# Patient Record
Sex: Female | Born: 1935 | Race: Black or African American | Hispanic: No | Smoking: Never smoker
Health system: Southern US, Community
[De-identification: ages and names within clinical notes are randomized; demographics above are authoritative.]

## PROBLEM LIST (undated history)

## (undated) DIAGNOSIS — M1990 Unspecified osteoarthritis, unspecified site: Secondary | ICD-10-CM

## (undated) DIAGNOSIS — Z950 Presence of cardiac pacemaker: Secondary | ICD-10-CM

## (undated) DIAGNOSIS — F0390 Unspecified dementia without behavioral disturbance: Secondary | ICD-10-CM

## (undated) HISTORY — PX: CARPAL TUNNEL RELEASE: SHX101

## (undated) HISTORY — DX: Unspecified osteoarthritis, unspecified site: M19.90

## (undated) HISTORY — DX: Presence of cardiac pacemaker: Z95.0

## (undated) HISTORY — PX: KNEE ARTHROPLASTY: SHX992

---

## 2010-05-20 HISTORY — PX: ATRIAL CARDIAC PACEMAKER INSERTION: SHX561

## 2010-12-27 DIAGNOSIS — F0390 Unspecified dementia without behavioral disturbance: Secondary | ICD-10-CM | POA: Insufficient documentation

## 2011-05-21 HISTORY — PX: BRAIN SURGERY: SHX531

## 2013-05-06 DIAGNOSIS — I495 Sick sinus syndrome: Secondary | ICD-10-CM | POA: Insufficient documentation

## 2014-04-17 ENCOUNTER — Emergency Department (HOSPITAL_COMMUNITY): Payer: Medicare Other

## 2014-04-17 ENCOUNTER — Emergency Department (HOSPITAL_COMMUNITY)
Admission: EM | Admit: 2014-04-17 | Discharge: 2014-04-18 | Disposition: A | Discharge status: Home / Self Care | Payer: Medicare Other | Attending: Emergency Medicine | Admitting: Emergency Medicine

## 2014-04-17 ENCOUNTER — Encounter (HOSPITAL_COMMUNITY): Payer: Self-pay | Admitting: Emergency Medicine

## 2014-04-17 DIAGNOSIS — S0990XA Unspecified injury of head, initial encounter: Secondary | ICD-10-CM | POA: Diagnosis present

## 2014-04-17 DIAGNOSIS — S0093XA Contusion of unspecified part of head, initial encounter: Secondary | ICD-10-CM | POA: Diagnosis not present

## 2014-04-17 DIAGNOSIS — Y92128 Other place in nursing home as the place of occurrence of the external cause: Secondary | ICD-10-CM | POA: Insufficient documentation

## 2014-04-17 DIAGNOSIS — Z950 Presence of cardiac pacemaker: Secondary | ICD-10-CM | POA: Insufficient documentation

## 2014-04-17 DIAGNOSIS — Y998 Other external cause status: Secondary | ICD-10-CM | POA: Diagnosis not present

## 2014-04-17 DIAGNOSIS — Y9389 Activity, other specified: Secondary | ICD-10-CM | POA: Diagnosis not present

## 2014-04-17 DIAGNOSIS — F0390 Unspecified dementia without behavioral disturbance: Secondary | ICD-10-CM | POA: Insufficient documentation

## 2014-04-17 DIAGNOSIS — Z79899 Other long term (current) drug therapy: Secondary | ICD-10-CM | POA: Insufficient documentation

## 2014-04-17 DIAGNOSIS — W19XXXA Unspecified fall, initial encounter: Secondary | ICD-10-CM | POA: Diagnosis not present

## 2014-04-17 HISTORY — DX: Unspecified dementia, unspecified severity, without behavioral disturbance, psychotic disturbance, mood disturbance, and anxiety: F03.90

## 2014-04-17 LAB — URINALYSIS, ROUTINE W REFLEX MICROSCOPIC
BILIRUBIN URINE: NEGATIVE
Glucose, UA: NEGATIVE mg/dL
Hgb urine dipstick: NEGATIVE
KETONES UR: NEGATIVE mg/dL
Leukocytes, UA: NEGATIVE
Nitrite: NEGATIVE
PROTEIN: NEGATIVE mg/dL
Specific Gravity, Urine: 1.021 (ref 1.005–1.030)
Urobilinogen, UA: 1 mg/dL (ref 0.0–1.0)
pH: 6 (ref 5.0–8.0)

## 2014-04-17 LAB — DIFFERENTIAL
BASOS PCT: 0 % (ref 0–1)
Basophils Absolute: 0 10*3/uL (ref 0.0–0.1)
EOS ABS: 0.1 10*3/uL (ref 0.0–0.7)
Eosinophils Relative: 2 % (ref 0–5)
Lymphocytes Relative: 23 % (ref 12–46)
Lymphs Abs: 1.3 10*3/uL (ref 0.7–4.0)
MONOS PCT: 10 % (ref 3–12)
Monocytes Absolute: 0.6 10*3/uL (ref 0.1–1.0)
NEUTROS PCT: 65 % (ref 43–77)
Neutro Abs: 3.7 10*3/uL (ref 1.7–7.7)

## 2014-04-17 LAB — BASIC METABOLIC PANEL
Anion gap: 12 (ref 5–15)
BUN: 30 mg/dL — AB (ref 6–23)
CHLORIDE: 97 meq/L (ref 96–112)
CO2: 25 mEq/L (ref 19–32)
CREATININE: 1 mg/dL (ref 0.50–1.10)
Calcium: 9.2 mg/dL (ref 8.4–10.5)
GFR, EST AFRICAN AMERICAN: 56 mL/min — AB (ref >90)
GFR, EST NON AFRICAN AMERICAN: 48 mL/min — AB (ref >90)
Glucose, Bld: 118 mg/dL — ABNORMAL HIGH (ref 70–99)
Potassium: 4.4 mEq/L (ref 3.7–5.3)
Sodium: 134 mEq/L — ABNORMAL LOW (ref 137–147)

## 2014-04-17 LAB — CBC
HCT: 35.6 % — ABNORMAL LOW (ref 36.0–46.0)
HEMOGLOBIN: 11.4 g/dL — AB (ref 12.0–15.0)
MCH: 30.7 pg (ref 26.0–34.0)
MCHC: 32 g/dL (ref 30.0–36.0)
MCV: 96 fL (ref 78.0–100.0)
Platelets: 199 10*3/uL (ref 150–400)
RBC: 3.71 MIL/uL — ABNORMAL LOW (ref 3.87–5.11)
RDW: 14.3 % (ref 11.5–15.5)
WBC: 5.8 10*3/uL (ref 4.0–10.5)

## 2014-04-17 LAB — TROPONIN I: Troponin I: <0.3 ng/mL (ref <0.30)

## 2014-04-17 NOTE — ED Notes (Signed)
Patient has 2" skin tear to left forearm. Cleaned with wound cleanser, non adherent dressing and gauze applied.

## 2014-04-17 NOTE — ED Notes (Addendum)
[facility]- Per EMS, pt had unwitnessed fall today. Per facility, pt was found sitting on the floor. Pt denies pain or injury. Pt has no evidence of injury or trauma. Pt sent for eval per policy. Pt does not take blood thinners. Pt at baseline per staff. Pt in NAD

## 2014-04-17 NOTE — ED Notes (Signed)
Called [facility] for update on ETA. Dispatcher unable to give ETA at this time. "[facility] calls to [facility]. I can't see where patient is on list". Patient made aware of same.

## 2014-04-17 NOTE — Discharge Instructions (Signed)
Fall Prevention and Home Safety °Falls cause injuries and can affect all age groups. It is possible to use preventive measures to significantly decrease the likelihood of falls. There are many simple measures which can make your home safer and prevent falls. °OUTDOORS °· Repair cracks and edges of walkways and driveways. °· Remove high doorway thresholds. °· Trim shrubbery on the main path into your home. °· Have good outside lighting. °· Clear walkways of tools, rocks, debris, and clutter. °· Check that handrails are not broken and are securely fastened. Both sides of steps should have handrails. °· Have leaves, snow, and ice cleared regularly. °· Use sand or salt on walkways during winter months. °· In the garage, clean up grease or oil spills. °BATHROOM °· Install night lights. °· Install grab bars by the toilet and in the tub and shower. °· Use non-skid mats or decals in the tub or shower. °· Place a plastic non-slip stool in the shower to sit on, if needed. °· Keep floors dry and clean up all water on the floor immediately. °· Remove soap buildup in the tub or shower on a regular basis. °· Secure bath mats with non-slip, double-sided rug tape. °· Remove throw rugs and tripping hazards from the floors. °BEDROOMS °· Install night lights. °· Make sure a bedside light is easy to reach. °· Do not use oversized bedding. °· Keep a telephone by your bedside. °· Have a firm chair with side arms to use for getting dressed. °· Remove throw rugs and tripping hazards from the floor. °KITCHEN °· Keep handles on pots and pans turned toward the center of the stove. Use back burners when possible. °· Clean up spills quickly and allow time for drying. °· Avoid walking on wet floors. °· Avoid hot utensils and knives. °· Position shelves so they are not too high or low. °· Place commonly used objects within easy reach. °· If necessary, use a sturdy step stool with a grab bar when reaching. °· Keep electrical cables out of the  way. °· Do not use floor polish or wax that makes floors slippery. If you must use wax, use non-skid floor wax. °· Remove throw rugs and tripping hazards from the floor. °STAIRWAYS °· Never leave objects on stairs. °· Place handrails on both sides of stairways and use them. Fix any loose handrails. Make sure handrails on both sides of the stairways are as long as the stairs. °· Check carpeting to make sure it is firmly attached along stairs. Make repairs to worn or loose carpet promptly. °· Avoid placing throw rugs at the top or bottom of stairways, or properly secure the rug with carpet tape to prevent slippage. Get rid of throw rugs, if possible. °· Have an electrician put in a light switch at the top and bottom of the stairs. °OTHER FALL PREVENTION TIPS °· Wear low-heel or rubber-soled shoes that are supportive and fit well. Wear closed toe shoes. °· When using a stepladder, make sure it is fully opened and both spreaders are firmly locked. Do not climb a closed stepladder. °· Add color or contrast paint or tape to grab bars and handrails in your home. Place contrasting color strips on first and last steps. °· Learn and use mobility aids as needed. Install an electrical emergency response system. °· Turn on lights to avoid dark areas. Replace light bulbs that burn out immediately. Get light switches that glow. °· Arrange furniture to create clear pathways. Keep furniture in the same place. °·   Firmly attach carpet with non-skid or double-sided tape. °· Eliminate uneven floor surfaces. °· Select a carpet pattern that does not visually hide the edge of steps. °· Be aware of all pets. °OTHER HOME SAFETY TIPS °· Set the water temperature for 120° F (48.8° C). °· Keep emergency numbers on or near the telephone. °· Keep smoke detectors on every level of the home and near sleeping areas. °Document Released: 04/26/2002 Document Revised: 11/05/2011 Document Reviewed: 07/26/2011 °Exit[employer]® Patient Information ©2015  [employer]. This information is not intended to replace advice given to you by your health care provider. Make sure you discuss any questions you have with your health care provider. ° °Facial or Scalp Contusion °A facial or scalp contusion is a deep bruise on the face or head. Injuries to the face and head generally cause a lot of swelling, especially around the eyes. Contusions are the result of an injury that caused bleeding under the skin. The contusion may turn blue, purple, or yellow. Minor injuries will give you a painless contusion, but more severe contusions may stay painful and swollen for a few weeks.  °CAUSES  °A facial or scalp contusion is caused by a blunt injury or trauma to the face or head area.  °SIGNS AND SYMPTOMS  °· Swelling of the injured area.   °· Discoloration of the injured area.   °· Tenderness, soreness, or pain in the injured area.   °DIAGNOSIS  °The diagnosis can be made by taking a medical history and doing a physical exam. An X-ray exam, CT scan, or MRI may be needed to determine if there are any associated injuries, such as broken bones (fractures). °TREATMENT  °Often, the best treatment for a facial or scalp contusion is applying cold compresses to the injured area. Over-the-counter medicines may also be recommended for pain control.  °HOME CARE INSTRUCTIONS  °· Only take over-the-counter or prescription medicines as directed by your health care provider.   °· Apply ice to the injured area.   °¨ Put ice in a plastic bag.   °¨ Place a towel between your skin and the bag.   °¨ Leave the ice on for 20 minutes, 2-3 times a day.   °SEEK MEDICAL CARE IF: °· You have bite problems.   °· You have pain with chewing.   °· You are concerned about facial defects. °SEEK IMMEDIATE MEDICAL CARE IF: °· You have severe pain or a headache that is not relieved by medicine.   °· You have unusual sleepiness, confusion, or personality changes.   °· You throw up (vomit).   °· You have a persistent  nosebleed.   °· You have double vision or blurred vision.   °· You have fluid drainage from your nose or ear.   °· You have difficulty walking or using your arms or legs.   °MAKE SURE YOU:  °· Understand these instructions. °· Will watch your condition. °· Will get help right away if you are not doing well or get worse. °Document Released: 06/13/2004 Document Revised: 02/24/2013 Document Reviewed: 12/17/2012 °ExitCare® Patient Information ©2015 [employer]. This information is not intended to replace advice given to you by your health care provider. Make sure you discuss any questions you have with your health care provider. ° °

## 2014-04-17 NOTE — ED Provider Notes (Signed)
CSN: [id number]     Arrival date & time 04/17/14  1508 History   First MD Initiated Contact with Patient 04/17/14 1543     Chief Complaint  Patient presents with  . Fall     (Consider location/radiation/quality/duration/timing/severity/associated sxs/prior Treatment) HPI Comments: 80 yo female with hx of dementia who presents after a fall while at her nursing facility.  Unwitnessed.  Pt denies complaint currently.  Her granddaughter states she is more confused than she normally is.  For example, she thought her granddaughter was her daughter and she did not remember her grandson's name.  No fevers, no cough, no chest pain, no SOB.    Patient is a 80 y.o. female presenting with fall.  Fall This is a new problem. Episode onset: today. Episode frequency: once. The problem has been resolved. Associated symptoms comments: Headache, confusion. Nothing aggravates the symptoms. Nothing relieves the symptoms.    Past Medical History  Diagnosis Date  . Dementia    Past Surgical History  Procedure Laterality Date  . Atrial cardiac pacemaker insertion    . Knee arthroplasty Bilateral   . Carpal tunnel release Bilateral    No family history on file. History  Substance Use Topics  . Smoking status: Never Smoker   . Smokeless tobacco: Never Used  . Alcohol Use: No   OB History    No data available     Review of Systems  All other systems reviewed and are negative.     Allergies  Benzodiazepines  Home Medications   Prior to Admission medications   Medication Sig Start Date End Date Taking? Authorizing Provider  ALPRAZolam (XANAX) 0.25 MG tablet Take 0.25 mg by mouth every 6 (six) hours as needed for anxiety (agitation).   Yes Historical Provider, MD  cholecalciferol (VITAMIN D) 1000 UNITS tablet Take 1,000 Units by mouth daily.   Yes Historical Provider, MD  clotrimazole (LOTRIMIN) 1 % cream Apply 1 application topically 2 (two) times daily.   Yes Historical Provider, MD    Memantine HCl ER (NAMENDA XR) 28 MG CP24 Take 28 mg by mouth daily.   Yes Historical Provider, MD  sertraline (ZOLOFT) 25 MG tablet Take 25 mg by mouth daily.   Yes Historical Provider, MD   BP 129/65 mmHg  Pulse 68  Temp(Src) 98.2 F (36.8 C) (Oral)  Resp 18  SpO2 96% Physical Exam  Constitutional: She is oriented to person, place, and time. She appears well-developed and well-nourished. No distress.  HENT:  Head: Normocephalic and atraumatic.    Mouth/Throat: Oropharynx is clear and moist.  Eyes: Conjunctivae are normal. Pupils are equal, round, and reactive to light. No scleral icterus.  Neck: Neck supple.  Cardiovascular: Normal rate, regular rhythm, normal heart sounds and intact distal pulses.   No murmur heard. Pulmonary/Chest: Effort normal and breath sounds normal. No stridor. No respiratory distress. She has no rales.  Abdominal: Soft. Bowel sounds are normal. She exhibits no distension. There is no tenderness.  Musculoskeletal: Normal range of motion.  Neurological: She is alert and oriented to person, place, and time.  Skin: Skin is warm and dry. No rash noted.     Psychiatric: She has a normal mood and affect. Her behavior is normal.  Nursing note and vitals reviewed.   ED Course  Procedures (including critical care time) Labs Review Labs Reviewed  BASIC METABOLIC PANEL - Abnormal; Notable for the following:    Sodium 134 (*)    Glucose, Bld 118 (*)  BUN 30 (*)    GFR calc non Af Amer 48 (*)    GFR calc Af Amer 56 (*)    All other components within normal limits  CBC - Abnormal; Notable for the following:    RBC 3.71 (*)    Hemoglobin 11.4 (*)    HCT 35.6 (*)    All other components within normal limits  URINALYSIS, ROUTINE W REFLEX MICROSCOPIC  DIFFERENTIAL  TROPONIN I  CBC WITH DIFFERENTIAL    Imaging Review Ct Head Wo Contrast  04/17/2014   CLINICAL DATA:  Unwitnessed fall today at a nursing facility. Found sitting on the floor. No pain or  visible trauma.  EXAM: CT HEAD WITHOUT CONTRAST  CT CERVICAL SPINE WITHOUT CONTRAST  TECHNIQUE: Multidetector CT imaging of the head and cervical spine was performed following the standard protocol without intravenous contrast. Multiplanar CT image reconstructions of the cervical spine were also generated.  COMPARISON:  None.  FINDINGS: CT HEAD FINDINGS  Diffusely enlarged ventricles and subarachnoid spaces. Patchy white matter low density in both cerebral hemispheres. Small left posterolateral water density hygroma, with a maximum thickness of 6 mm. Small amount of punctate calcific density in the anterior aspects of both middle cranial fossae and in the sella turcica. Small amount of similar density in the left posterior fossa. No skull fracture, intracranial hemorrhage or paranasal sinus air-fluid levels. Mild right maxillary sinus mucosal thickening. Left frontal bone burr hole.  CT CERVICAL SPINE FINDINGS  Mild dextroconvex cervical scoliosis. Degenerative changes throughout the cervical and upper thoracic spine. These include extensive facet degenerative changes with associated grade 1 retrolisthesis at the C3-4 level and C4-5 level and grade 1 anterolisthesis at the C6-7 and C7-T1 levels. No prevertebral soft tissue swelling or fractures.  IMPRESSION: 1. No skull fracture or intracranial hemorrhage. 2. No cervical spine fracture or traumatic subluxation. 3. Moderate diffuse cerebral and cerebellar atrophy. 4. Moderate chronic small vessel white matter ischemic changes in both cerebral hemispheres. 5. 6 mm thick left posterolateral subdural hygroma and left frontal burr hole. 6. Cervical spine degenerative changes, as described above   Electronically Signed   By: Gordan Payment M.D.   On: 04/17/2014 17:54   Ct Cervical Spine Wo Contrast  04/17/2014   CLINICAL DATA:  Unwitnessed fall today at a nursing facility. Found sitting on the floor. No pain or visible trauma.  EXAM: CT HEAD WITHOUT CONTRAST  CT CERVICAL  SPINE WITHOUT CONTRAST  TECHNIQUE: Multidetector CT imaging of the head and cervical spine was performed following the standard protocol without intravenous contrast. Multiplanar CT image reconstructions of the cervical spine were also generated.  COMPARISON:  None.  FINDINGS: CT HEAD FINDINGS  Diffusely enlarged ventricles and subarachnoid spaces. Patchy white matter low density in both cerebral hemispheres. Small left posterolateral water density hygroma, with a maximum thickness of 6 mm. Small amount of punctate calcific density in the anterior aspects of both middle cranial fossae and in the sella turcica. Small amount of similar density in the left posterior fossa. No skull fracture, intracranial hemorrhage or paranasal sinus air-fluid levels. Mild right maxillary sinus mucosal thickening. Left frontal bone burr hole.  CT CERVICAL SPINE FINDINGS  Mild dextroconvex cervical scoliosis. Degenerative changes throughout the cervical and upper thoracic spine. These include extensive facet degenerative changes with associated grade 1 retrolisthesis at the C3-4 level and C4-5 level and grade 1 anterolisthesis at the C6-7 and C7-T1 levels. No prevertebral soft tissue swelling or fractures.  IMPRESSION: 1. No skull fracture or  intracranial hemorrhage. 2. No cervical spine fracture or traumatic subluxation. 3. Moderate diffuse cerebral and cerebellar atrophy. 4. Moderate chronic small vessel white matter ischemic changes in both cerebral hemispheres. 5. 6 mm thick left posterolateral subdural hygroma and left frontal burr hole. 6. Cervical spine degenerative changes, as described above   Electronically Signed   By: Gordan PaymentSteve  Reid M.D.   On: 04/17/2014 17:54  All radiology studies independently viewed by me.      EKG Interpretation   Date/Time:  Sunday April 17 2014 15:20:54 EST Ventricular Rate:  62 PR Interval:  154 QRS Duration: 122 QT Interval:  439 QTC Calculation: 446 R Axis:   -5 Text Interpretation:   Sinus rhythm Right bundle branch block No old  tracing to compare Confirmed by [facility]  MD, TREY ([id number]) on 04/17/2014  4:34:30 PM      MDM   Final diagnoses:  Fall  Head contusion, initial encounter    80 yo female who presented after an unwitnessed fall at her nursing facility.  She complained of headache and her granddaughter felt that she was more confused than usual.  Her imaging was negative for acute intracranial or cervical spine injuries.  Her blood work and urinalysis were reassuring.  Her repeat exams were reassuring.  She was able to walk with a walker.  She appeared stable for discharge back to her nursing facility.  I discussed return precautions with her granddaughter.     Warnell Foresterrey Dornell Grasmick, MD 04/18/14 [phone]

## 2014-04-17 NOTE — ED Notes (Signed)
Pt asking if mom is aware she is here, pt seems confused

## 2014-08-24 ENCOUNTER — Emergency Department (HOSPITAL_BASED_OUTPATIENT_CLINIC_OR_DEPARTMENT_OTHER)
Admission: EM | Admit: 2014-08-24 | Discharge: 2014-08-24 | Disposition: A | Discharge status: Home / Self Care | Payer: Medicare Other | Attending: Emergency Medicine | Admitting: Emergency Medicine

## 2014-08-24 ENCOUNTER — Encounter (HOSPITAL_BASED_OUTPATIENT_CLINIC_OR_DEPARTMENT_OTHER): Payer: Self-pay

## 2014-08-24 ENCOUNTER — Emergency Department (HOSPITAL_BASED_OUTPATIENT_CLINIC_OR_DEPARTMENT_OTHER): Payer: Medicare Other

## 2014-08-24 DIAGNOSIS — K6289 Other specified diseases of anus and rectum: Secondary | ICD-10-CM

## 2014-08-24 DIAGNOSIS — K648 Other hemorrhoids: Secondary | ICD-10-CM | POA: Diagnosis not present

## 2014-08-24 DIAGNOSIS — Z79899 Other long term (current) drug therapy: Secondary | ICD-10-CM | POA: Diagnosis not present

## 2014-08-24 DIAGNOSIS — Z950 Presence of cardiac pacemaker: Secondary | ICD-10-CM | POA: Insufficient documentation

## 2014-08-24 DIAGNOSIS — F0390 Unspecified dementia without behavioral disturbance: Secondary | ICD-10-CM | POA: Diagnosis not present

## 2014-08-24 LAB — COMPREHENSIVE METABOLIC PANEL
ALT: 9 U/L (ref 0–35)
AST: 17 U/L (ref 0–37)
Albumin: 3.3 g/dL — ABNORMAL LOW (ref 3.5–5.2)
Alkaline Phosphatase: 83 U/L (ref 39–117)
Anion gap: 7 (ref 5–15)
BILIRUBIN TOTAL: 0.5 mg/dL (ref 0.3–1.2)
BUN: 25 mg/dL — ABNORMAL HIGH (ref 6–23)
CHLORIDE: 105 mmol/L (ref 96–112)
CO2: 28 mmol/L (ref 19–32)
CREATININE: 0.77 mg/dL (ref 0.50–1.10)
Calcium: 8.5 mg/dL (ref 8.4–10.5)
GFR, EST AFRICAN AMERICAN: 83 mL/min — AB (ref >90)
GFR, EST NON AFRICAN AMERICAN: 71 mL/min — AB (ref >90)
GLUCOSE: 89 mg/dL (ref 70–99)
Potassium: 3.7 mmol/L (ref 3.5–5.1)
Sodium: 140 mmol/L (ref 135–145)
Total Protein: 6.4 g/dL (ref 6.0–8.3)

## 2014-08-24 LAB — CBC WITH DIFFERENTIAL/PLATELET
BASOS ABS: 0 10*3/uL (ref 0.0–0.1)
Basophils Relative: 1 % (ref 0–1)
EOS PCT: 2 % (ref 0–5)
Eosinophils Absolute: 0.1 10*3/uL (ref 0.0–0.7)
HEMATOCRIT: 33.3 % — AB (ref 36.0–46.0)
Hemoglobin: 10.5 g/dL — ABNORMAL LOW (ref 12.0–15.0)
Lymphocytes Relative: 35 % (ref 12–46)
Lymphs Abs: 1.7 10*3/uL (ref 0.7–4.0)
MCH: 30.4 pg (ref 26.0–34.0)
MCHC: 31.5 g/dL (ref 30.0–36.0)
MCV: 96.5 fL (ref 78.0–100.0)
Monocytes Absolute: 0.5 10*3/uL (ref 0.1–1.0)
Monocytes Relative: 10 % (ref 3–12)
NEUTROS ABS: 2.5 10*3/uL (ref 1.7–7.7)
Neutrophils Relative %: 52 % (ref 43–77)
Platelets: 154 10*3/uL (ref 150–400)
RBC: 3.45 MIL/uL — ABNORMAL LOW (ref 3.87–5.11)
RDW: 15.2 % (ref 11.5–15.5)
WBC: 4.8 10*3/uL (ref 4.0–10.5)

## 2014-08-24 LAB — URINALYSIS, ROUTINE W REFLEX MICROSCOPIC
Bilirubin Urine: NEGATIVE
Glucose, UA: NEGATIVE mg/dL
Hgb urine dipstick: NEGATIVE
Ketones, ur: 15 mg/dL — AB
Leukocytes, UA: NEGATIVE
NITRITE: NEGATIVE
Protein, ur: NEGATIVE mg/dL
Specific Gravity, Urine: 1.019 (ref 1.005–1.030)
UROBILINOGEN UA: 0.2 mg/dL (ref 0.0–1.0)
pH: 7 (ref 5.0–8.0)

## 2014-08-24 LAB — LIPASE, BLOOD: Lipase: 17 U/L (ref 11–59)

## 2014-08-24 MED ORDER — ONDANSETRON HCL 4 MG/2ML IJ SOLN: 4.0000 mg | Freq: Once | INTRAMUSCULAR | Status: DC

## 2014-08-24 MED ORDER — SODIUM CHLORIDE 0.9 % IV SOLN: INTRAVENOUS | Status: DC

## 2014-08-24 MED ORDER — CIPROFLOXACIN HCL 250 MG PO TABS: 250.0000 mg | ORAL_TABLET | Freq: Two times a day (BID) | ORAL | Status: DC

## 2014-08-24 MED ORDER — METRONIDAZOLE 500 MG PO TABS: 500.0000 mg | ORAL_TABLET | Freq: Two times a day (BID) | ORAL | Status: DC

## 2014-08-24 MED ORDER — TRAMADOL HCL 50 MG PO TABS: 50.0000 mg | ORAL_TABLET | Freq: Four times a day (QID) | ORAL | Status: DC | PRN

## 2014-08-24 MED ORDER — IOHEXOL 300 MG/ML  SOLN
100.0000 mL | Freq: Once | INTRAMUSCULAR | Status: AC | PRN
  Administered 2014-08-24: 100 mL via INTRAVENOUS

## 2014-08-24 MED ORDER — SODIUM CHLORIDE 0.9 % IV BOLUS (SEPSIS)
250.0000 mL | Freq: Once | INTRAVENOUS | Status: AC
  Administered 2014-08-24: 250 mL via INTRAVENOUS

## 2014-08-24 MED ORDER — IOHEXOL 300 MG/ML  SOLN
50.0000 mL | Freq: Once | INTRAMUSCULAR | Status: AC | PRN
  Administered 2014-08-24: 50 mL via ORAL

## 2014-08-24 NOTE — ED Notes (Signed)
Pt drinking PO contrast

## 2014-08-24 NOTE — Discharge Instructions (Signed)
Follow-up with her geriatric doctor as scheduled. Return for any new or worse symptoms. Take pain medicine as needed.

## 2014-08-24 NOTE — ED Provider Notes (Signed)
CT scan shows proctitis. We'll treat with antibiotics. Refer to GI. Family aware that patient needs colonoscopy as this may represent tumor as well.   Sherri OctaveStephen Normand Damron, MD 08/24/14 [phone]

## 2014-08-24 NOTE — ED Notes (Signed)
Onset of buttock pain 6 days ago and progressively worsening.  Caregiver reports she attended an adult day care, complained to staff that her "butt hurt". Denies recent fall or injury, bowel or bladder incontinence, however at one point stated pain radiated down her leg.  Normal BM.  She was evaluated and [facility] ago with a normal exam/ diagnostics.  Caregiver also reports urinary frequency yesterday and today.

## 2014-08-24 NOTE — ED Provider Notes (Signed)
CSN: [id number]     Arrival date & time 08/24/14  1008 History   First MD Initiated Contact with Patient 08/24/14 1124     Chief Complaint  Patient presents with  . Rectal Pain     (Consider location/radiation/quality/duration/timing/severity/associated sxs/prior Treatment) The history is provided by a relative. The history is limited by the condition of the patient.   level V caveat applies to the patient's history she has significant dementia. All information provided by patient's granddaughter.  Patient with onset of complaint of rectal pain on Thursday. The pain is worse with sitting and with standing but is able to walk without pain. Able to lay down without pain. No history of any fall or injury. Patient was evaluated at [facility] emergency department on Saturday had rectal exam which is noted to have stool impaction. Acute abdominal series was done without evidence of any bony abnormalities to the back or any significant accumulation of stool or evidence of any bowel obstruction.  Since that time patient is pain has persisted family is not sure why. In addition they've noted that she's had urinary frequency the past 2 days.  Past Medical History  Diagnosis Date  . Dementia    Past Surgical History  Procedure Laterality Date  . Atrial cardiac pacemaker insertion    . Knee arthroplasty Bilateral   . Carpal tunnel release Bilateral    No family history on file. History  Substance Use Topics  . Smoking status: Never Smoker   . Smokeless tobacco: Never Used  . Alcohol Use: No   OB History    No data available     Review of Systems  Unable to perform ROS HENT: Negative.   Gastrointestinal: Positive for rectal pain.  Psychiatric/Behavioral: Positive for confusion.   05 caveat applies to the review of systems due to significant dementia.    Allergies  Benzodiazepines  Home Medications   Prior to Admission medications   Medication Sig Start Date End Date Taking?  Authorizing Provider  cholecalciferol (VITAMIN D) 1000 UNITS tablet Take 1,000 Units by mouth daily.    Historical Provider, MD  clotrimazole (LOTRIMIN) 1 % cream Apply 1 application topically 2 (two) times daily.    Historical Provider, MD  Memantine HCl ER (NAMENDA XR) 28 MG CP24 Take 28 mg by mouth daily.    Historical Provider, MD  sertraline (ZOLOFT) 25 MG tablet Take 25 mg by mouth daily.    Historical Provider, MD   BP 156/54 mmHg  Pulse 75  Temp(Src) 98 F (36.7 C) (Oral)  Resp 16  Ht  (1.651 m)  Wt 125 lb (56.7 kg)  BMI 20.80 kg/m2  SpO2 99% Physical Exam  Constitutional: She is oriented to person, place, and time. She appears well-developed and well-nourished. No distress.  HENT:  Head: Normocephalic and atraumatic.  Mouth/Throat: Oropharynx is clear and moist.  Eyes: Conjunctivae and EOM are normal. Pupils are equal, round, and reactive to light.  Neck: Normal range of motion.  Cardiovascular: Normal rate, regular rhythm and normal heart sounds.   No murmur heard. Pulmonary/Chest: Effort normal and breath sounds normal. No respiratory distress.  Abdominal: Soft. Bowel sounds are normal. There is no tenderness.  Genitourinary: No vaginal discharge found.  Rectal exam no mass no fecal impaction. No pain. No gross blood. Single prolapsed internal hemorrhoid without bleeding. No external hemorrhoids no fissure.  Musculoskeletal: Normal range of motion.  Neurological: She is alert and oriented to person, place, and time. No cranial nerve deficit.  She exhibits normal muscle tone. Coordination normal.  Skin: Skin is warm. No rash noted.  Nursing note and vitals reviewed.   ED Course  Procedures (including critical care time) Labs Review Labs Reviewed  URINALYSIS, ROUTINE W REFLEX MICROSCOPIC - Abnormal; Notable for the following:    APPearance CLOUDY (*)    Ketones, ur 15 (*)    All other components within normal limits  COMPREHENSIVE METABOLIC PANEL - Abnormal;  Notable for the following:    BUN 25 (*)    Albumin 3.3 (*)    GFR calc non Af Amer 71 (*)    GFR calc Af Amer 83 (*)    All other components within normal limits  CBC WITH DIFFERENTIAL/PLATELET - Abnormal; Notable for the following:    RBC 3.45 (*)    Hemoglobin 10.5 (*)    HCT 33.3 (*)    All other components within normal limits  LIPASE, BLOOD   Results for orders placed or performed during the hospital encounter of 08/24/14  Urinalysis, Routine w reflex microscopic  Result Value Ref Range   Color, Urine YELLOW YELLOW   APPearance CLOUDY (A) CLEAR   Specific Gravity, Urine 1.019 1.005 - 1.030   pH 7.0 5.0 - 8.0   Glucose, UA NEGATIVE NEGATIVE mg/dL   Hgb urine dipstick NEGATIVE NEGATIVE   Bilirubin Urine NEGATIVE NEGATIVE   Ketones, ur 15 (A) NEGATIVE mg/dL   Protein, ur NEGATIVE NEGATIVE mg/dL   Urobilinogen, UA 0.2 0.0 - 1.0 mg/dL   Nitrite NEGATIVE NEGATIVE   Leukocytes, UA NEGATIVE NEGATIVE  Comprehensive metabolic panel  Result Value Ref Range   Sodium 140 135 - 145 mmol/L   Potassium 3.7 3.5 - 5.1 mmol/L   Chloride 105 96 - 112 mmol/L   CO2 28 19 - 32 mmol/L   Glucose, Bld 89 70 - 99 mg/dL   BUN 25 (H) 6 - 23 mg/dL   Creatinine, Ser 1.610.77 0.50 - 1.10 mg/dL   Calcium 8.5 8.4 - 09.610.5 mg/dL   Total Protein 6.4 6.0 - 8.3 g/dL   Albumin 3.3 (L) 3.5 - 5.2 g/dL   AST 17 0 - 37 U/L   ALT 9 0 - 35 U/L   Alkaline Phosphatase 83 39 - 117 U/L   Total Bilirubin 0.5 0.3 - 1.2 mg/dL   GFR calc non Af Amer 71 (L) >90 mL/min   GFR calc Af Amer 83 (L) >90 mL/min   Anion gap 7 5 - 15  Lipase, blood  Result Value Ref Range   Lipase 17 11 - 59 U/L  CBC with Differential/Platelet  Result Value Ref Range   WBC 4.8 4.0 - 10.5 K/uL   RBC 3.45 (L) 3.87 - 5.11 MIL/uL   Hemoglobin 10.5 (L) 12.0 - 15.0 g/dL   HCT 04.533.3 (L) 40.936.0 - 81.146.0 %   MCV 96.5 78.0 - 100.0 fL   MCH 30.4 26.0 - 34.0 pg   MCHC 31.5 30.0 - 36.0 g/dL   RDW 91.415.2 78.211.5 - 95.615.5 %   Platelets 154 150 - 400 K/uL    Neutrophils Relative % 52 43 - 77 %   Neutro Abs 2.5 1.7 - 7.7 K/uL   Lymphocytes Relative 35 12 - 46 %   Lymphs Abs 1.7 0.7 - 4.0 K/uL   Monocytes Relative 10 3 - 12 %   Monocytes Absolute 0.5 0.1 - 1.0 K/uL   Eosinophils Relative 2 0 - 5 %   Eosinophils Absolute 0.1 0.0 - 0.7 K/uL  Basophils Relative 1 0 - 1 %   Basophils Absolute 0.0 0.0 - 0.1 K/uL  \  Imaging Review No results found.   EKG Interpretation None      MDM   Final diagnoses:  Rectal pain    Patient with almost one-week history of complaint of pain in the rectal area. Worse with standing or sitting but will walk without pain. Also will lay flat without complaint of pain. No history of any fall. Patient does have significant dementia. Patient brought in by granddaughter who is her caretaker.  Patient was evaluated at [facility] ED on Saturday notes from there were obtained. Patient had acute abdominal series which was negative for any significant findings. No evidence of any bowel obstruction or any significant constipation. However all rectal exam they stated that she was impacted. Patient was not sent home with any stool softeners. Was sent home with follow-up with her primary care doctor but unfortunately she no longer followed by that doctor. Patient continued to have the pain.  Here today of rectal vault is no stool in it. Some evidence of a little bit of a prolapsed internal hemorrhoid no external hemorrhoids no fissure. No pain on rectal examination.  Patient lab workup without significant abnormalities no leukocytosis no liver function test and a mallet lipase is normal. Urinalysis is negative family members stated that seemed to be having urinary frequency of the past few days and that the odor with strong. But no evidence of urinary tract infection today.  CT scan is pending for evaluation of the pelvic area will also evaluate the pelvis and the hip bones as well.  If negative patient can be discharged  home with pain control. Will write a prescription for tramadol.  Patient's family member is working on finding her geriatric doctor she does have follow-up in April with them.    Vanetta Mulders, MD 08/24/14 1539

## 2014-08-24 NOTE — ED Notes (Signed)
Pt returned from CT.  Pt in NAD.

## 2014-08-24 NOTE — ED Notes (Signed)
Unable to obtain urine specimen, EDP informed.

## 2014-08-24 NOTE — ED Notes (Signed)
MD at bedside. 

## 2014-08-24 NOTE — ED Notes (Signed)
Family at bedside. 

## 2014-08-25 ENCOUNTER — Telehealth: Payer: Self-pay | Admitting: Internal Medicine

## 2014-08-25 ENCOUNTER — Telehealth (HOSPITAL_BASED_OUTPATIENT_CLINIC_OR_DEPARTMENT_OTHER): Payer: Self-pay | Admitting: *Deleted

## 2014-08-25 NOTE — Telephone Encounter (Signed)
Pt was seen in the ER for rectal pain. Pt was diagnosed with proctitis and told to follow-up with GI for possible colonoscopy. Pt scheduled to see Willette ClusterPaula Guenther NP 08/30/14@1 :30pm. Pt aware of appt.

## 2014-08-29 NOTE — ED Notes (Signed)
Granddaughter of patient called to state that the patient is still in pain and is concerned that she did not get in to see the GI doctor in 2 days.  Reinforced the discharge instructions and the use of antibiotics to decrease the inflammation to improve the visual ability for the GI doctor.  Reviewed pain medication instructions and encouraged to supplement with additional tylenol bid, as well as use of hot water bottle to the lower abdomen.  Encouraged to come back to the ed if pain persist or feels any new symptoms.

## 2014-08-30 ENCOUNTER — Ambulatory Visit (INDEPENDENT_AMBULATORY_CARE_PROVIDER_SITE_OTHER): Payer: Medicare Other | Admitting: Nurse Practitioner

## 2014-08-30 ENCOUNTER — Encounter: Payer: Self-pay | Admitting: Nurse Practitioner

## 2014-08-30 VITALS — BP 134/80 | HR 64 | Ht 65.0 in | Wt 124.0 lb

## 2014-08-30 DIAGNOSIS — K6289 Other specified diseases of anus and rectum: Secondary | ICD-10-CM | POA: Diagnosis not present

## 2014-08-30 DIAGNOSIS — R933 Abnormal findings on diagnostic imaging of other parts of digestive tract: Secondary | ICD-10-CM | POA: Diagnosis not present

## 2014-08-30 NOTE — Progress Notes (Addendum)
  HPI :   Patient is a 81-year-old female referred by emergency department physician (Dr. Stephen Rancour) for evaluation of rectal pain and an abnormal CT scan. Patient is here with her granddaughter who describes sudden onset of rectal pain last week. Patient complained of pain when standing or sitting but no pain with defecation. No rectal bleeding. As the granddaughter knows patient had not been constipated or having any diarrhea. Patient has dementia, history is provided by the granddaughter.   Patient was taken to the emergency room in [place], sounds like she was diagnosed with constipation / fecal impaction.  For continued pain patient went to [place] 08/24/14 where CT scan of the abdomen and pelvis with contrast suggested proctitis versus rectal mass. CBC pertinent for hemoglobin of 10.5, down from 11.4 late November. No other major labwork abnormalities.   Patient has continued to complain of rectal pain, she is taking Cipro and Flagyl as prescribed by the emergency department  Past Medical History  Diagnosis Date  . Dementia     Family History  Problem Relation Age of Onset  . Colon cancer Son   . Colon polyps Neg Hx   . Heart attack Son   . Diabetes Neg Hx   . Kidney disease Neg Hx    History  Substance Use Topics  . Smoking status: Never Smoker   . Smokeless tobacco: Never Used  . Alcohol Use: No   Current Outpatient Prescriptions  Medication Sig Dispense Refill  . cholecalciferol (VITAMIN D) 1000 UNITS tablet Take 1,000 Units by mouth daily.    . Cholecalciferol (VITAMIN D3) 1000 UNITS CHEW Chew by mouth daily.     . ciprofloxacin (CIPRO) 250 MG tablet Take 1 tablet (250 mg total) by mouth 2 (two) times daily. 20 tablet 0  . clotrimazole (LOTRIMIN) 1 % cream Apply 1 application topically 2 (two) times daily.    . Memantine HCl ER (NAMENDA XR) 28 MG CP24 Take 28 mg by mouth daily.    . metroNIDAZOLE (FLAGYL) 500 MG tablet Take 1 tablet (500 mg total) by mouth 2  (two) times daily. 20 tablet 0  . sertraline (ZOLOFT) 25 MG tablet Take 25 mg by mouth daily.    . traMADol (ULTRAM) 50 MG tablet Take 1 tablet (50 mg total) by mouth every 6 (six) hours as needed. 15 tablet 0   No current facility-administered medications for this visit.   Allergies  Allergen Reactions  . Benzodiazepines      Review of Systems: Positive for anxiety, confusion, sleeping problems and swelling of the feet and legs. All other systems reviewed and negative except where noted in HPI.    Ct Abdomen Pelvis W Contrast  08/24/2014   CLINICAL DATA:  Rectal pain for 6 days, normal bowel movements, history dementia  EXAM: CT ABDOMEN AND PELVIS WITH CONTRAST  TECHNIQUE: Multidetector CT imaging of the abdomen and pelvis was performed using the standard protocol following bolus administration of intravenous contrast. Sagittal and coronal MPR images reconstructed from axial data set.  CONTRAST:  50mL OMNIPAQUE IOHEXOL 300 MG/ML SOLN PO, 100mL OMNIPAQUE IOHEXOL 300 MG/ML SOLN IV  COMPARISON:  None  FINDINGS: Minimal atelectasis or scarring at RIGHT lung base.  Pacemaker leads RIGHT atrium and RIGHT ventricle.  Calcified granulomata within liver and spleen.  Gallbladder surgically absent.  Liver, spleen, atrophic pancreas, kidneys, and adrenal glands otherwise normal.  Stomach and small bowel loops grossly unremarkable.  Rectal wall thickening which could represent proctitis or tumor.    Remainder of colon significant only for sigmoid diverticulosis.  No mass, adenopathy, free air, or free fluid.  Uterus nonvisualized with grossly normal sized ovaries.  Appendix not identified.  Bones demineralized.  IMPRESSION: Rectal wall thickening which could root inflated to proctitis or tumor; correlation are proctoscopy recommended.  Old granulomatous disease.  Sigmoid diverticulosis.  No other definite intra-abdominal or intrapelvic abnormalities.   Electronically Signed   By: Mark  Boles M.D.   On: 08/24/2014  16:30    Physical Exam: BP 134/80 mmHg  Pulse 64  Ht 5' 5" (1.651 m)  Wt 124 lb (56.246 kg)  BMI 20.63 kg/m2 Constitutional: Pleasant,well-developed, black female in no acute distress. HEENT: Normocephalic and atraumatic. Conjunctivae are normal. No scleral icterus. Neck supple.  Cardiovascular: Normal rate, regular rhythm.  Pulmonary/chest: Effort normal and breath sounds normal. No wheezing, rales or rhonchi. Abdominal: Soft, nondistended, nontender. Bowel sounds active throughout. There are no masses palpable. No hepatomegaly. Rectal: no external lesions or fissures seen. Large amount of soft, light brown, heme negative stool in vault Extremities: no edema Lymphadenopathy: No cervical adenopathy noted. Neurological: Alert , pleasantly confused at times.  Skin: Skin is warm and dry. No rashes noted. Psychiatric: Normal mood and affect. Behavior is normal.   ASSESSMENT AND PLAN:  1. Pleasant 81-year-old female with dementia referred by emergency department physician for rectal pain and proctitis on CT scan. Pain started acutely last week. No associated diarrhea. Granddaughter is not aware of any constipation problems, grandmother is having bowel movements. No rectal bleeding. White count normal, abdominal exam benign. There is a large amount of soft stool in the vault precluding adequate DRE. At this point I think bowels need to be evacuated to rule out constipation as source of pain. Granddaughter will administer enemas tonight and call us tomorrow with a condition update. Spoke with granddaughter about further workup (? Sigmoidoscopy), especially if pain does not improve with resolution of constipation. Patient will continue Cipro and Flagyl in the event that this is infectious though she does not have the typical symptoms associated with infectious proctitis  2. Dementia  CC: Stephen Rancour, MD  Addendum: Reviewed and agree with initial management.  Plan flex sigmoidoscopy on  09/12/14 to eval persistent rectal pain Jay M Pyrtle, MD     

## 2014-08-30 NOTE — Patient Instructions (Signed)
Get Saline Enemas at the pharmacy, or [facility].   Give her one tonight- 4-12. If no results ( stool)  give another one 2 hours later.  Call us in 1-2  Days later with an update on her rectal pain and bowl movements.  Ask for the nurse, Rene Kocheregina . If she is not available, ask for Lister Brizzi.

## 2014-08-31 ENCOUNTER — Encounter: Payer: Self-pay | Admitting: Nurse Practitioner

## 2014-09-02 ENCOUNTER — Telehealth: Payer: Self-pay | Admitting: Nurse Practitioner

## 2014-09-02 DIAGNOSIS — K6289 Other specified diseases of anus and rectum: Secondary | ICD-10-CM

## 2014-09-02 NOTE — Telephone Encounter (Signed)
Sherri Leonard, Will forward to Dr. [name] who will be her GI. He may prefer daughter to give a couple of enemas and come for flex or at least anoscopy rather than be hospitalized for prep.

## 2014-09-02 NOTE — Telephone Encounter (Signed)
Spoke with the granddaughter. The patient is having regular bowel movements. No constipation, but continues to have pain in the rectal area. The family does want to go forward with the flex sig. Granddaughter is uncertain if she can safely prep the patient at home due to her limited mobility and ambulation. She is interested in putting the patient in the hospital overnight for the prep. Please advise.

## 2014-09-06 ENCOUNTER — Encounter: Payer: Self-pay | Admitting: Nurse Practitioner

## 2014-09-06 ENCOUNTER — Ambulatory Visit (INDEPENDENT_AMBULATORY_CARE_PROVIDER_SITE_OTHER): Payer: Medicare Other | Admitting: Nurse Practitioner

## 2014-09-06 ENCOUNTER — Encounter: Payer: Self-pay | Admitting: *Deleted

## 2014-09-06 VITALS — BP 126/70 | HR 67 | Temp 97.9°F | Resp 16 | Ht 65.0 in | Wt 119.0 lb

## 2014-09-06 DIAGNOSIS — K6289 Other specified diseases of anus and rectum: Secondary | ICD-10-CM

## 2014-09-06 DIAGNOSIS — R269 Unspecified abnormalities of gait and mobility: Secondary | ICD-10-CM | POA: Diagnosis not present

## 2014-09-06 DIAGNOSIS — F0391 Unspecified dementia with behavioral disturbance: Secondary | ICD-10-CM

## 2014-09-06 DIAGNOSIS — F411 Generalized anxiety disorder: Secondary | ICD-10-CM

## 2014-09-06 DIAGNOSIS — R001 Bradycardia, unspecified: Secondary | ICD-10-CM

## 2014-09-06 DIAGNOSIS — F03918 Unspecified dementia, unspecified severity, with other behavioral disturbance: Secondary | ICD-10-CM | POA: Insufficient documentation

## 2014-09-06 MED ORDER — ZOSTER VACCINE LIVE 19400 UNT/0.65ML ~~LOC~~ SOLR: 0.6500 mL | Freq: Once | SUBCUTANEOUS | Status: DC

## 2014-09-06 MED ORDER — SERTRALINE HCL 25 MG PO TABS: 25.0000 mg | ORAL_TABLET | Freq: Every day | ORAL | Status: DC

## 2014-09-06 MED ORDER — MEMANTINE HCL ER 28 MG PO CP24: 28.0000 mg | ORAL_CAPSULE | Freq: Every day | ORAL | Status: DC

## 2014-09-06 NOTE — Patient Instructions (Signed)
STOP aleve  Start tylenol 325 mg tablet 1-2 tablet every 6 hours as needed for pain  Complete antibiotic- take with food to help prevent GI upset  Ensure/resourse or other nutritional supplement 1-2 times daily, may mix with peanut butter or yogurt

## 2014-09-06 NOTE — Progress Notes (Signed)
Patient ID: Sherri Leonard, female   DOB: 03/01/1921, 81 y.o.   MRN: [id number]    PCP: Sharon SellerEUBANKS, JESSICA K, NP  Allergies  Allergen Reactions  . Ativan [Lorazepam] Other (See Comments)    Causes violence   . Benzodiazepines     Chief Complaint  Patient presents with  . Establish Care    New patient establish care: weight loss, seen in ER x 2 in the last weeks for rectal pain. Seen [facility]. Fasting if any labs due  . Posture Concerns    Recommendation/referral for home health, patient has several needs- higher walker to prevent hunch back, toilet seat elevation, and overall assesment.      HPI: Patient is a 81 y.o. female seen in the office today to establish care. Has been seen several places but granddaughter reported she did not have routine care and therefore is transferring her here for management.  Seeing GI due to rectal pain. Mass vs inflammation found CT. No constipation. Since she has been taking cipro and flagyl and pain has improved. Decreased appetite when she was in pain.  Never had colonoscopy.  Questions medications Pt was on Aricept in the past- it was changed to namenda at some point- granddaughter unsure why. Did not have any problems with medication.  Granddaughter has been taking care of pt for 8 years. Mobility has been slowing down. Using 4 wheel walker and bending over walker- walker unable to be adjusted at this time Has had multiple falls in the last 6 months. Granddaughter really wanting some therapy to assess the home.    Advanced Directive information Does patient have an advance directive?: Yes Review of Systems:  Review of Systems  Constitutional: Positive for appetite change (still she has been having rectal pain) and unexpected weight change (weight loss). Negative for activity change.  Respiratory: Negative for shortness of breath.   Cardiovascular: Positive for leg swelling (mild). Negative for chest pain.  Gastrointestinal: Positive for  rectal pain. Negative for nausea, abdominal pain, diarrhea, constipation and abdominal distention.  Genitourinary: Negative for difficulty urinating.  Musculoskeletal: Negative.   Skin: Negative.   Neurological: Negative for dizziness.  Psychiatric/Behavioral: Positive for behavioral problems, confusion and agitation.    Past Medical History  Diagnosis Date  . Dementia   . Pacemaker   . OA (osteoarthritis)    Past Surgical History  Procedure Laterality Date  . Atrial cardiac pacemaker insertion    . Knee arthroplasty Bilateral   . Carpal tunnel release Bilateral   . Brain surgery  2013   Social History:   reports that she has never smoked. She has never used smokeless tobacco. She reports that she does not drink alcohol or use illicit drugs.  Family History  Problem Relation Age of Onset  . Heart disease Son   . Cancer Son 60    on Liver  . Colon polyps Neg Hx   . Diabetes Neg Hx   . Kidney disease Neg Hx   . Heart attack Son   . Alzheimer's disease Mother   . Alzheimer's disease Father     Medications: Patient's Medications  New Prescriptions   No medications on file  Previous Medications   CHOLECALCIFEROL (VITAMIN D) 1000 UNITS TABLET    Take 1,000 Units by mouth daily.   CIPROFLOXACIN (CIPRO) 250 MG TABLET    Take 1 tablet (250 mg total) by mouth 2 (two) times daily.   MEMANTINE HCL ER (NAMENDA XR) 28 MG CP24    Take  28 mg by mouth daily.   METRONIDAZOLE (FLAGYL) 500 MG TABLET    Take 1 tablet (500 mg total) by mouth 2 (two) times daily.   NAPROXEN SODIUM (ANAPROX) 220 MG TABLET    Take 220 mg by mouth as needed.   SERTRALINE (ZOLOFT) 25 MG TABLET    Take 25 mg by mouth daily.   TRAMADOL (ULTRAM) 50 MG TABLET    Take 1 tablet (50 mg total) by mouth every 6 (six) hours as needed.  Modified Medications   Modified Medication Previous Medication   ZOSTER VACCINE LIVE, PF, (ZOSTAVAX) 19147 UNT/0.65ML INJECTION zoster vaccine live, PF, (ZOSTAVAX) 82956 UNT/0.65ML  injection      Inject 19,400 Units into the skin once.    Inject 0.65 mLs into the skin once.  Discontinued Medications   CHOLECALCIFEROL (VITAMIN D3) 1000 UNITS CHEW    Chew by mouth daily.    CLOTRIMAZOLE (LOTRIMIN) 1 % CREAM    Apply 1 application topically 2 (two) times daily.     Physical Exam:  Filed Vitals:   09/06/14 0844  BP: 126/70  Pulse: 67  Temp: 97.9 F (36.6 C)  TempSrc: Oral  Resp: 16  Height:  (1.651 m)  Weight: 119 lb (53.978 kg)  SpO2: 91%    Physical Exam  Constitutional: No distress.  Frail elderly female  HENT:  Head: Normocephalic and atraumatic.  Mouth/Throat: Oropharynx is clear and moist. No oropharyngeal exudate.  Eyes: Conjunctivae and EOM are normal. Pupils are equal, round, and reactive to light.  Cardiovascular: Normal rate, regular rhythm and normal heart sounds.   Pulmonary/Chest: Effort normal and breath sounds normal.  Abdominal: Soft. Bowel sounds are normal. She exhibits no distension. There is no tenderness.  Musculoskeletal: She exhibits edema (trace). She exhibits no tenderness.  Neurological: She is alert.  Skin: Skin is warm and dry. She is not diaphoretic.  Psychiatric: She has a normal mood and affect.    Labs reviewed: Basic Metabolic Panel:  Recent Labs  21/30/86 1655 08/24/14 1440  NA 134* 140  K 4.4 3.7  CL 97 105  CO2 25 28  GLUCOSE 118* 89  BUN 30* 25*  CREATININE 1.00 0.77  CALCIUM 9.2 8.5   Liver Function Tests:  Recent Labs  08/24/14 1440  AST 17  ALT 9  ALKPHOS 83  BILITOT 0.5  PROT 6.4  ALBUMIN 3.3*    Recent Labs  08/24/14 1440  LIPASE 17   No results for input(s): AMMONIA in the last 8760 hours. CBC:  Recent Labs  04/17/14 1655 08/24/14 1440  WBC 5.8 4.8  NEUTROABS 3.7 2.5  HGB 11.4* 10.5*  HCT 35.6* 33.3*  MCV 96.0 96.5  PLT 199 154   Lipid Panel: No results for input(s): CHOL, HDL, LDLCALC, TRIG, CHOLHDL, LDLDIRECT in the last 8760 hours. TSH: No results for  input(s): TSH in the last 8760 hours. A1C: No results found for: HGBA1C   Assessment/Plan 1. Dementia with behavioral disturbance -worsening dementia noted by granddaughter, currently on namenda, previously on aricept but was stopped (unknown reason, granddaughter thinks they changed it to namenda) -starting pt on zoloft today due to anxiety but at next visit will add aricept titration  - memantine (NAMENDA XR) 28 MG CP24 24 hr capsule; Take 1 capsule (28 mg total) by mouth daily.  Dispense: 30 capsule; Refill: 3 -will get MMSE at next visit  2. Bradycardia -following with cardiology, s/p pacemaker in 2012  3. Anxiety state -worsened due to dementia, was  previously on zoloft but did not cont medication. Will send in refill to restart at this time.  - sertraline (ZOLOFT) 25 MG tablet; Take 1 tablet (25 mg total) by mouth daily.  Dispense: 30 tablet; Refill: 3  4. Gait disorder - with increase in falls over the last 6 months.  - Ambulatory referral to Home Health for evaluation of walker and assisted devices in the home, home safety assessment.   5. Rectal pain -improved with Cipro and flagyl   Follow up in 4 weeks for EV with MMSE

## 2014-09-07 NOTE — Telephone Encounter (Signed)
Would schedule flex sig on Monday, September 12, 2014 in the morning ([facility]  Enemas x 2 should be sufficient.  This can be done at home and again once here if necessary Please notify pt, granddaughter of plan.  [facility] says earlier today they have slots for Monday am.

## 2014-09-08 ENCOUNTER — Encounter (HOSPITAL_COMMUNITY): Payer: Self-pay | Admitting: *Deleted

## 2014-09-08 NOTE — Telephone Encounter (Signed)
I have scheduled patient for flexible sig[facility]oid[facility] on Monday, 09/12/14 @ 12:15 pm. I have advised patient's granddaughter, Amenia of prep instructions and she verbalizes understanding. I have also placed written instructions at the front desk for the granddaughter to pick up today. Amenia has been advised that she will need to be present at the hospital with patient for procedure since patient has dementia and she is power of attorney. She verbalizes understanding of this as well.

## 2014-09-09 NOTE — Progress Notes (Signed)
Faxed pacer maker device orders to dr Wynonia Hazardkhawaja on 09-08-14, no response received. Spoke with Jana Halfmarcia hayes [employer] rep and made aware of date and time of procedure 09-12-14 at [facility].

## 2014-09-12 ENCOUNTER — Encounter (HOSPITAL_COMMUNITY)
Admission: RE | Disposition: A | Discharge status: Home / Self Care | Payer: Self-pay | Source: Ambulatory Visit | Attending: Internal Medicine

## 2014-09-12 ENCOUNTER — Ambulatory Visit (HOSPITAL_COMMUNITY)
Admission: RE | Admit: 2014-09-12 | Discharge: 2014-09-12 | Disposition: A | Discharge status: Home / Self Care | Payer: Medicare Other | Source: Ambulatory Visit | Attending: Internal Medicine | Admitting: Internal Medicine

## 2014-09-12 ENCOUNTER — Ambulatory Visit (HOSPITAL_COMMUNITY): Payer: Medicare Other | Admitting: Certified Registered"

## 2014-09-12 ENCOUNTER — Encounter (HOSPITAL_COMMUNITY): Payer: Self-pay | Admitting: Gastroenterology

## 2014-09-12 DIAGNOSIS — Z79899 Other long term (current) drug therapy: Secondary | ICD-10-CM | POA: Diagnosis not present

## 2014-09-12 DIAGNOSIS — M1990 Unspecified osteoarthritis, unspecified site: Secondary | ICD-10-CM | POA: Diagnosis not present

## 2014-09-12 DIAGNOSIS — F0390 Unspecified dementia without behavioral disturbance: Secondary | ICD-10-CM | POA: Insufficient documentation

## 2014-09-12 DIAGNOSIS — I10 Essential (primary) hypertension: Secondary | ICD-10-CM | POA: Insufficient documentation

## 2014-09-12 DIAGNOSIS — K5790 Diverticulosis of intestine, part unspecified, without perforation or abscess without bleeding: Secondary | ICD-10-CM | POA: Insufficient documentation

## 2014-09-12 DIAGNOSIS — K6289 Other specified diseases of anus and rectum: Secondary | ICD-10-CM

## 2014-09-12 DIAGNOSIS — Z950 Presence of cardiac pacemaker: Secondary | ICD-10-CM | POA: Insufficient documentation

## 2014-09-12 DIAGNOSIS — R948 Abnormal results of function studies of other organs and systems: Secondary | ICD-10-CM | POA: Insufficient documentation

## 2014-09-12 DIAGNOSIS — R935 Abnormal findings on diagnostic imaging of other abdominal regions, including retroperitoneum: Secondary | ICD-10-CM | POA: Insufficient documentation

## 2014-09-12 HISTORY — PX: FLEXIBLE SIGMOIDOSCOPY: SHX5431

## 2014-09-12 SURGERY — SIGMOIDOSCOPY, FLEXIBLE
Anesthesia: Monitor Anesthesia Care

## 2014-09-12 MED ORDER — LACTATED RINGERS IV SOLN
INTRAVENOUS | Status: DC
  Administered 2014-09-12: 13:00:00 via INTRAVENOUS

## 2014-09-12 MED ORDER — PROPOFOL 10 MG/ML IV BOLUS
INTRAVENOUS | Status: AC
  Filled 2014-09-12: qty 20

## 2014-09-12 MED ORDER — PROPOFOL 10 MG/ML IV BOLUS
INTRAVENOUS | Status: DC | PRN
  Administered 2014-09-12: 50 mg via INTRAVENOUS

## 2014-09-12 NOTE — Op Note (Signed)
[facility] [address] [place], [id number]   FLEXIBLE SIGMOIDOSCOPY PROCEDURE REPORT  PATIENT: Sherri Leonard, Sherri Leonard  MR#: [id number] BIRTHDATE: [birth year] , 89  yrs. old GENDER: female ENDOSCOPIST: Beverley FiedlerJay M Luigi Stuckey, MD PROCEDURE DATE:  09/12/2014 PROCEDURE:   Sigmoidoscopy, diagnostic ASA CLASS:   Class III INDICATIONS:rectal pain, abnormal CT scan abd/pelvis with rectal thickening, question of proctitis. MEDICATIONS: Monitored anesthesia care and Per Anesthesia  DESCRIPTION OF PROCEDURE:   After the risks benefits and alternatives of the procedure were thoroughly explained, informed consent was obtained.  Digital exam revealed no abnormalities of the rectum. The [id number] ([id number]  endoscope was introduced through the anus  and advanced to the sigmoid colon , The exam was Without limitations.    The quality of the prep was The overall prep quality was adequate. .  The instrument was then slowly withdrawn as the mucosa was fully examined.       COLON FINDINGS: There was moderate diverticulosis noted in the sigmoid colon.   The colonic mucosa appeared normal in the sigmoid colon and rectum.    Retroflexion was not performed due to a narrow rectal vault.    The scope was then withdrawn from the patient and the procedure terminated.  COMPLICATIONS: There were no immediate complications.  ENDOSCOPIC IMPRESSION: 1.   Moderate diverticulosis was noted in the sigmoid colon 2.   The colonic mucosa appeared normal in the sigmoid colon and rectum. No evidence of proctitis or mass lesion 3.   No source for rectal pain, query intermittent rectal prolapse, healed anal fissure or resolved infection  RECOMMENDATIONS: 1.  Daily Benefiber 1-2 tablespoons daily to promote bowel regularity and avoid constipation 2.  Office follow-up if recurrent rectal pain or trouble with constipation  eSigned:  Beverley FiedlerJay M Julina Altmann, MD 09/12/2014 1:12 PM   CC: the patient

## 2014-09-12 NOTE — Interval H&P Note (Signed)
History and Physical Interval Note: Patient presents for flexible sigmoidoscopy to evaluate rectal pain and rectal thickening seen by CT scan Patient reports pain has improved.  Granddaughter reports the same the patient intermittently reports pain but not severe or constant as before The nature of the procedure, as well as the risks, benefits, and alternatives were carefully and thoroughly reviewed with the patient. Ample time for discussion and questions allowed. The patient understood, was satisfied, and agreed to proceed.     09/12/2014 12:46 PM  Sherri Leonard  has presented today for surgery, with the diagnosis of rectal pain  The various methods of treatment have been discussed with the patient and family. After consideration of risks, benefits and other options for treatment, the patient has consented to  Procedure(s): FLEXIBLE SIGMOIDOSCOPY (N/A) as a surgical intervention .  The patient's history has been reviewed, patient examined, no change in status, stable for surgery.  I have reviewed the patient's chart and labs.  Questions were answered to the patient's satisfaction.     [name] M

## 2014-09-12 NOTE — Anesthesia Postprocedure Evaluation (Signed)
  Anesthesia Post-op Note  Patient: Sherri Leonard  Procedure(s) Performed: Procedure(s): FLEXIBLE SIGMOIDOSCOPY (N/A)  Patient Location: PACU  Anesthesia Type:MAC  Level of Consciousness: awake  Airway and Oxygen Therapy: Patient Spontanous Breathing  Post-op Pain: none  Post-op Assessment: Post-op Vital signs reviewed, Patient's Cardiovascular Status Stable, Respiratory Function Stable, Patent Airway, No signs of Nausea or vomiting and Pain level controlled  Post-op Vital Signs: Reviewed and stable  Last Vitals:  Filed Vitals:   09/12/14 1320  BP: 122/57  Pulse: 63  Temp:   Resp: 14    Complications: No apparent anesthesia complications

## 2014-09-12 NOTE — Transfer of Care (Signed)
Immediate Anesthesia Transfer of Care Note  Patient: Sherri Leonard  Procedure(s) Performed: Procedure(s): FLEXIBLE SIGMOIDOSCOPY (N/A)  Patient Location: PACU  Anesthesia Type:MAC  Level of Consciousness:  sedated, patient cooperative and responds to stimulation  Airway & Oxygen Therapy:Patient Spontanous Breathing and Patient connected to face mask oxgen  Post-op Assessment:  Report given to PACU RN and Post -op Vital signs reviewed and stable  Post vital signs:  Reviewed and stable  Last Vitals:  Filed Vitals:   09/12/14 1213  BP: 147/68  Pulse: 98  Temp: 36.6 C  Resp: 11    Complications: No apparent anesthesia complications

## 2014-09-12 NOTE — H&P (View-Only) (Signed)
HPI :   Patient is a 81 year old female referred by emergency department physician (Dr. Glynn Octave) for evaluation of rectal pain and an abnormal CT scan. Patient is here with her granddaughter who describes sudden onset of rectal pain last week. Patient complained of pain when standing or sitting but no pain with defecation. No rectal bleeding. As the granddaughter knows patient had not been constipated or having any diarrhea. Patient has dementia, history is provided by the granddaughter.   Patient was taken to the emergency room in [place], sounds like she was diagnosed with constipation / fecal impaction.  For continued pain patient went to [facility] ED 08/24/14 where CT scan of the abdomen and pelvis with contrast suggested proctitis versus rectal mass. CBC pertinent for hemoglobin of 10.5, down from 11.4 late November. No other major labwork abnormalities.   Patient has continued to complain of rectal pain, she is taking Cipro and Flagyl as prescribed by the emergency department  Past Medical History  Diagnosis Date  . Dementia     Family History  Problem Relation Age of Onset  . Colon cancer Son   . Colon polyps Neg Hx   . Heart attack Son   . Diabetes Neg Hx   . Kidney disease Neg Hx    History  Substance Use Topics  . Smoking status: Never Smoker   . Smokeless tobacco: Never Used  . Alcohol Use: No   Current Outpatient Prescriptions  Medication Sig Dispense Refill  . cholecalciferol (VITAMIN D) 1000 UNITS tablet Take 1,000 Units by mouth daily.    . Cholecalciferol (VITAMIN D3) 1000 UNITS CHEW Chew by mouth daily.     . ciprofloxacin (CIPRO) 250 MG tablet Take 1 tablet (250 mg total) by mouth 2 (two) times daily. 20 tablet 0  . clotrimazole (LOTRIMIN) 1 % cream Apply 1 application topically 2 (two) times daily.    . Memantine HCl ER (NAMENDA XR) 28 MG CP24 Take 28 mg by mouth daily.    . metroNIDAZOLE (FLAGYL) 500 MG tablet Take 1 tablet (500 mg total) by mouth 2  (two) times daily. 20 tablet 0  . sertraline (ZOLOFT) 25 MG tablet Take 25 mg by mouth daily.    . traMADol (ULTRAM) 50 MG tablet Take 1 tablet (50 mg total) by mouth every 6 (six) hours as needed. 15 tablet 0   No current facility-administered medications for this visit.   Allergies  Allergen Reactions  . Benzodiazepines      Review of Systems: Positive for anxiety, confusion, sleeping problems and swelling of the feet and legs. All other systems reviewed and negative except where noted in HPI.    Ct Abdomen Pelvis W Contrast  08/24/2014   CLINICAL DATA:  Rectal pain for 6 days, normal bowel movements, history dementia  EXAM: CT ABDOMEN AND PELVIS WITH CONTRAST  TECHNIQUE: Multidetector CT imaging of the abdomen and pelvis was performed using the standard protocol following bolus administration of intravenous contrast. Sagittal and coronal MPR images reconstructed from axial data set.  CONTRAST:  50mL OMNIPAQUE IOHEXOL 300 MG/ML SOLN PO, OMNIPAQUE IOHEXOL 300 MG/ML SOLN IV  COMPARISON:  None  FINDINGS: Minimal atelectasis or scarring at RIGHT lung base.  Pacemaker leads RIGHT atrium and RIGHT ventricle.  Calcified granulomata within liver and spleen.  Gallbladder surgically absent.  Liver, spleen, atrophic pancreas, kidneys, and adrenal glands otherwise normal.  Stomach and small bowel loops grossly unremarkable.  Rectal wall thickening which could represent proctitis or tumor.  Remainder of colon significant only for sigmoid diverticulosis.  No mass, adenopathy, free air, or free fluid.  Uterus nonvisualized with grossly normal sized ovaries.  Appendix not identified.  Bones demineralized.  IMPRESSION: Rectal wall thickening which could root inflated to proctitis or tumor; correlation are proctoscopy recommended.  Old granulomatous disease.  Sigmoid diverticulosis.  No other definite intra-abdominal or intrapelvic abnormalities.   Electronically Signed   By: Ulyses SouthwardMark  Boles M.D.   On: 08/24/2014  16:30    Physical Exam: BP 134/80 mmHg  Pulse 64  Ht 5\' 5"  (1.651 m)  Wt 124 lb (56.246 kg)  BMI 20.63 kg/m2 Constitutional: Pleasant,well-developed, black female in no acute distress. HEENT: Normocephalic and atraumatic. Conjunctivae are normal. No scleral icterus. Neck supple.  Cardiovascular: Normal rate, regular rhythm.  Pulmonary/chest: Effort normal and breath sounds normal. No wheezing, rales or rhonchi. Abdominal: Soft, nondistended, nontender. Bowel sounds active throughout. There are no masses palpable. No hepatomegaly. Rectal: no external lesions or fissures seen. Large amount of soft, light brown, heme negative stool in vault Extremities: no edema Lymphadenopathy: No cervical adenopathy noted. Neurological: Alert , pleasantly confused at times.  Skin: Skin is warm and dry. No rashes noted. Psychiatric: Normal mood and affect. Behavior is normal.   ASSESSMENT AND PLAN:  1. Pleasant 81 year old female with dementia referred by emergency department physician for rectal pain and proctitis on CT scan. Pain started acutely last week. No associated diarrhea. Granddaughter is not aware of any constipation problems, grandmother is having bowel movements. No rectal bleeding. White count normal, abdominal exam benign. There is a large amount of soft stool in the vault precluding adequate DRE. At this point I think bowels need to be evacuated to rule out constipation as source of pain. Granddaughter will administer enemas tonight and call us tomorrow with a condition update. Spoke with granddaughter about further workup (? Sigmoidoscopy), especially if pain does not improve with resolution of constipation. Patient will continue Cipro and Flagyl in the event that this is infectious though she does not have the typical symptoms associated with infectious proctitis  2. Dementia  CC: Glynn OctaveStephen Rancour, MD  Addendum: Reviewed and agree with initial management.  Plan flex sigmoidoscopy on  09/12/14 to eval persistent rectal pain Beverley FiedlerJay M Pyrtle, MD

## 2014-09-12 NOTE — Anesthesia Preprocedure Evaluation (Signed)
Anesthesia Evaluation  Patient identified by MRN, date of birth, ID band Patient awake    Reviewed: Allergy & Precautions, NPO status , Patient's Chart, lab work & pertinent test results  History of Anesthesia Complications Negative for: history of anesthetic complications  Airway Mallampati: II  TM Distance: >3 FB Neck ROM: Full    Dental  (+) Teeth Intact   Pulmonary neg pulmonary ROS,  breath sounds clear to auscultation        Cardiovascular hypertension, + pacemaker Rhythm:Regular     Neuro/Psych dementianegative neurological ROS     GI/Hepatic negative GI ROS, Neg liver ROS,   Endo/Other  negative endocrine ROS  Renal/GU negative Renal ROS     Musculoskeletal  (+) Arthritis -,   Abdominal   Peds  Hematology negative hematology ROS (+)   Anesthesia Other Findings   Reproductive/Obstetrics                             Anesthesia Physical Anesthesia Plan  ASA: III  Anesthesia Plan: MAC   Post-op Pain Management:    Induction: Intravenous  Airway Management Planned: Natural Airway  Additional Equipment: None  Intra-op Plan:   Post-operative Plan:   Informed Consent: I have reviewed the patients History and Physical, chart, labs and discussed the procedure including the risks, benefits and alternatives for the proposed anesthesia with the patient or authorized representative who has indicated his/her understanding and acceptance.   Dental advisory given  Plan Discussed with: CRNA and Surgeon  Anesthesia Plan Comments:         Anesthesia Quick Evaluation

## 2014-09-12 NOTE — Discharge Instructions (Signed)

## 2014-09-13 ENCOUNTER — Encounter (HOSPITAL_COMMUNITY): Payer: Self-pay | Admitting: Internal Medicine

## 2014-09-29 ENCOUNTER — Ambulatory Visit (INDEPENDENT_AMBULATORY_CARE_PROVIDER_SITE_OTHER): Payer: Medicare Other | Admitting: Nurse Practitioner

## 2014-09-29 ENCOUNTER — Encounter: Payer: Self-pay | Admitting: Nurse Practitioner

## 2014-09-29 VITALS — BP 138/80 | HR 64 | Temp 98.2°F | Ht 62.5 in | Wt 120.0 lb

## 2014-09-29 DIAGNOSIS — Z0000 Encounter for general adult medical examination without abnormal findings: Secondary | ICD-10-CM | POA: Diagnosis not present

## 2014-09-29 DIAGNOSIS — K6289 Other specified diseases of anus and rectum: Secondary | ICD-10-CM | POA: Diagnosis not present

## 2014-09-29 DIAGNOSIS — F0391 Unspecified dementia with behavioral disturbance: Secondary | ICD-10-CM

## 2014-09-29 DIAGNOSIS — F411 Generalized anxiety disorder: Secondary | ICD-10-CM

## 2014-09-29 DIAGNOSIS — R269 Unspecified abnormalities of gait and mobility: Secondary | ICD-10-CM | POA: Diagnosis not present

## 2014-09-29 DIAGNOSIS — F03918 Unspecified dementia, unspecified severity, with other behavioral disturbance: Secondary | ICD-10-CM

## 2014-09-29 NOTE — Progress Notes (Signed)
Patient ID: Sherri Leonard, female   DOB: 08/15/1920, 81 y.o.   MRN: [id number]    PCP: Sharon SellerEUBANKS, JESSICA K, NP  Allergies  Allergen Reactions  . Ativan [Lorazepam] Other (See Comments)    Causes violence   . Benzodiazepines     Chief Complaint  Patient presents with  . Annual Exam    Comprehensive exam: dementia, bradycardia, anxiety. Here with grandaughter Armina  .   Had flexible sigmoidscope 09/12/14 by Dr. Erick BlinksJay Pyrtle  diverticulosis on sigmoid colon, no source for rectal pain     HPI: Patient is a 81 y.o. female seen in the office today for extended visit.  Pt lives with granddaughter who helps provide care.  Screenings: Colon Cancer- following with GI due to rectal pain, no colonoscopy Breast Cancer- no screening. Aged out.  Cervical Cancer- no screening in the past- aged out.  Osteoporosis- Dexa Scan- never had  Vaccines Unsure of vaccine history  Smoking status: none Alcohol use: none  Dentist: does not routinely go, poor hygiene  Ophthalmologist: hx of cataracts, no routine eye exam   Exercise regimen: none  Diet: liberalized diet due to poor intake   Functional Status of ADLs: Toileting- independent  Bathing- resistant to bathing, needs some assistant  Dressing-independent  Mobility- uses walker Feeding- independently  Having a hard time coordinating home health due to not being at home much, granddaughter pays for private help in [place].    Advanced Directive information Does patient have an advance directive?: Yes, Type of Advance Directive: Healthcare Power of Attorney Review of Systems:  Review of Systems  Constitutional: Negative for activity change, appetite change and fatigue.  Respiratory: Negative for shortness of breath.   Cardiovascular: Positive for leg swelling (mild). Negative for chest pain.  Gastrointestinal: Positive for rectal pain. Negative for nausea, abdominal pain, diarrhea, constipation and abdominal distention.    Genitourinary: Negative for difficulty urinating.  Musculoskeletal: Negative.   Skin: Negative.   Neurological: Negative for dizziness.  Psychiatric/Behavioral: Positive for confusion.       STML  Agitation and crying episodes have improved since on zoloft    Past Medical History  Diagnosis Date  . Dementia   . Pacemaker   . OA (osteoarthritis)    Past Surgical History  Procedure Laterality Date  . Knee arthroplasty Bilateral   . Carpal tunnel release Bilateral   . Brain surgery  2013  . Atrial cardiac pacemaker insertion  2012  . Flexible sigmoidoscopy N/A 09/12/2014    Procedure: FLEXIBLE SIGMOIDOSCOPY;  Surgeon: Beverley FiedlerJay M Pyrtle, MD;  Location: [facility] ENDOSCOPY;  Service: Gastroenterology;  Laterality: N/A;   Social History:   reports that she has never smoked. She has never used smokeless tobacco. She reports that she does not drink alcohol or use illicit drugs.  Family History  Problem Relation Age of Onset  . Heart disease Son   . Cancer Son 60    on Liver  . Colon polyps Neg Hx   . Diabetes Neg Hx   . Kidney disease Neg Hx   . Heart attack Son   . Alzheimer's disease Mother   . Alzheimer's disease Father     Medications: Patient's Medications  New Prescriptions   No medications on file  Previous Medications   CHOLECALCIFEROL (VITAMIN D) 1000 UNITS TABLET    Take 1,000 Units by mouth daily.   MEMANTINE (NAMENDA XR) 28 MG CP24 24 HR CAPSULE    Take 1 capsule (28 mg total) by mouth daily.   SERTRALINE (  ZOLOFT) 25 MG TABLET    Take 1 tablet (25 mg total) by mouth daily.   ZOSTER VACCINE LIVE, PF, (ZOSTAVAX) [id number] UNT/0.65ML INJECTION    Inject 19,400 Units into the skin once.  Modified Medications   No medications on file  Discontinued Medications   No medications on file     Physical Exam:  Filed Vitals:   09/29/14 0934  BP: 138/80  Pulse: 64  Temp: 98.2 F (36.8 C)  TempSrc: Oral  Height: 5' 2.5" (1.588 m)  Weight: 120 lb (54.432 kg)  SpO2: 93%     Physical Exam  Constitutional: No distress.  Frail elderly female  HENT:  Head: Normocephalic and atraumatic.  Right Ear: External ear normal.  Left Ear: External ear normal.  Nose: Nose normal.  Mouth/Throat: Oropharynx is clear and moist. No oropharyngeal exudate.  Eyes: Conjunctivae and EOM are normal. Pupils are equal, round, and reactive to light.  Neck: Normal range of motion. Neck supple.  Cardiovascular: Normal rate, regular rhythm and normal heart sounds.   Pulmonary/Chest: Effort normal and breath sounds normal.  Abdominal: Soft. Bowel sounds are normal. She exhibits no distension. There is no tenderness.  Musculoskeletal: She exhibits edema (trace). She exhibits no tenderness.  Neurological: She is alert. No cranial nerve deficit.  Skin: Skin is warm and dry. She is not diaphoretic.  Psychiatric: She has a normal mood and affect.    Labs reviewed: Basic Metabolic Panel:  Recent Labs  19/14/7811/29/15 1655 08/24/14 1440  NA 134* 140  K 4.4 3.7  CL 97 105  CO2 25 28  GLUCOSE 118* 89  BUN 30* 25*  CREATININE 1.00 0.77  CALCIUM 9.2 8.5   Liver Function Tests:  Recent Labs  08/24/14 1440  AST 17  ALT 9  ALKPHOS 83  BILITOT 0.5  PROT 6.4  ALBUMIN 3.3*    Recent Labs  08/24/14 1440  LIPASE 17   No results for input(s): AMMONIA in the last 8760 hours. CBC:  Recent Labs  04/17/14 1655 08/24/14 1440  WBC 5.8 4.8  NEUTROABS 3.7 2.5  HGB 11.4* 10.5*  HCT 35.6* 33.3*  MCV 96.0 96.5  PLT 199 154   Lipid Panel: No results for input(s): CHOL, HDL, LDLCALC, TRIG, CHOLHDL, LDLDIRECT in the last 8760 hours. TSH: No results for input(s): TSH in the last 8760 hours. A1C: No results found for: HGBA1C   Assessment/Plan  1. Gait disorder Has not been able to have home health come out to the home due to daughter working out of town and taking pt to private caregiver. May need to coordinate outpatient therapy.    2. Dementia with behavioral  disturbance -severe dementia noted with MMSE of 10/30. Pt currently taking namenda, may benefit from aricept but doing well on current regimen so will wait on this for now  3. Anxiety state -improved with zoloft.   4. Rectal pain Improved.   5. Preventative health care Discussed routine care-- pt being resistance to bathing, discussed aide helping with this more. Making sure to have good peri-care to avoid skin breakdown. Dental care to preserve teeth Routine eye MD checks  Keeping up with immunizations, will get those records.  To cont Keeping active and involved.  Follow up in 3 months, sooner if needed

## 2014-09-29 NOTE — Patient Instructions (Signed)
To have caregiver routinely give her a  bath her 3 days a week- she can still bathe herself on other days  Peri-care twice daily  Podiatrist- [facility]  Address: [address], [place], [place] [address] [phone]  To read 36 hour day   Follow up in 3 months with Dr Renato Gailseed

## 2014-09-29 NOTE — Progress Notes (Signed)
Failed clock drawing  

## 2014-10-03 ENCOUNTER — Telehealth: Payer: Self-pay | Admitting: *Deleted

## 2014-10-03 NOTE — Telephone Encounter (Signed)
Granddaughter called regarding where to go for the home health care agency, Shanda BumpsJessica had told her that it maybe possible for her to take her grandmother to a outpatient therapy facility. She was interested in getting in touch with the home care service. I called Judie PetitLisa Hart, who would in turn give the granddaughter a call to set something up.

## 2014-10-05 DIAGNOSIS — F419 Anxiety disorder, unspecified: Secondary | ICD-10-CM | POA: Diagnosis not present

## 2014-10-05 DIAGNOSIS — M6281 Muscle weakness (generalized): Secondary | ICD-10-CM | POA: Diagnosis not present

## 2014-10-05 DIAGNOSIS — M1990 Unspecified osteoarthritis, unspecified site: Secondary | ICD-10-CM | POA: Diagnosis not present

## 2014-10-05 DIAGNOSIS — F0391 Unspecified dementia with behavioral disturbance: Secondary | ICD-10-CM | POA: Diagnosis not present

## 2014-10-10 NOTE — Addendum Note (Signed)
Addended by: Sharon SellerEUBANKS, Stephaun Million K on: 10/10/2014 01:49 PM   Modules accepted: Level of Service

## 2014-10-19 ENCOUNTER — Ambulatory Visit (INDEPENDENT_AMBULATORY_CARE_PROVIDER_SITE_OTHER): Payer: Medicare Other | Admitting: Podiatry

## 2014-10-19 ENCOUNTER — Encounter: Payer: Self-pay | Admitting: Podiatry

## 2014-10-19 VITALS — BP 149/106 | HR 84 | Resp 18

## 2014-10-19 DIAGNOSIS — B351 Tinea unguium: Secondary | ICD-10-CM | POA: Diagnosis not present

## 2014-10-19 DIAGNOSIS — M79676 Pain in unspecified toe(s): Secondary | ICD-10-CM

## 2014-10-19 NOTE — Patient Instructions (Signed)
Over the counter treatment for toenail fungus is called fungi-nail  

## 2014-10-19 NOTE — Progress Notes (Signed)
   Subjective:    Patient ID: Sherri Leonard, female    DOB: 03/22/1921, 81 y.o.   MRN: [id number]  HPI  81 year old female presents to the office with her granddaughter for concerns for thick discolored toenails. The granddaughter states that all the toenails fell off several months ago after undergoing treatment for athletes foot. Denies any redness or drainage around the nail sites. States that the nails are painful as they are elongated. She complains of pain with certain shoegear. No other complaints at this time.   Review of Systems  All other systems reviewed and are negative.      Objective:   Physical Exam AAO x3, NAD DP/PT pulses palpable bilaterally 1/4, CRT less than 3 seconds Protective sensation decreased with Simms Weinstein monofilament, vibratory sensation decreased , Achilles tendon reflex intact Nails are hypertrophic, dystrophic, elongated, brittle, discolored 10. There is no surrounding erythema or drainage on the nail sites. There is tenderness to palpation overlying nails 1-5 bilaterally. No areas of tenderness to bilateral lower extremities. MMT 4/5, ROM WNL.  No open lesions or pre-ulcerative lesions. No evidence of tinea pedis at this time. No interdigital maceration.  No overlying edema, erythema, increase in warmth to bilateral lower extremities.  No pain with calf compression, swelling, warmth, erythema bilaterally.      Assessment & Plan:  81 year old female with syndromic onychomycosis -Treatment options discussed including all alternatives, risks, and complications -Nails debrided 10 without complication/bleeding -Discussed importance of daily foot inspection. -Follow-up in 3 months or sooner if any problems are to arise. In the meantime, call the office with any questions, concerns, change in symptoms.

## 2014-11-16 ENCOUNTER — Telehealth: Payer: Self-pay | Admitting: Nurse Practitioner

## 2014-11-16 ENCOUNTER — Ambulatory Visit (INDEPENDENT_AMBULATORY_CARE_PROVIDER_SITE_OTHER): Payer: Medicare Other

## 2014-11-16 DIAGNOSIS — Z111 Encounter for screening for respiratory tuberculosis: Secondary | ICD-10-CM | POA: Diagnosis not present

## 2014-11-16 DIAGNOSIS — Z029 Encounter for administrative examinations, unspecified: Secondary | ICD-10-CM

## 2014-11-16 NOTE — Telephone Encounter (Signed)
Ms. Sherri Leonard dropped off a medical history form to be filled out by [name]. The form was placed in the rx tray.

## 2014-11-17 ENCOUNTER — Telehealth: Payer: Self-pay | Admitting: *Deleted

## 2014-11-17 NOTE — Telephone Encounter (Signed)
Granddaughter, Sherri Leonard dropped off #[phone]--trying to admit to Assisted Living Received Forms from [facility] for Medical History. Filled out portions i could and given to Sherri Leonard to review and fill out rest. FL2 from already completed and TB placed.  Placed in Jessica's folder to review and sign

## 2014-11-17 NOTE — Telephone Encounter (Signed)
Paperwork completed. Tried calling the Granddaughter and the phone picks up but then hangs up. Tried 3 times.

## 2014-11-17 NOTE — Telephone Encounter (Signed)
Sherri Leonard, daughter called and left message and stated that there was a couple of things that needs to be corrected on the FL2 form. Stated that she will bringing patient in on Friday to have TB read. Called and left message with her [facility] and we will correct it.

## 2014-11-18 DIAGNOSIS — Z029 Encounter for administrative examinations, unspecified: Secondary | ICD-10-CM

## 2014-11-25 LAB — TB SKIN TEST
Induration: 0 mm
TB Skin Test: NEGATIVE

## 2014-11-28 ENCOUNTER — Telehealth: Payer: Self-pay | Admitting: *Deleted

## 2014-11-28 NOTE — Telephone Encounter (Signed)
Grand-daughter dropped off FL2 form to have another change made to it from the facility that she is planning to be admitted today. I informed her that it would be ready at 9:15 - 9:30 this morning for pick-up.

## 2014-12-08 ENCOUNTER — Encounter: Payer: Self-pay | Admitting: Nurse Practitioner

## 2014-12-30 ENCOUNTER — Ambulatory Visit: Payer: Medicare Other | Admitting: Internal Medicine

## 2015-01-06 ENCOUNTER — Encounter: Payer: Self-pay | Admitting: Nurse Practitioner

## 2015-01-06 ENCOUNTER — Ambulatory Visit: Payer: Medicare Other | Admitting: Internal Medicine

## 2015-01-26 ENCOUNTER — Ambulatory Visit: Payer: Medicare Other | Admitting: Podiatry

## 2015-03-27 ENCOUNTER — Encounter: Payer: Self-pay | Admitting: Internal Medicine

## 2015-03-27 ENCOUNTER — Ambulatory Visit (INDEPENDENT_AMBULATORY_CARE_PROVIDER_SITE_OTHER): Payer: Medicare Other | Admitting: Internal Medicine

## 2015-03-27 VITALS — BP 120/78 | HR 90 | Temp 97.8°F | Resp 20 | Ht 63.0 in | Wt 120.8 lb

## 2015-03-27 DIAGNOSIS — F329 Major depressive disorder, single episode, unspecified: Secondary | ICD-10-CM | POA: Diagnosis not present

## 2015-03-27 DIAGNOSIS — R269 Unspecified abnormalities of gait and mobility: Secondary | ICD-10-CM | POA: Diagnosis not present

## 2015-03-27 DIAGNOSIS — G4700 Insomnia, unspecified: Secondary | ICD-10-CM

## 2015-03-27 DIAGNOSIS — S90422A Blister (nonthermal), left great toe, initial encounter: Secondary | ICD-10-CM | POA: Diagnosis not present

## 2015-03-27 DIAGNOSIS — F03918 Unspecified dementia, unspecified severity, with other behavioral disturbance: Secondary | ICD-10-CM

## 2015-03-27 DIAGNOSIS — F0391 Unspecified dementia with behavioral disturbance: Secondary | ICD-10-CM

## 2015-03-27 DIAGNOSIS — F32A Depression, unspecified: Secondary | ICD-10-CM

## 2015-03-27 MED ORDER — MIRTAZAPINE 7.5 MG PO TABS: 7.5000 mg | ORAL_TABLET | Freq: Every day | ORAL | Status: DC

## 2015-03-27 NOTE — Progress Notes (Signed)
Patient ID: Sherri Leonard, female   DOB: May 12, 1921, 81 y.o.   MRN: [id number]   Location: [facility] Provider: Gwenith Spitz. Renato Gails, D.O., C.M.D.   Chief Complaint  Patient presents with  . Medical Management of Chronic Issues    3 month follow-up for dementia  . Immunizations    Discuss flu vaccine    HPI: Patient is a 81 y.o. female seen in the office today for medical mgt of chronic diseases.    She had [facility] living [facility].  They added medications and her granddaughter now has concerns about her.  She has taken her out of the facility and is looking after her at home with help of other family and a hired caregiver.  Rectal pain finally resolved.  GI did flex sig and she'd had prolapse.  Had never had any scope at all.  She had diverticulosis (moderate) in the sigmoid colon.    Pacemaker:  No problems lately.  Had pacer check at AL, but her granddaughter doesn't know if that happened.  [facility] cardiology.    Foot pain.  Had been to [facility].  Has darkness of her toe.  It has been painful.  This is going on at least since AL.  Needs podiatry f/u.  No one in the house is sleeping.  Her granddaughter has torn her achilles tendon.  Her great granddaughter has to get up to go to school.  She was wandering through the house and was sitting down in the living room.  There's an alarm so it would go off if she went out the door.  Up talking to people who aren't there.  Sometimes she is calling her granddaughter's name.  Two nights ago, she thought she was in a car and cried all night that she was alone in the car without shoes.    Had been started on hydroxyzine and melatonin for sleep.  She as also started on carbamezepine.    She is not violent or agitated.    Does c/o pain in the feet sometimes.    Says appetite is good.  Her granddaughter says so so--she likes only certain foods.  Says her mood is ok.  Has never been super outgoing, bubbly or  excited.   Fell at [employer] but has not fallen since.  Had hit her head and had hematoma.  Had home health PT after that.    Caregiver now in the day.  Niece also helps her.    Review of Systems:  Review of Systems  Constitutional: Positive for weight loss.  HENT: Negative for congestion.   Respiratory: Negative for shortness of breath.   Cardiovascular: Negative for chest pain.  Gastrointestinal: Negative for abdominal pain.  Genitourinary: Positive for urgency and frequency. Negative for dysuria.  Skin:       Darkness of skin on toes  Neurological: Negative for dizziness and loss of consciousness.  Psychiatric/Behavioral: Positive for memory loss. The patient has insomnia.        Talking at night, wandering through the house    Past Medical History  Diagnosis Date  . Dementia   . Pacemaker   . OA (osteoarthritis)     Past Surgical History  Procedure Laterality Date  . Knee arthroplasty Bilateral   . Carpal tunnel release Bilateral   . Brain surgery  2013  . Atrial cardiac pacemaker insertion  2012  . Flexible sigmoidoscopy N/A 09/12/2014    Procedure: FLEXIBLE SIGMOIDOSCOPY;  Surgeon: Beverley FiedlerJay M Pyrtle, MD;  [facility];  Service: Gastroenterology;  Laterality: N/A;    Allergies  Allergen Reactions  . Ativan [Lorazepam] Other (See Comments)    Causes violence   . Benzodiazepines       Medication List       This list is accurate as of: 03/27/15  4:11 PM.  Always use your most recent med list.               cholecalciferol 1000 UNITS tablet  Commonly known as:  VITAMIN D  Take 1,000 Units by mouth daily.     memantine 28 MG Cp24 24 hr capsule  Commonly known as:  NAMENDA XR  Take 1 capsule (28 mg total) by mouth daily.     sertraline 25 MG tablet  Commonly known as:  ZOLOFT  Take 1 tablet (25 mg total) by mouth daily.        Health Maintenance  Topic Date Due  . TETANUS/TDAP  04/24/1942  . ZOSTAVAX  04/25/1983  . DEXA SCAN   04/24/1988  . PNA vac Low Risk Adult (1 of 2 - PCV13) 04/24/1988  . INFLUENZA VACCINE  12/19/2014    Physical Exam: Filed Vitals:   03/27/15 1558  BP: 120/78  Pulse: 90  Temp: 97.8 F (36.6 C)  TempSrc: Oral  Resp: 20  Height: 5\' 3"  (1.6 m)  Weight: 120 lb 12.8 oz (54.795 kg)  SpO2: 94%   Body mass index is 21.4 kg/(m^2). Physical Exam  Constitutional: No distress.  Thin black female  Cardiovascular: Normal rate, regular rhythm, normal heart sounds and intact distal pulses.   Pulmonary/Chest: Effort normal and breath sounds normal.  Abdominal: Soft. Bowel sounds are normal.  Musculoskeletal: Normal range of motion.  Neurological: She is alert.  Skin: Skin is warm and dry.  Slight darkening of skin on toes of feet; great toe of left foot appears to have a ruptured blood blister on the tip  Psychiatric:  Was very pleasant and cooperative    Labs reviewed: Basic Metabolic Panel:  Recent Labs  62/13/0811/29/15 1655 08/24/14 1440  NA 134* 140  K 4.4 3.7  CL 97 105  CO2 25 28  GLUCOSE 118* 89  BUN 30* 25*  CREATININE 1.00 0.77  CALCIUM 9.2 8.5   Liver Function Tests:  Recent Labs  08/24/14 1440  AST 17  ALT 9  ALKPHOS 83  BILITOT 0.5  PROT 6.4  ALBUMIN 3.3*    Recent Labs  08/24/14 1440  LIPASE 17   No results for input(s): AMMONIA in the last 8760 hours. CBC:  Recent Labs  04/17/14 1655 08/24/14 1440  WBC 5.8 4.8  NEUTROABS 3.7 2.5  HGB 11.4* 10.5*  HCT 35.6* 33.3*  MCV 96.0 96.5  PLT 199 154   Assessment/Plan 1. Dementia with behavioral disturbance -she is doing well since home except for getting up at night and wandering, talking to people, appetite not great either and she lost weight while at the assisted living - CBC with Differential/Platelet - Comprehensive metabolic panel - TSH  2. Insomnia - due to her dementia, but also has some weight loss and seems to have a bit of depression so will treat with remeron to see if all three  problems can be addressed with minimal side effects - mirtazapine (REMERON) 7.5 MG tablet; Take 1 tablet (7.5 mg total) by mouth at bedtime.  Dispense: 30 tablet; Refill: 3  3. Blister of great toe, left, initial encounter -advised  to f/u with podiatry for this and cushion the toe in the shoe in the meantime to prevent further skin breakdown  4. Gait disorder -is not steady walking and should use a walker at all times, but is not compliant and likely cannot remember to do so -consider PT at home  5. Depression - see above, treat with remeron - mirtazapine (REMERON) 7.5 MG tablet; Take 1 tablet (7.5 mg total) by mouth at bedtime.  Dispense: 30 tablet; Refill: 3 - CBC with Differential/Platelet - Comprehensive metabolic panel - TSH  Labs/tests ordered:   Orders Placed This Encounter  Procedures  . CBC with Differential/Platelet  . Comprehensive metabolic panel  . TSH   Next appt:  F/u with Shanda Bumps   Valary Manahan L. Janesha Brissette, D.O. Geriatrics [facility] [address], [place] [address] Cell Phone (Mon-Fri 8am-5pm):  [phone] On Call:  [phone] & follow prompts after 5pm & weekends Office Phone:  [phone] Office Fax:  [phone]

## 2015-03-27 NOTE — Patient Instructions (Signed)
Please make an appt with [facility] about her left great toe blister.  Try mirtazapine 7.5mg  at bedtime for depression, difficulty with sleep and appetite.

## 2015-03-28 ENCOUNTER — Telehealth: Payer: Self-pay

## 2015-03-28 LAB — CBC WITH DIFFERENTIAL/PLATELET
Basophils Absolute: 0 10*3/uL (ref 0.0–0.2)
Basos: 1 %
EOS (ABSOLUTE): 0.2 10*3/uL (ref 0.0–0.4)
Eos: 3 %
Hematocrit: 33 % — ABNORMAL LOW (ref 34.0–46.6)
Hemoglobin: 10.8 g/dL — ABNORMAL LOW (ref 11.1–15.9)
Immature Grans (Abs): 0 10*3/uL (ref 0.0–0.1)
Immature Granulocytes: 0 %
Lymphocytes Absolute: 1.2 10*3/uL (ref 0.7–3.1)
Lymphs: 28 %
MCH: 30.4 pg (ref 26.6–33.0)
MCHC: 32.7 g/dL (ref 31.5–35.7)
MCV: 93 fL (ref 79–97)
Monocytes Absolute: 0.4 10*3/uL (ref 0.1–0.9)
Monocytes: 9 %
Neutrophils Absolute: 2.6 10*3/uL (ref 1.4–7.0)
Neutrophils: 59 %
Platelets: 198 10*3/uL (ref 150–379)
RBC: 3.55 x10E6/uL — ABNORMAL LOW (ref 3.77–5.28)
RDW: 15.1 % (ref 12.3–15.4)
WBC: 4.4 10*3/uL (ref 3.4–10.8)

## 2015-03-28 LAB — COMPREHENSIVE METABOLIC PANEL
ALT: 15 IU/L (ref 0–32)
AST: 22 IU/L (ref 0–40)
Albumin/Globulin Ratio: 1.2 (ref 1.1–2.5)
Albumin: 3.9 g/dL (ref 3.2–4.6)
Alkaline Phosphatase: 90 IU/L (ref 39–117)
BUN/Creatinine Ratio: 37 — ABNORMAL HIGH (ref 11–26)
BUN: 25 mg/dL (ref 10–36)
Bilirubin Total: <0.2 mg/dL (ref 0.0–1.2)
CO2: 25 mmol/L (ref 18–29)
Calcium: 9.1 mg/dL (ref 8.7–10.3)
Chloride: 101 mmol/L (ref 97–106)
Creatinine, Ser: 0.67 mg/dL (ref 0.57–1.00)
GFR calc Af Amer: 89 mL/min/{1.73_m2} (ref >59)
GFR calc non Af Amer: 77 mL/min/{1.73_m2} (ref >59)
Globulin, Total: 3.2 g/dL (ref 1.5–4.5)
Glucose: 105 mg/dL — ABNORMAL HIGH (ref 65–99)
Potassium: 4.3 mmol/L (ref 3.5–5.2)
Sodium: 142 mmol/L (ref 136–144)
Total Protein: 7.1 g/dL (ref 6.0–8.5)

## 2015-03-28 LAB — TSH: TSH: 1.74 u[IU]/mL (ref 0.450–4.500)

## 2015-03-28 NOTE — Telephone Encounter (Signed)
Patients granddaughter called concerned about her grandmother's medication said that there were a medication she is no sure about and wonders if she should give the carbamazepine to her mother Please advise

## 2015-03-29 NOTE — Telephone Encounter (Signed)
It had not been added to list because she didn't have the medication with her when she came in for her visit, granddaughter had a list of medication she was given at nursing home and she wanted to know if she should keep giving all the medication to her grandmother and if so to make sure they mixed well together

## 2015-03-29 NOTE — Telephone Encounter (Signed)
Called  and spoke with patients granddaughter told her what the doctor said she had no questions.

## 2015-03-29 NOTE — Telephone Encounter (Signed)
I'm not sure what the reason was for the tegretol.  I do not recommend she take it at this time.  She should stop all of those three pills from the assisted living.  Only do what we talked about at the appt.

## 2015-03-29 NOTE — Telephone Encounter (Signed)
I do not see this on her medication list however Dr Renato Gailseed saw her on 11/7 and may know more about medication

## 2015-03-30 ENCOUNTER — Other Ambulatory Visit: Payer: Self-pay | Admitting: *Deleted

## 2015-03-30 MED ORDER — VITAMIN D3 25 MCG (1000 UNIT) PO TABS: 1000.0000 [IU] | ORAL_TABLET | Freq: Every day | ORAL | Status: DC

## 2015-03-30 NOTE — Telephone Encounter (Signed)
Sherri Leonard, daughter called and wanted vitamin D called into pharmacy.

## 2015-04-06 ENCOUNTER — Emergency Department (HOSPITAL_BASED_OUTPATIENT_CLINIC_OR_DEPARTMENT_OTHER): Payer: Medicare Other

## 2015-04-06 ENCOUNTER — Inpatient Hospital Stay (HOSPITAL_BASED_OUTPATIENT_CLINIC_OR_DEPARTMENT_OTHER)
Admission: EM | Admit: 2015-04-06 | Discharge: 2015-04-09 | DRG: 071 | Disposition: A | Discharge status: Skilled Nursing Facility | Payer: Medicare Other | Attending: Internal Medicine | Admitting: Internal Medicine

## 2015-04-06 ENCOUNTER — Telehealth: Payer: Self-pay | Admitting: *Deleted

## 2015-04-06 ENCOUNTER — Ambulatory Visit (INDEPENDENT_AMBULATORY_CARE_PROVIDER_SITE_OTHER): Payer: Medicare Other | Admitting: Podiatry

## 2015-04-06 ENCOUNTER — Encounter: Payer: Self-pay | Admitting: Podiatry

## 2015-04-06 ENCOUNTER — Encounter (HOSPITAL_BASED_OUTPATIENT_CLINIC_OR_DEPARTMENT_OTHER): Payer: Self-pay

## 2015-04-06 VITALS — BP 117/76 | HR 76 | Resp 16

## 2015-04-06 DIAGNOSIS — E860 Dehydration: Secondary | ICD-10-CM | POA: Diagnosis present

## 2015-04-06 DIAGNOSIS — G309 Alzheimer's disease, unspecified: Secondary | ICD-10-CM | POA: Diagnosis present

## 2015-04-06 DIAGNOSIS — Z8249 Family history of ischemic heart disease and other diseases of the circulatory system: Secondary | ICD-10-CM

## 2015-04-06 DIAGNOSIS — Z800 Family history of malignant neoplasm of digestive organs: Secondary | ICD-10-CM

## 2015-04-06 DIAGNOSIS — Z820 Family history of epilepsy and other diseases of the nervous system: Secondary | ICD-10-CM | POA: Diagnosis not present

## 2015-04-06 DIAGNOSIS — G9340 Encephalopathy, unspecified: Secondary | ICD-10-CM | POA: Diagnosis not present

## 2015-04-06 DIAGNOSIS — R739 Hyperglycemia, unspecified: Secondary | ICD-10-CM | POA: Diagnosis present

## 2015-04-06 DIAGNOSIS — D649 Anemia, unspecified: Secondary | ICD-10-CM | POA: Diagnosis present

## 2015-04-06 DIAGNOSIS — Z7982 Long term (current) use of aspirin: Secondary | ICD-10-CM

## 2015-04-06 DIAGNOSIS — E872 Acidosis, unspecified: Secondary | ICD-10-CM | POA: Diagnosis present

## 2015-04-06 DIAGNOSIS — F0391 Unspecified dementia with behavioral disturbance: Secondary | ICD-10-CM | POA: Diagnosis not present

## 2015-04-06 DIAGNOSIS — R4182 Altered mental status, unspecified: Secondary | ICD-10-CM | POA: Diagnosis present

## 2015-04-06 DIAGNOSIS — Z950 Presence of cardiac pacemaker: Secondary | ICD-10-CM

## 2015-04-06 DIAGNOSIS — N390 Urinary tract infection, site not specified: Secondary | ICD-10-CM | POA: Diagnosis present

## 2015-04-06 DIAGNOSIS — B9789 Other viral agents as the cause of diseases classified elsewhere: Secondary | ICD-10-CM | POA: Diagnosis present

## 2015-04-06 DIAGNOSIS — R400 Somnolence: Secondary | ICD-10-CM | POA: Diagnosis not present

## 2015-04-06 DIAGNOSIS — I959 Hypotension, unspecified: Secondary | ICD-10-CM | POA: Diagnosis present

## 2015-04-06 DIAGNOSIS — R410 Disorientation, unspecified: Secondary | ICD-10-CM | POA: Diagnosis not present

## 2015-04-06 DIAGNOSIS — R404 Transient alteration of awareness: Secondary | ICD-10-CM

## 2015-04-06 DIAGNOSIS — B351 Tinea unguium: Secondary | ICD-10-CM

## 2015-04-06 DIAGNOSIS — F03918 Unspecified dementia, unspecified severity, with other behavioral disturbance: Secondary | ICD-10-CM | POA: Diagnosis present

## 2015-04-06 LAB — I-STAT CG4 LACTIC ACID, ED: Lactic Acid, Venous: 3.2 mmol/L (ref 0.5–2.0)

## 2015-04-06 LAB — URINALYSIS, ROUTINE W REFLEX MICROSCOPIC
Glucose, UA: NEGATIVE mg/dL
Hgb urine dipstick: NEGATIVE
Ketones, ur: NEGATIVE mg/dL
LEUKOCYTES UA: NEGATIVE
NITRITE: NEGATIVE
PROTEIN: 30 mg/dL — AB
SPECIFIC GRAVITY, URINE: 1.025 (ref 1.005–1.030)
pH: 5.5 (ref 5.0–8.0)

## 2015-04-06 LAB — CBC WITH DIFFERENTIAL/PLATELET
BASOS ABS: 0 10*3/uL (ref 0.0–0.1)
Basophils Relative: 0 %
EOS PCT: 3 %
Eosinophils Absolute: 0.2 10*3/uL (ref 0.0–0.7)
HCT: 35.9 % — ABNORMAL LOW (ref 36.0–46.0)
Hemoglobin: 11.4 g/dL — ABNORMAL LOW (ref 12.0–15.0)
LYMPHS PCT: 35 %
Lymphs Abs: 1.9 10*3/uL (ref 0.7–4.0)
MCH: 30.6 pg (ref 26.0–34.0)
MCHC: 31.8 g/dL (ref 30.0–36.0)
MCV: 96.2 fL (ref 78.0–100.0)
MONO ABS: 0.5 10*3/uL (ref 0.1–1.0)
Monocytes Relative: 9 %
Neutro Abs: 2.8 10*3/uL (ref 1.7–7.7)
Neutrophils Relative %: 53 %
PLATELETS: 151 10*3/uL (ref 150–400)
RBC: 3.73 MIL/uL — ABNORMAL LOW (ref 3.87–5.11)
RDW: 14.9 % (ref 11.5–15.5)
WBC: 5.4 10*3/uL (ref 4.0–10.5)

## 2015-04-06 LAB — TROPONIN I

## 2015-04-06 LAB — COMPREHENSIVE METABOLIC PANEL
ALT: 17 U/L (ref 14–54)
ANION GAP: 6 (ref 5–15)
AST: 24 U/L (ref 15–41)
Albumin: 3.5 g/dL (ref 3.5–5.0)
Alkaline Phosphatase: 74 U/L (ref 38–126)
BUN: 32 mg/dL — AB (ref 6–20)
CHLORIDE: 105 mmol/L (ref 101–111)
CO2: 29 mmol/L (ref 22–32)
Calcium: 9 mg/dL (ref 8.9–10.3)
Creatinine, Ser: 1.02 mg/dL — ABNORMAL HIGH (ref 0.44–1.00)
GFR, EST AFRICAN AMERICAN: 54 mL/min — AB (ref >60)
GFR, EST NON AFRICAN AMERICAN: 47 mL/min — AB (ref >60)
Glucose, Bld: 218 mg/dL — ABNORMAL HIGH (ref 65–99)
POTASSIUM: 4.3 mmol/L (ref 3.5–5.1)
Sodium: 140 mmol/L (ref 135–145)
Total Bilirubin: 0.5 mg/dL (ref 0.3–1.2)
Total Protein: 7.1 g/dL (ref 6.5–8.1)

## 2015-04-06 LAB — CBG MONITORING, ED: GLUCOSE-CAPILLARY: 214 mg/dL — AB (ref 65–99)

## 2015-04-06 LAB — URINE MICROSCOPIC-ADD ON: RBC / HPF: NONE SEEN RBC/hpf (ref 0–5)

## 2015-04-06 LAB — PROTIME-INR
INR: 1.06 (ref 0.00–1.49)
Prothrombin Time: 14 seconds (ref 11.6–15.2)

## 2015-04-06 LAB — AMMONIA: AMMONIA: 24 umol/L (ref 9–35)

## 2015-04-06 LAB — APTT: APTT: 26 s (ref 24–37)

## 2015-04-06 MED ORDER — SODIUM CHLORIDE 0.9 % IJ SOLN
3.0000 mL | Freq: Two times a day (BID) | INTRAMUSCULAR | Status: DC
  Administered 2015-04-06 – 2015-04-09 (×5): 3 mL via INTRAVENOUS

## 2015-04-06 MED ORDER — SODIUM CHLORIDE 0.45 % IV SOLN
INTRAVENOUS | Status: DC
  Administered 2015-04-06 – 2015-04-07 (×2): via INTRAVENOUS

## 2015-04-06 MED ORDER — MEMANTINE HCL ER 28 MG PO CP24
28.0000 mg | ORAL_CAPSULE | Freq: Every day | ORAL | Status: DC
  Administered 2015-04-07 – 2015-04-09 (×3): 28 mg via ORAL
  Filled 2015-04-06 (×3): qty 1

## 2015-04-06 MED ORDER — VITAMIN D3 25 MCG (1000 UNIT) PO TABS
1000.0000 [IU] | ORAL_TABLET | Freq: Every day | ORAL | Status: DC
  Administered 2015-04-07 – 2015-04-09 (×3): 1000 [IU] via ORAL
  Filled 2015-04-06 (×6): qty 1

## 2015-04-06 MED ORDER — ENOXAPARIN SODIUM 30 MG/0.3ML ~~LOC~~ SOLN
30.0000 mg | SUBCUTANEOUS | Status: DC
  Administered 2015-04-07: 30 mg via SUBCUTANEOUS
  Filled 2015-04-06: qty 0.3

## 2015-04-06 MED ORDER — SODIUM CHLORIDE 0.9 % IV BOLUS (SEPSIS)
1000.0000 mL | Freq: Once | INTRAVENOUS | Status: AC
  Administered 2015-04-06: 1000 mL via INTRAVENOUS

## 2015-04-06 MED ORDER — MIRTAZAPINE 15 MG PO TABS
7.5000 mg | ORAL_TABLET | Freq: Every day | ORAL | Status: DC
  Administered 2015-04-06 – 2015-04-08 (×3): 7.5 mg via ORAL
  Filled 2015-04-06 (×3): qty 1

## 2015-04-06 MED ORDER — SERTRALINE HCL 25 MG PO TABS
25.0000 mg | ORAL_TABLET | Freq: Every day | ORAL | Status: DC
  Administered 2015-04-07 – 2015-04-09 (×3): 25 mg via ORAL
  Filled 2015-04-06 (×3): qty 1

## 2015-04-06 NOTE — ED Notes (Signed)
Phone report given to [facility] Transport Team 

## 2015-04-06 NOTE — ED Notes (Signed)
Pt will follow commands, opens eyes and answered questions

## 2015-04-06 NOTE — ED Provider Notes (Signed)
CSN: [id number]     Arrival date & time 04/06/15  1648 History   First MD Initiated Contact with Patient 04/06/15 1655     Chief Complaint  Patient presents with  . Altered Mental Status     (Consider location/radiation/quality/duration/timing/severity/associated sxs/prior Treatment) Patient is a 81 y.o. female presenting with altered mental status. The history is provided by the patient.  Altered Mental Status Presenting symptoms: behavior changes, combativeness, confusion, disorientation, lethargy and partial responsiveness   Severity:  Moderate Most recent episode:  Today Episode history:  Multiple Duration:  6 hours Timing:  Intermittent Progression:  Waxing and waning Chronicity:  New Context: dementia   Context: not a nursing home resident (moved home with family 16 days ago)   Associated symptoms: agitation   Associated symptoms: no bladder incontinence, no difficulty breathing, no fever, no seizures and no vomiting   Associated symptoms comment:  Garbled speech   Past Medical History  Diagnosis Date  . Dementia   . Pacemaker   . OA (osteoarthritis)    Past Surgical History  Procedure Laterality Date  . Knee arthroplasty Bilateral   . Carpal tunnel release Bilateral   . Brain surgery  2013  . Atrial cardiac pacemaker insertion  2012  . Flexible sigmoidoscopy N/A 09/12/2014    Procedure: FLEXIBLE SIGMOIDOSCOPY;  Surgeon: Beverley FiedlerJay M Pyrtle, MD;  Location: [facility];  Service: Gastroenterology;  Laterality: N/A;   Family History  Problem Relation Age of Onset  . Heart disease Son   . Cancer Son 60    on Liver  . Colon polyps Neg Hx   . Diabetes Neg Hx   . Kidney disease Neg Hx   . Heart attack Son   . Alzheimer's disease Mother   . Alzheimer's disease Father    Social History  Substance Use Topics  . Smoking status: Never Smoker   . Smokeless tobacco: Never Used  . Alcohol Use: No   OB History    No data available     Review of Systems   Constitutional: Negative for fever.  Gastrointestinal: Negative for vomiting.  Genitourinary: Negative for bladder incontinence.  Neurological: Negative for seizures.  Psychiatric/Behavioral: Positive for confusion and agitation.  All other systems reviewed and are negative.     Allergies  Ativan and Benzodiazepines  Home Medications   Prior to Admission medications   Medication Sig Start Date End Date Taking? Authorizing Provider  cholecalciferol (VITAMIN D) 1000 UNITS tablet Take 1 tablet (1,000 Units total) by mouth daily. 03/30/15  Yes Sharon SellerJessica K Eubanks, NP  memantine (NAMENDA XR) 28 MG CP24 24 hr capsule Take 1 capsule (28 mg total) by mouth daily. 09/06/14  Yes Sharon SellerJessica K Eubanks, NP  mirtazapine (REMERON) 7.5 MG tablet Take 1 tablet (7.5 mg total) by mouth at bedtime. 03/27/15  Yes Tiffany L Reed, DO  sertraline (ZOLOFT) 25 MG tablet Take 1 tablet (25 mg total) by mouth daily. 09/06/14  Yes Sharon SellerJessica K Eubanks, NP   BP 113/75 mmHg  Pulse 72  Temp(Src) 97.8 F (36.6 C) (Oral)  Resp 14  SpO2 100% Physical Exam  Constitutional: She appears well-developed and well-nourished. She appears listless. No distress.  HENT:  Head: Normocephalic.  Eyes: Conjunctivae are normal.  Neck: Neck supple. No tracheal deviation present.  Cardiovascular: Normal rate and regular rhythm.   Pulmonary/Chest: Effort normal. No respiratory distress.  Abdominal: Soft. She exhibits no distension. There is tenderness (which resolves with iomprovement of Pt's mental status) in the suprapubic area. There is  no rigidity, no guarding, no tenderness at McBurney's point and negative Murphy's sign.  Neurological: She has normal strength. She appears listless. She is disoriented. She displays no tremor. GCS eye subscore is 2. GCS verbal subscore is 3. GCS motor subscore is 5.  Skin: Skin is warm and dry.  Psychiatric: She has a normal mood and affect.    ED Course  Procedures (including critical care  time) Labs Review Labs Reviewed  CBC WITH DIFFERENTIAL/PLATELET - Abnormal; Notable for the following:    RBC 3.73 (*)    Hemoglobin 11.4 (*)    HCT 35.9 (*)    All other components within normal limits  COMPREHENSIVE METABOLIC PANEL - Abnormal; Notable for the following:    Glucose, Bld 218 (*)    BUN 32 (*)    Creatinine, Ser 1.02 (*)    GFR calc non Af Amer 47 (*)    GFR calc Af Amer 54 (*)    All other components within normal limits  URINALYSIS, ROUTINE W REFLEX MICROSCOPIC (NOT AT [facility]) - Abnormal; Notable for the following:    Color, Urine AMBER (*)    APPearance CLOUDY (*)    Bilirubin Urine SMALL (*)    Protein, ur 30 (*)    All other components within normal limits  URINE MICROSCOPIC-ADD ON - Abnormal; Notable for the following:    Squamous Epithelial / LPF 0-5 (*)    Bacteria, UA RARE (*)    All other components within normal limits  CBG MONITORING, ED - Abnormal; Notable for the following:    Glucose-Capillary 214 (*)    All other components within normal limits  I-STAT CG4 LACTIC ACID, ED - Abnormal; Notable for the following:    Lactic Acid, Venous 3.20 (*)    All other components within normal limits  AMMONIA  TROPONIN I  PROTIME-INR  APTT  CBG MONITORING, ED    Imaging Review Ct Head Wo Contrast  04/06/2015  CLINICAL DATA:  Altered mental status, difficulty walking EXAM: CT HEAD WITHOUT CONTRAST TECHNIQUE: Contiguous axial images were obtained from the base of the skull through the vertex without intravenous contrast. COMPARISON:  12/01/2014 FINDINGS: Motion degraded images. No evidence of parenchymal hemorrhage or extra-axial fluid collection. No mass lesion, mass effect, or midline shift. No CT evidence of acute infarction. Subcortical white matter and periventricular small vessel ischemic changes. Intracranial atherosclerosis. Global cortical and central atrophy.  No ventriculomegaly. The visualized paranasal sinuses are essentially clear. The mastoid air  cells are unopacified. No evidence of calvarial fracture. IMPRESSION: No evidence of acute intracranial abnormality. Atrophy with small vessel ischemic changes. Electronically Signed   By: Charline Bills M.D.   On: 04/06/2015 17:32   Dg Abd Acute W/chest  04/06/2015  CLINICAL DATA:  Altered mental status. EXAM: DG ABDOMEN ACUTE W/ 1V CHEST COMPARISON:  CT of the abdomen and pelvis 08/24/2014. FINDINGS: The heart size is normal. Atherosclerotic changes are present at the aortic arch. Lungs are clear. The bowel gas pattern is normal. There is no free air or obstruction. Multilevel degenerative changes are noted throughout the thoracic spine. Pacing wires are stable. IMPRESSION: 1. No acute cardiopulmonary disease. 2. Normal bowel gas pattern. 3. Atherosclerosis. Electronically Signed   By: Marin Roberts M.D.   On: 04/06/2015 17:48   I have personally reviewed and evaluated these images and lab results as part of my medical decision-making.   EKG Interpretation   Date/Time:  Thursday April 06 2015 17:10:15 EST Ventricular Rate:  81  PR Interval:  152 QRS Duration: 110 QT Interval:  386 QTC Calculation: 448 R Axis:   -15 Text Interpretation:  Sinus rhythm with occasional Premature ventricular  complexes Low voltage QRS Right bundle branch block Abnormal ECG pacer  spikes now present intermittently Confirmed by Tae Robak MD, Reuel Boom ([id number]) on  04/06/2015 5:40:56 PM      MDM   Final diagnoses:  Altered mental status   81 y.o. female presents with waxing and waning altered mental status over the course of the afternoon. Per family who is available at bedside to provide the history the patient is able to partially dressed herself, feed herself but not prepare food or perform typical ADLs.   She was released from an assisted living facility 16 days ago and has been living at home ever since. She has known Alzheimer dementia. Today in afternoon after attending an appointment the patient  became acutely somnolent in the afternoon, rolled her eyes back in her head, was difficult to arouse and began blurting out nonsensical language and her granddaughter. This episode subsided and went back to her baseline, this recurred and the patient reached for the stick shift of the vehicle from the passenger side. She is normally ambulatory and has not been able to walk since that last episode started. Her mental status is not resolved.  On arrival she is responsive to pain and able to localize but is minimally compliant with the exam. She does have some mild tenderness over her abdomen but is difficult to examine. She is refusing to open her eyes. Screening labs, head CT and chest and abdominal x-rays were ordered for workup of undifferentiated altered mental status.  Following lab analysis the only significant abnormality is a mild lactic acidosis of uncertain etiology. Initially with abdominal pain considered mesenteric ischemia but patient's mental status was improving, reexamined her abdomen which was no longer tender. She was able to interact with staff at her confused baseline and attempt to scare them when examining pupils which caused her to laugh. Daughter expressing concerns that symptoms may recur at home and does not feel comfortable caring for patient at home. She is provided IV fluids for mild hypotension on arrival and lactic acidosis suggestive of possible dehydration or other unidentified etiology. She will require further observation on an inpatient basis to elucidate. Hospitalist was consulted for admission and accepted the patient in transfer to facility capable of caring for patient further.   Lyndal Pulley, MD 04/06/15 [phone]

## 2015-04-06 NOTE — ED Notes (Signed)
Please call grand daughter/poa with updates...[phone] Rhae HammockArmina

## 2015-04-06 NOTE — Telephone Encounter (Signed)
Granddaughter called and stated that patient went to foot dr and they stopped at the drivethrought to get something to eat and patient fell asleep and tried to wake up and eyes rolled back in head, talking jibberous and sweats. Had another episode and this time fell asleep and woke up screaming and pulled the car in neutral. Stated that patient is acting very funny and not herself. Instructed her to take her to ER and she agreed. She stated that it had gotten so bad that she was going to pull over on side of road and call ambulance.

## 2015-04-06 NOTE — ED Notes (Signed)
Pt presents with daughter who states patient's "eyes rolled into the back of her head at 1500, she was struggling to wake up" - Granddaughter reports "patient went out" - pt has Alzheimer's. Pt will currently yell out with painful stimuli.

## 2015-04-06 NOTE — Progress Notes (Signed)
Patient ID: Sherri RadMable Leonard, female   DOB: 05/20/1921, 81 y.o.   MRN: [id number] The case was discussed with dr. Clydene PughKnott and the chart was reviewed. The [facility] due to altered MS and lactic acidosis.

## 2015-04-06 NOTE — Progress Notes (Signed)
Report received from Casimiro NeedleMichael, ED RN [facility].

## 2015-04-06 NOTE — ED Notes (Signed)
Pt resting quietly, opens eyes to name called, pt oriented to name only at this time. Per report this is baseline for this patient

## 2015-04-06 NOTE — H&P (Signed)
[facility] History and Physical  Sherri Leonard ZOX:[id number] DOB: 10/28/1920 DOA: 04/06/2015  Referring physician: Lyndal Pulleyaniel Knott, M.D. PCP: Sharon SellerEUBANKS, JESSICA K, NP   Chief Complaint: Altered mental status.  HPI: Sherri RadMable Leonard is a 81 y.o. female with a past medical history of Alzheimer's dementia who was [facility] to [facility] due to waxing and waning altered mental status during the afternoon. Per medical records and patient was released from an assisted living facility 16 days ago and has been living at home since then. Relatives state that the patient has apparently rolled her eyes back in her head, was difficult to arouse and started talking in sentences that wouldn't make any sense. Relatives stated that she went back to her normal mental status and then did a second episode while the patient was soft from passenger in a relative's vehicle. Apparently the patient started to be restless, started talking in sentences that did not make much sense and try to reach for the stick shift of the vehicle. Since then the patient was unable to walk. Family members stated that usually the patient is able to assist herself in the dressing, able to walk and do simple task, but has been unable to do so since the second instance of symptoms . Workup is significant for lactic acidosis. When seen, the patient was awake alert oriented 1 and was in no acute distress.  Review of Systems:  Unable to fully evaluate due to the patient's mental status.  Past Medical History  Diagnosis Date  . Dementia   . Pacemaker   . OA (osteoarthritis)    Past Surgical History  Procedure Laterality Date  . Knee arthroplasty Bilateral   . Carpal tunnel release Bilateral   . Brain surgery  2013  . Atrial cardiac pacemaker insertion  2012  . Flexible sigmoidoscopy N/A 09/12/2014    Procedure: FLEXIBLE SIGMOIDOSCOPY;  Surgeon: Beverley FiedlerJay M Pyrtle, MD;  Location: [facility];  Service: Gastroenterology;  Laterality: N/A;     Social History:  reports that she has never smoked. She has never used smokeless tobacco. She reports that she does not drink alcohol or use illicit drugs.  Allergies  Allergen Reactions  . Ativan [Lorazepam] Other (See Comments)    Causes violence   . Benzodiazepines     Family History  Problem Relation Age of Onset  . Heart disease Son   . Cancer Son 60    on Liver  . Colon polyps Neg Hx   . Diabetes Neg Hx   . Kidney disease Neg Hx   . Heart attack Son   . Alzheimer's disease Mother   . Alzheimer's disease Father     Prior to Admission medications   Medication Sig Start Date End Date Taking? Authorizing Provider  cholecalciferol (VITAMIN D) 1000 UNITS tablet Take 1 tablet (1,000 Units total) by mouth daily. 03/30/15  Yes Sharon SellerJessica K Eubanks, NP  memantine (NAMENDA XR) 28 MG CP24 24 hr capsule Take 1 capsule (28 mg total) by mouth daily. 09/06/14  Yes Sharon SellerJessica K Eubanks, NP  mirtazapine (REMERON) 7.5 MG tablet Take 1 tablet (7.5 mg total) by mouth at bedtime. 03/27/15  Yes Tiffany L Reed, DO  sertraline (ZOLOFT) 25 MG tablet Take 1 tablet (25 mg total) by mouth daily. 09/06/14  Yes Sharon SellerJessica K Eubanks, NP   Physical Exam: Filed Vitals:   04/06/15 1933 04/06/15 2018 04/06/15 2135 04/06/15 2141  BP: 116/58 120/73  135/59  Pulse: 70 80  67  Temp:  97.9 F (36.6  C)  97.7 F (36.5 C)  TempSrc:  Oral  Oral  Resp: Height:    (1.6 m)   Weight:   53.5 kg (117 lb 15.1 oz)   SpO2: 97% 99%  100%    Wt Readings from Last 3 Encounters:  04/06/15 53.5 kg (117 lb 15.1 oz)  03/27/15 54.795 kg (120 lb 12.8 oz)  09/29/14 54.432 kg (120 lb)    General:  Appears calm and comfortable Eyes: PERRL, normal lids, irises & conjunctiva ENT: grossly normal hearing, lips and oral mucosa are dry. Neck: no LAD, masses or thyromegaly Cardiovascular: RRR, no m/r/g. No LE edema. Telemetry: SR, no arrhythmias  Respiratory: CTA bilaterally, no w/r/r. Normal respiratory  effort. Abdomen: soft, ntnd Skin: no rash or induration seen on limited exam Musculoskeletal: grossly normal tone BUE/BLE Psychiatric: Awake alert oriented 1 Neurologic: Unable to fully evaluate, but is grossly non-focal.          Labs on Admission:  Basic Metabolic Panel:  Recent Labs Lab 04/06/15 1700  NA 140  K 4.3  CL 105  CO2 29  GLUCOSE 218*  BUN 32*  CREATININE 1.02*  CALCIUM 9.0   Liver Function Tests:  Recent Labs Lab 04/06/15 1700  AST 24  ALT 17  ALKPHOS 74  BILITOT 0.5  PROT 7.1  ALBUMIN 3.5    Recent Labs Lab 04/06/15 1745  AMMONIA 24   CBC:  Recent Labs Lab 04/06/15 1700  WBC 5.4  NEUTROABS 2.8  HGB 11.4*  HCT 35.9*  MCV 96.2  PLT 151   Cardiac Enzymes:  Recent Labs Lab 04/06/15 1700  TROPONINI <0.03     CBG:  Recent Labs Lab 04/06/15 1654  GLUCAP 214*    Radiological Exams on Admission: Ct Head Wo Contrast  04/06/2015  CLINICAL DATA:  Altered mental status, difficulty walking EXAM: CT HEAD WITHOUT CONTRAST TECHNIQUE: Contiguous axial images were obtained from the base of the skull through the vertex without intravenous contrast. COMPARISON:  12/01/2014 FINDINGS: Motion degraded images. No evidence of parenchymal hemorrhage or extra-axial fluid collection. No mass lesion, mass effect, or midline shift. No CT evidence of acute infarction. Subcortical white matter and periventricular small vessel ischemic changes. Intracranial atherosclerosis. Global cortical and central atrophy.  No ventriculomegaly. The visualized paranasal sinuses are essentially clear. The mastoid air cells are unopacified. No evidence of calvarial fracture. IMPRESSION: No evidence of acute intracranial abnormality. Atrophy with small vessel ischemic changes. Electronically Signed   By: Charline Bills M.D.   On: 04/06/2015 17:32   Dg Abd Acute W/chest  04/06/2015  CLINICAL DATA:  Altered mental status. EXAM: DG ABDOMEN ACUTE W/ 1V CHEST COMPARISON:  CT  of the abdomen and pelvis 08/24/2014. FINDINGS: The heart size is normal. Atherosclerotic changes are present at the aortic arch. Lungs are clear. The bowel gas pattern is normal. There is no free air or obstruction. Multilevel degenerative changes are noted throughout the thoracic spine. Pacing wires are stable. IMPRESSION: 1. No acute cardiopulmonary disease. 2. Normal bowel gas pattern. 3. Atherosclerosis. Electronically Signed   By: Marin Roberts M.D.   On: 04/06/2015 17:48    EKG: Independently reviewed. Vent. rate 81 BPM PR interval 152 ms QRS duration 110 ms QT/QTc 386/448 ms P-R-T axes 68 -15 36 Sinus rhythm with occasional Premature ventricular complexes Low voltage QRS Right bundle branch block Abnormal ECG pacer spikes now present intermittently  Assessment/Plan Principal Problem:   Altered mental status Unsure at this time  what could have caused the symptoms. Will admit to telemetry. Continue gentle IV hydration.  Active Problems:    Lactic acidosis Continue gentle IV hydration. Check lactate level again in the morning.    Dementia with behavioral disturbance Supportive care. Anxiolytics as needed.    Anemia Monitor hematocrit and hemoglobin.    Hyperglycemia  Check CBG before meals hold the patient's in the hospital.   Code Status: Full code. DVT Prophylaxis: Lovenox SQ. Family Communication:  Disposition Plan: Admit to telemetry for close monitoring.  Time spent: Over 70 minutes were spent in the process of this admission.  Bobette Mo [facility] Pager [phone].

## 2015-04-06 NOTE — ED Notes (Signed)
[facility] is aware of room assigned--[facility]W09 [facility]--[facility] with [facility]

## 2015-04-06 NOTE — ED Notes (Signed)
Pt awaiting transport [facility] for [facility]

## 2015-04-06 NOTE — ED Notes (Signed)
Allergy Bracelet placed on pt arm High Fall Risk Bracelet placed on pt arm

## 2015-04-06 NOTE — ED Notes (Signed)
Comfort measures provided, safety measures in place, door left open for cont observation, pt a High Fall Risk Pt

## 2015-04-06 NOTE — ED Notes (Signed)
Phone call made to family in regards to pts departure time and assigned room at [facility]

## 2015-04-07 ENCOUNTER — Inpatient Hospital Stay (HOSPITAL_COMMUNITY): Payer: Medicare Other

## 2015-04-07 DIAGNOSIS — R4182 Altered mental status, unspecified: Secondary | ICD-10-CM

## 2015-04-07 DIAGNOSIS — E872 Acidosis: Secondary | ICD-10-CM

## 2015-04-07 DIAGNOSIS — R400 Somnolence: Secondary | ICD-10-CM

## 2015-04-07 DIAGNOSIS — F0391 Unspecified dementia with behavioral disturbance: Secondary | ICD-10-CM

## 2015-04-07 DIAGNOSIS — R410 Disorientation, unspecified: Secondary | ICD-10-CM

## 2015-04-07 LAB — CBC
HCT: 34.2 % — ABNORMAL LOW (ref 36.0–46.0)
HCT: 35.8 % — ABNORMAL LOW (ref 36.0–46.0)
HEMOGLOBIN: 10.7 g/dL — AB (ref 12.0–15.0)
HEMOGLOBIN: 11.4 g/dL — AB (ref 12.0–15.0)
MCH: 30.2 pg (ref 26.0–34.0)
MCH: 30.5 pg (ref 26.0–34.0)
MCHC: 31.3 g/dL (ref 30.0–36.0)
MCHC: 31.8 g/dL (ref 30.0–36.0)
MCV: 95.7 fL (ref 78.0–100.0)
MCV: 96.6 fL (ref 78.0–100.0)
PLATELETS: 139 10*3/uL — AB (ref 150–400)
Platelets: 145 10*3/uL — ABNORMAL LOW (ref 150–400)
RBC: 3.74 MIL/uL — ABNORMAL LOW (ref 3.87–5.11)
RBC: 3.54 MIL/uL — AB (ref 3.87–5.11)
RDW: 14.9 % (ref 11.5–15.5)
RDW: 15.1 % (ref 11.5–15.5)
WBC: 5.4 10*3/uL (ref 4.0–10.5)
WBC: 5.7 10*3/uL (ref 4.0–10.5)

## 2015-04-07 LAB — COMPREHENSIVE METABOLIC PANEL
ALBUMIN: 3.1 g/dL — AB (ref 3.5–5.0)
ALK PHOS: 70 U/L (ref 38–126)
ALT: 14 U/L (ref 14–54)
AST: 18 U/L (ref 15–41)
Anion gap: 7 (ref 5–15)
BUN: 25 mg/dL — AB (ref 6–20)
CALCIUM: 8.7 mg/dL — AB (ref 8.9–10.3)
CO2: 28 mmol/L (ref 22–32)
CREATININE: 0.7 mg/dL (ref 0.44–1.00)
Chloride: 106 mmol/L (ref 101–111)
GFR calc Af Amer: >60 mL/min (ref >60)
GFR calc non Af Amer: >60 mL/min (ref >60)
GLUCOSE: 89 mg/dL (ref 65–99)
Potassium: 3.7 mmol/L (ref 3.5–5.1)
SODIUM: 141 mmol/L (ref 135–145)
Total Bilirubin: 0.5 mg/dL (ref 0.3–1.2)
Total Protein: 6.8 g/dL (ref 6.5–8.1)

## 2015-04-07 LAB — MAGNESIUM: Magnesium: 1.8 mg/dL (ref 1.7–2.4)

## 2015-04-07 LAB — GLUCOSE, CAPILLARY
GLUCOSE-CAPILLARY: 91 mg/dL (ref 65–99)
GLUCOSE-CAPILLARY: 120 mg/dL — AB (ref 65–99)
Glucose-Capillary: 93 mg/dL (ref 65–99)
Glucose-Capillary: 103 mg/dL — ABNORMAL HIGH (ref 65–99)

## 2015-04-07 LAB — CREATININE, SERUM
Creatinine, Ser: 0.91 mg/dL (ref 0.44–1.00)
GFR calc Af Amer: >60 mL/min (ref >60)
GFR calc non Af Amer: 54 mL/min — ABNORMAL LOW (ref >60)

## 2015-04-07 LAB — PHOSPHORUS: Phosphorus: 3.2 mg/dL (ref 2.5–4.6)

## 2015-04-07 LAB — IRON AND TIBC
Iron: 33 ug/dL (ref 28–170)
SATURATION RATIOS: 18 % (ref 10.4–31.8)
TIBC: 181 ug/dL — ABNORMAL LOW (ref 250–450)
UIBC: 148 ug/dL

## 2015-04-07 LAB — LACTIC ACID, PLASMA: LACTIC ACID, VENOUS: 1.3 mmol/L (ref 0.5–2.0)

## 2015-04-07 LAB — FERRITIN: Ferritin: 303 ng/mL (ref 11–307)

## 2015-04-07 LAB — VITAMIN B12: VITAMIN B 12: 373 pg/mL (ref 180–914)

## 2015-04-07 LAB — TROPONIN I: Troponin I: <0.03 ng/mL (ref <0.031)

## 2015-04-07 LAB — AMMONIA: Ammonia: 32 umol/L (ref 9–35)

## 2015-04-07 LAB — FOLATE: FOLATE: 11.5 ng/mL (ref >5.9)

## 2015-04-07 MED ORDER — DEXTROSE 5 % IV SOLN
1.0000 g | Freq: Every day | INTRAVENOUS | Status: DC
  Administered 2015-04-07 – 2015-04-09 (×3): 1 g via INTRAVENOUS
  Filled 2015-04-07 (×4): qty 10

## 2015-04-07 MED ORDER — AMANTADINE HCL 100 MG PO CAPS
100.0000 mg | ORAL_CAPSULE | Freq: Every day | ORAL | Status: DC
  Administered 2015-04-07 – 2015-04-08 (×2): 100 mg via ORAL
  Filled 2015-04-07 (×3): qty 1

## 2015-04-07 MED ORDER — ASPIRIN 81 MG PO CHEW
81.0000 mg | CHEWABLE_TABLET | Freq: Every day | ORAL | Status: DC
  Administered 2015-04-07 – 2015-04-09 (×3): 81 mg via ORAL
  Filled 2015-04-07 (×3): qty 1

## 2015-04-07 MED ORDER — ENOXAPARIN SODIUM 40 MG/0.4ML ~~LOC~~ SOLN
40.0000 mg | SUBCUTANEOUS | Status: DC
  Administered 2015-04-08 – 2015-04-09 (×2): 40 mg via SUBCUTANEOUS
  Filled 2015-04-07 (×2): qty 0.4

## 2015-04-07 NOTE — Evaluation (Signed)
Physical Therapy Evaluation Patient Details Name: Sherri Leonard MRN: [id number] DOB: 11/24/1920 Today's Date: 04/07/2015   History of Present Illness  81 y.o. female with history of dementia admitted with encephalopathy, lactic acidosis, and questionable UTI.  Clinical Impression  Pt admitted with the above complications. Pt currently with functional limitations due to the deficits listed below (see PT Problem List). Somewhat lethargic initially but became more alert with activity. Oriented to person only but able to recall location at end of therapy session after she was re-oriented early on. Required mod assist for safe transfer to stand and with pivot to [facility]. Very delayed response to simple commands but fairly consistent. Unable to provide a history for her PLOF or any family that assists her. She was incontinent of some urine but able to transfer to [facility] and produce further urine. Pt will benefit from skilled PT to increase their independence and safety with mobility to allow discharge to the venue listed below.       Follow Up Recommendations SNF;Supervision/Assistance - 24 hour    Equipment Recommendations   (TBD)    Recommendations for Other Services       Precautions / Restrictions Precautions Precautions: Fall Restrictions Weight Bearing Restrictions: No      Mobility  Bed Mobility Overal bed mobility: Needs Assistance Bed Mobility: Supine to Sit;Sit to Supine     Supine to sit: Min assist Sit to supine: Min assist   General bed mobility comments: Min assist for LE support in/out of bed with VC for technique and to initiate desired movement  Transfers Overall transfer level: Needs assistance Equipment used: Rolling walker (2 wheeled) Transfers: Sit to/from [employer]Stand;Stand Pivot Transfers Sit to Stand: Mod assist Stand pivot transfers: Mod assist       General transfer comment: Mod assist for boost and balance with heavy posterior lean. Requires a lot of time to  process simple instructions for sit<>stand transitions. Cues for hand placement. Performed from bed and BSC. Mod assist for balance with pivot to and from bed/BSC. No buckling noted. difficulty with sequencing.  Ambulation/Gait                Stairs            Wheelchair Mobility    Modified Rankin (Stroke Patients Only)       Balance Overall balance assessment: Needs assistance Sitting-balance support: No upper extremity supported;Feet supported Sitting balance-Leahy Scale: Fair   Postural control: Posterior lean Standing balance support: Bilateral upper extremity supported Standing balance-Leahy Scale: Poor                               Pertinent Vitals/Pain Pain Assessment:  (PAINAD 2/10)    Home Living Family/patient expects to be discharged to:: Unsure                 Additional Comments: Patient unable to provide a history. From ED note, she was at an ALF until recently when she transitioned to independent living.    Prior Function           Comments: Patient unable to provide a history.  No family immediately available.     Hand Dominance        Extremity/Trunk Assessment   Upper Extremity Assessment: Defer to OT evaluation           Lower Extremity Assessment: Difficult to assess due to impaired cognition (no focal deficits noticed)  Communication   Communication: No difficulties  Cognition Arousal/Alertness: Lethargic Behavior During Therapy: Flat affect Overall Cognitive Status: No family/caregiver present to determine baseline cognitive functioning Area of Impairment: Orientation;Following commands;Problem solving Orientation Level: Disoriented to;Place;Time;Situation     Following Commands: Follows one step commands consistently;Follows one step commands with increased time     Problem Solving: Slow processing;Decreased initiation;Difficulty sequencing;Requires verbal cues;Requires tactile  cues General Comments: Once oriented to location, pt able to recall at end of therapy session. Does recall name.    General Comments General comments (skin integrity, edema, etc.): with urinary incontinence. linens changed.    Exercises        Assessment/Plan    PT Assessment Patient needs continued PT services  PT Diagnosis Abnormality of gait;Difficulty walking;Generalized weakness   PT Problem List Decreased strength;Decreased activity tolerance;Decreased balance;Decreased mobility;Decreased coordination;Decreased knowledge of use of DME;Decreased safety awareness;Decreased cognition  PT Treatment Interventions DME instruction;Gait training;Functional mobility training;Therapeutic activities;Therapeutic exercise;Balance training;Neuromuscular re-education;Cognitive remediation;Patient/family education   PT Goals (Current goals can be found in the Care Plan section) Acute Rehab PT Goals Patient Stated Goal: none stated PT Goal Formulation: Patient unable to participate in goal setting Time For Goal Achievement: 04/21/15 Potential to Achieve Goals: Fair    Frequency Min 2X/week   Barriers to discharge Decreased caregiver support unsure of accurate living arrangements    Co-evaluation               End of Session Equipment Utilized During Treatment: Gait belt Activity Tolerance: Other (comment) (Limited by cognition) Patient left: in bed;with call bell/phone within reach;with bed alarm set Nurse Communication: Mobility status         Time: 1610-9604 PT Time Calculation (min) (ACUTE ONLY): 20 min   Charges:   PT Evaluation $Initial PT Evaluation Tier I: 1 Procedure     PT G CodesBerton Mount 04/07/2015, 1:35 PM Charlsie Merles, [place] [phone]

## 2015-04-07 NOTE — NC FL2 (Signed)
[place] MEDICAID FL2 LEVEL OF CARE SCREENING TOOL     IDENTIFICATION  Patient Name: Sherri Leonard Birthdate: 10/11/1920 Sex: female Admission Date (Current Location): 04/06/2015  [facility] [place]   Facility and Address:  [facility], [address], [place], [place] [address]      Provider Number: [id number]  Attending Physician Name and Address:  Leroy SeaPrashant K Singh, MD  Relative Name and Phone Number:  Ranee GosselinArmina Wageman, Granddaughter    Current Level of Care: SNF Recommended Level of Care: Skilled Nursing Facility Prior Approval Number:    Date Approved/Denied:   PASRR Number:   [id number]  Discharge Plan: SNF    Current Diagnoses: Patient Active Problem List   Diagnosis Date Noted  . Altered mental status 04/06/2015  . Anemia 04/06/2015  . Hyperglycemia 04/06/2015  . Lactic acidosis 04/06/2015  . Abnormal abdominal CT scan   . Dementia with behavioral disturbance 09/06/2014  . Bradycardia 09/06/2014  . Abnormal findings on radiological examination of gastrointestinal tract 08/30/2014  . Rectal pain 08/30/2014    Orientation ACTIVITIES/SOCIAL BLADDER RESPIRATION    Self  Family supportive Incontinent Normal  BEHAVIORAL SYMPTOMS/MOOD NEUROLOGICAL BOWEL NUTRITION STATUS      Continent  (See DC Summary)  PHYSICIAN VISITS COMMUNICATION OF NEEDS Height & Weight Skin    Verbally 5\' 3"  (160 cm) 117 lbs. Normal          AMBULATORY STATUS RESPIRATION    Assist extensive Normal      Personal Care Assistance Level of Assistance  Bathing, Feeding, Dressing Bathing Assistance: Limited assistance Feeding assistance: Limited assistance Dressing Assistance: Limited assistance      Functional Limitations Info                SPECIAL CARE FACTORS FREQUENCY  PT (By licensed PT)     PT Frequency: 5x/Week             Additional Factors Info  Code Status, Allergies Code Status Info: Full Allergies  Info: Ativan; Benzodiazepines           Current Medications (04/07/2015): Current Facility-Administered Medications  Medication Dose Route Frequency Provider Last Rate Last Dose  . amantadine (SYMMETREL) capsule 100 mg  100 mg Oral Daily Ram Daniel NonesNarayan Kaveer Nandigam, MD   100 mg at 04/07/15 1506  . aspirin chewable tablet 81 mg  81 mg Oral Daily Leroy SeaPrashant K Singh, MD   81 mg at 04/07/15 1225  . cefTRIAXone (ROCEPHIN) 1 g in dextrose 5 % 50 mL IVPB  1 g Intravenous Q0600 Leroy SeaPrashant K Singh, MD   1 g at 04/07/15 0746  . cholecalciferol (VITAMIN D) tablet 1,000 Units  1,000 Units Oral Daily Bobette Moavid Manuel Ortiz, MD   1,000 Units at 04/07/15 1224  . [START ON 04/08/2015] enoxaparin (LOVENOX) injection 40 mg  40 mg Subcutaneous Q24H Lynita LombardJennifer D Lewis Run[place], [facility] CenterRPH      . memantine (NAMENDA XR) 24 hr capsule 28 mg  28 mg Oral Daily Bobette Moavid Manuel Ortiz, MD   28 mg at 04/07/15 1224  . mirtazapine (REMERON) tablet 7.5 mg  7.5 mg Oral QHS Bobette Moavid Manuel Ortiz, MD   7.5 mg at 04/06/15 2335  . sertraline (ZOLOFT) tablet 25 mg  25 mg Oral Daily Bobette Moavid Manuel Ortiz, MD   25 mg at 04/07/15 1225  . sodium chloride 0.9 % injection 3 mL  3 mL Intravenous Q12H Bobette Moavid Manuel Ortiz, MD   3 mL at 04/06/15 2339   Do not use  this list as official medication orders. Please verify with discharge summary.  Discharge Medications:   Medication List    ASK your doctor about these medications        carbamazepine 100 MG chewable tablet  Commonly known as:  TEGRETOL  Chew 100 mg by mouth 2 (two) times daily.     cholecalciferol 1000 UNITS tablet  Commonly known as:  VITAMIN D  Take 1 tablet (1,000 Units total) by mouth daily.     memantine 28 MG Cp24 24 hr capsule  Commonly known as:  NAMENDA XR  Take 1 capsule (28 mg total) by mouth daily.     mirtazapine 7.5 MG tablet  Commonly known as:  REMERON  Take 1 tablet (7.5 mg total) by mouth at bedtime.     sertraline 25 MG tablet  Commonly known as:  ZOLOFT  Take 1 tablet (25  mg total) by mouth daily.     Vitamin D (Ergocalciferol) 50000 UNITS Caps capsule  Commonly known as:  DRISDOL  1,000 Units once a week.        Relevant Imaging Results:  Relevant Lab Results:  Recent Labs    Additional Information    Renne Crigler Luretta Everly, LCSW

## 2015-04-07 NOTE — Progress Notes (Addendum)
Patient has not voided since this morning and is incontinent. Attempted to encourage patient to urinate by placing on the bedpan but patient stated she did not have to urinate. Patient bladder scanned and 316ml noted in bladder. Dr. Thedore MinsSingh paged and telephone order to in and out catheterize patient once to obtain urine culture.   3:02 PM Attempted to in and out catheterize patient, but patient would not relax legs enough to successful complete. Patient wiggling in bed and could not rationalize with to explain the procedure.

## 2015-04-07 NOTE — Progress Notes (Signed)
Subjective:     Patient ID: Sherri Leonard, female   DOB: 08/20/1920, 81 y.o.   MRN: [id number]  HPI patient presents with discolored hallux nails bilateral with caregiver concerned about the color that is been present for about 2-4 weeks   Review of Systems  All other systems reviewed and are negative.      Objective:   Physical Exam  Constitutional: She is oriented to person, place, and time.  Cardiovascular: Intact distal pulses.   Musculoskeletal: Normal range of motion.  Neurological: She is oriented to person, place, and time.  Skin: Skin is warm.  Nursing note and vitals reviewed.  neurovascular status was found to be intact with muscle strength adequate range of motion within normal limits. Patient's noted to have good digital perfusion is well oriented and does have slight drainage if she traumatized is the nailbeds. No current drainage noted     Assessment:     Mycotic nail with probable trauma no indications of other pathology    Plan:     H&P condition reviewed with patient and discussed. Patient will be seen back for us to recheck again as needed if the symptoms were to get worse or drainage or pain were to occur

## 2015-04-07 NOTE — Clinical Social Work Note (Signed)
Clinical Social Work Assessment  Patient Details  Name: Sherri Leonard MRN: [id number] Date of Birth: 11/21/1920  Date of referral:  04/07/15               Reason for consult:  Facility Placement                Permission sought to share information with:  Family Supports Permission granted to share information::  No (pt has dementia/ disoriented)  Name::     Engineer, drillingArmina  [facility]  Relationship::  granddaughter  Contact Information:     Housing/Transportation Living arrangements for the past 2 months:  Single Family Home Source of Information:  Other (Comment Required) (granddaughter) Patient Interpreter Needed:  None Criminal Activity/Legal Involvement Pertinent to Current Situation/Hospitalization:  No - Comment as needed Significant Relationships:  Other Family Members Lives with:  Other (Comment) (granddaughter) Do you feel safe going back to the place where you live?  Yes Need for family participation in patient care:  Yes (Comment) (granddaughter is primary caregiver)  Care giving concerns:  Pt lives at home with granddaughter- pt requiring more assistance than available at this time   Office managerocial Worker assessment / plan:  CSW discussed PT recommendation for SNF  Employment status:  Retired Health and safety inspectornsurance information:  Medicare PT Recommendations:  Skilled Nursing Facility Information / Referral to community resources:     Patient/Family's Response to care:  [place]- would like for pt to regain strength in order to transition back home.  Granddaughter reports that pt was at ALF in recent past but she took the pt out of the facility when they were not caring for her sufficiently and took the pt home with her.  Patient/Family's Understanding of and Emotional Response to Diagnosis, Current Treatment, and Prognosis:  Granddaughter seems to have clear understanding- hopeful pt can return home soon.  Emotional Assessment Appearance:   Appears stated age Attitude/Demeanor/Rapport:  Unable to Assess Affect (typically observed):  Unable to Assess Orientation:  Fluctuating Orientation (Suspected and/or reported Sundowners), Oriented to Self Alcohol / Substance use:  Not Applicable Psych involvement (Current and /or in the community):  No (Comment)  Discharge Needs  Concerns to be addressed:  Care Coordination Readmission within the last 30 days:  No Current discharge risk:  Physical Impairment Barriers to Discharge:  Continued Medical Work up   Izora RibasHoloman, April Carlyon M, LCSW 04/07/2015, 5:07 PM

## 2015-04-07 NOTE — Progress Notes (Signed)
Patient Demographics:    Sherri Leonard, is a 81 y.o. female, DOB - 09/01/1920, ZHY:[id number]  Admit date - 04/06/2015   Admitting Physician Bobette Moavid Manuel Ortiz, MD  Outpatient Primary MD for the patient is Sharon SellerEUBANKS, JESSICA K, NP  LOS - 1   Chief Complaint  Patient presents with  . Altered Mental Status        Subjective:    Sherri Leonard today has, No headache, No chest pain, No abdominal pain - No Nausea, No new weakness tingling or numbness, No Cough - SOB. Appears to be a poor historian.   Assessment  & Plan :    1. Encephalopathy. Rule out seizure, head CT stable, has underlying dementia but symptoms were out of character. EEG pending. No focal deficits on exam, no headache, neurology requested to evaluate the patient. Will place on low-dose aspirin for now.  2. Questionable UTI with borderline UA. Placed on Rocephin and follow cultures.   3. Lactic acidosis. Likely due to dehydration. Resolved after IV fluids.   4. Underlying dementia. At risk for delirium, home medications continued. Minimize narcotics and benzodiazepine use.   Code Status : Full  Family Communication  : None present  Disposition Plan  : To be decided  Consults  :  Neurology  Procedures  :   CT head nonacute  EEG pending  DVT Prophylaxis  :  Lovenox   Lab Results  Component Value Date   PLT 139* 04/07/2015    Inpatient Medications  Scheduled Meds: . cefTRIAXone (ROCEPHIN)  IV  1 g Intravenous Q0600  . cholecalciferol  1,000 Units Oral Daily  . enoxaparin (LOVENOX) injection  30 mg Subcutaneous Q24H  . memantine  28 mg Oral Daily  . mirtazapine  7.5 mg Oral QHS  . sertraline  25 mg Oral Daily  . sodium chloride  3 mL Intravenous Q12H   Continuous Infusions:  PRN Meds:.  Antibiotics  :      Anti-infectives    Start     Dose/Rate Route Frequency Ordered Stop   04/07/15 0700  cefTRIAXone (ROCEPHIN) 1 g in dextrose 5 % 50 mL IVPB     1 g 100 mL/hr over 30 Minutes Intravenous Daily 04/07/15 0615          Objective:   Filed Vitals:   04/06/15 2018 04/06/15 2135 04/06/15 2141 04/07/15 0459  BP: 120/73  135/59 153/61  Pulse: 80  67 60  Temp: 97.9 F (36.6 C)  97.7 F (36.5 C) 98.1 F (36.7 C)  TempSrc: Oral  Oral Oral  Resp: 14  20 13   Height:  5\' 3"  (1.6 m)    Weight:  53.5 kg (117 lb 15.1 oz)    SpO2: 99%  100% 99%    Wt Readings from Last 3 Encounters:  04/06/15 53.5 kg (117 lb 15.1 oz)  03/27/15 54.795 kg (120 lb 12.8 oz)  09/29/14 54.432 kg (120 lb)     Intake/Output Summary (Last 24 hours) at 04/07/15 1133 Last data filed at 04/07/15 0900  Gross per 24 hour  Intake    100 ml  Output      0 ml  Net    100 ml     Physical Exam  Awake, pleasantly confused  Oriented X 1, No new F.N deficits, Normal affect [place].AT,PERRAL Supple Neck,No JVD, No cervical lymphadenopathy appriciated.  Symmetrical Chest wall movement, Good air movement bilaterally, CTAB RRR,No Gallops,Rubs or new Murmurs, No Parasternal Heave +ve B.Sounds, Abd Soft, No tenderness, No organomegaly appriciated, No rebound - guarding or rigidity. No Cyanosis, Clubbing or edema, No new Rash or bruise       Data Review:   Micro Results No results found for this or any previous visit (from the past 240 hour(s)).  Radiology Reports Ct Head Wo Contrast  04/06/2015  CLINICAL DATA:  Altered mental status, difficulty walking EXAM: CT HEAD WITHOUT CONTRAST TECHNIQUE: Contiguous axial images were obtained from the base of the skull through the vertex without intravenous contrast. COMPARISON:  12/01/2014 FINDINGS: Motion degraded images. No evidence of parenchymal hemorrhage or extra-axial fluid collection. No mass lesion, mass effect, or midline shift. No CT evidence of acute infarction.  Subcortical white matter and periventricular small vessel ischemic changes. Intracranial atherosclerosis. Global cortical and central atrophy.  No ventriculomegaly. The visualized paranasal sinuses are essentially clear. The mastoid air cells are unopacified. No evidence of calvarial fracture. IMPRESSION: No evidence of acute intracranial abnormality. Atrophy with small vessel ischemic changes. Electronically Signed   By: Charline Bills M.D.   On: 04/06/2015 17:32   Dg Abd Acute W/chest  04/06/2015  CLINICAL DATA:  Altered mental status. EXAM: DG ABDOMEN ACUTE W/ 1V CHEST COMPARISON:  CT of the abdomen and pelvis 08/24/2014. FINDINGS: The heart size is normal. Atherosclerotic changes are present at the aortic arch. Lungs are clear. The bowel gas pattern is normal. There is no free air or obstruction. Multilevel degenerative changes are noted throughout the thoracic spine. Pacing wires are stable. IMPRESSION: 1. No acute cardiopulmonary disease. 2. Normal bowel gas pattern. 3. Atherosclerosis. Electronically Signed   By: Marin Roberts M.D.   On: 04/06/2015 17:48     CBC  Recent Labs Lab 04/06/15 1700 04/06/15 2355 04/07/15 0552  WBC 5.4 5.4 5.7  HGB 11.4* 10.7* 11.4*  HCT 35.9* 34.2* 35.8*  PLT 151 145* 139*  MCV 96.2 96.6 95.7  MCH 30.6 30.2 30.5  MCHC 31.8 31.3 31.8  RDW 14.9 14.9 15.1  LYMPHSABS 1.9  --   --   MONOABS 0.5  --   --   EOSABS 0.2  --   --   BASOSABS 0.0  --   --     Chemistries   Recent Labs Lab 04/06/15 1700 04/06/15 2355 04/07/15 0552  NA 140  --  141  K 4.3  --  3.7  CL 105  --  106  CO2 29  --  28  GLUCOSE 218*  --  89  BUN 32*  --  25*  CREATININE 1.02* 0.91 0.70  CALCIUM 9.0  --  8.7*  MG  --  1.8  --   AST 24  --  18  ALT 17  --  14  ALKPHOS 74  --  70  BILITOT 0.5  --  0.5   ------------------------------------------------------------------------------------------------------------------ estimated creatinine clearance is 37.9 mL/min  (by C-G formula based on Cr of 0.7). ------------------------------------------------------------------------------------------------------------------ No results for input(s): HGBA1C in the last 72 hours. ------------------------------------------------------------------------------------------------------------------ No results for input(s): CHOL, HDL, LDLCALC, TRIG, CHOLHDL, LDLDIRECT in the last 72 hours. ------------------------------------------------------------------------------------------------------------------ No results for input(s): TSH, T4TOTAL, T3FREE, THYROIDAB in the last 72 hours.  Invalid input(s): FREET3 ------------------------------------------------------------------------------------------------------------------  Recent Labs  04/06/15 2355  VITAMINB12 373  FOLATE 11.5  FERRITIN 303  TIBC 181*  IRON 33    Coagulation profile  Recent Labs Lab 04/06/15 1700  INR 1.06    No results for input(s): DDIMER in the last 72 hours.  Cardiac Enzymes  Recent Labs Lab 04/06/15 1700 04/06/15 2355 04/07/15 0552  TROPONINI <0.03 <0.03 <0.03   ------------------------------------------------------------------------------------------------------------------ Invalid input(s): POCBNP   Time Spent in minutes  35   SINGH,PRASHANT K M.D on 04/07/2015 at 11:33 AM  Between 7am to 7pm - Pager - [phone]  After 7pm go to [email or web address] - password [facility]  [facility] -  Office  [phone]

## 2015-04-07 NOTE — Progress Notes (Addendum)
NURSING PROGRESS NOTE  Sherri RadMable Cropper [id number] Admission Data: 04/07/2015 1:36 AM Attending Provider: Bobette Moavid Manuel Ortiz, MD WUJ:[id number]:EUBANKS, JESSICA K, NP Code Status: Full  Allergies:  Ativan and Benzodiazepines Past Medical History:   has a past medical history of Dementia; Pacemaker; and OA (osteoarthritis). Past Surgical History:   has past surgical history that includes Knee Arthroplasty (Bilateral); Carpal tunnel release (Bilateral); Brain surgery (2013); Atrial cardiac pacemaker insertion (2012); and Flexible sigmoidoscopy (N/A, 09/12/2014). Social History:   reports that she has never smoked. She has never used smokeless tobacco. She reports that she does not drink alcohol or use illicit drugs.  Sherri Leonard is a 81 y.o. female patient admitted from ED:   Last Documented Vital Signs: Blood pressure 135/59, pulse 67, temperature 97.7 F (36.5 C), temperature source Oral, resp. rate 20, height 5\' 3"  (1.6 m), weight 53.5 kg (117 lb 15.1 oz), SpO2 100 %.  Cardiac Monitoring: Box # 10 in place. Cardiac monitor yields:normal sinus rhythm.  IV Fluids:  IV in place, occlusive dsg intact without redness, IV cath  .   Skin: moisture associated redness on groin area  Patient orientated to room. Information packet given to patient. Admission inpatient armband information verified with patient to include name and date of birth and placed on patient arm. Side rails up x 2, fall assessment and education completed with patient. Patient able to verbalize understanding of risk associated with falls and verbalized understanding to call for assistance before getting out of bed, however is confused. Call light within reach.  Patient's granddaughter was attempted to be called for updates.   Will continue to evaluate and treat per MD orders.  Sue LushKaelin Romesberg RN, BSN

## 2015-04-07 NOTE — Consult Note (Signed)
NEURO HOSPITALIST CONSULT NOTE   Requestig physician: Dr. Thedore Mins   Reason for Consult: abnormal arm movements, evaluate for possible seizure.   HPI:                                                                                                                                          Sherri Leonard is an 81 y.o. female who has dementia and all of history obtained from chart. Per medical records and patient was released from an assisted living facility 16 days ago and has been living at home since then. Relatives state that the patient has apparently rolled her eyes back in her head, was difficult to arouse and started talking in sentences that wouldn't make any sense. Relatives stated that she went back to her normal mental status and then did a second episode while the patient was soft from passenger in a relative's vehicle. Apparently the patient started to be restless, started talking in sentences that did not make much sense and try to reach for the stick shift of the vehicle.   Currently patient is in her bed, resting comfortably with right arm showing intermittent tremor of forearm and shoulder.  This movement is significantly decreased when I place my hand on her arm. She is able to follow all my commands without difficulty.   Past Medical History  Diagnosis Date  . Dementia   . Pacemaker   . OA (osteoarthritis)     Past Surgical History  Procedure Laterality Date  . Knee arthroplasty Bilateral   . Carpal tunnel release Bilateral   . Brain surgery  2013  . Atrial cardiac pacemaker insertion  2012  . Flexible sigmoidoscopy N/A 09/12/2014    Procedure: FLEXIBLE SIGMOIDOSCOPY;  Surgeon: Beverley Fiedler, MD;  Location: [facility];  Service: Gastroenterology;  Laterality: N/A;    Family History  Problem Relation Age of Onset  . Heart disease Son   . Cancer Son 60    on Liver  . Colon polyps Neg Hx   . Diabetes Neg Hx   . Kidney disease Neg Hx   . Heart  attack Son   . Alzheimer's disease Mother   . Alzheimer's disease Father      Social History:  reports that she has never smoked. She has never used smokeless tobacco. She reports that she does not drink alcohol or use illicit drugs.  Allergies  Allergen Reactions  . Ativan [Lorazepam] Other (See Comments)    Causes violence   . Benzodiazepines     MEDICATIONS:  Prior to Admission:  Prescriptions prior to admission  Medication Sig Dispense Refill Last Dose  . cholecalciferol (VITAMIN D) 1000 UNITS tablet Take 1 tablet (1,000 Units total) by mouth daily. 30 tablet 3 Taking  . memantine (NAMENDA XR) 28 MG CP24 24 hr capsule Take 1 capsule (28 mg total) by mouth daily. 30 capsule 3 Taking  . mirtazapine (REMERON) 7.5 MG tablet Take 1 tablet (7.5 mg total) by mouth at bedtime. 30 tablet 3 Taking  . sertraline (ZOLOFT) 25 MG tablet Take 1 tablet (25 mg total) by mouth daily. 30 tablet 3 Taking   Scheduled: . aspirin  81 mg Oral Daily  . cefTRIAXone (ROCEPHIN)  IV  1 g Intravenous Q0600  . cholecalciferol  1,000 Units Oral Daily  . [START ON 04/08/2015] enoxaparin (LOVENOX) injection  40 mg Subcutaneous Q24H  . memantine  28 mg Oral Daily  . mirtazapine  7.5 mg Oral QHS  . sertraline  25 mg Oral Daily  . sodium chloride  3 mL Intravenous Q12H     ROS:                                                                                                                                       History obtained from unobtainable from patient due to mental status  Blood pressure 153/61, pulse 60, temperature 98.1 F (36.7 C), temperature source Oral, resp. rate 13, height  (1.6 m), weight 53.5 kg (117 lb 15.1 oz), SpO2 99 %.   Neurologic Examination:                                                                                                      HEENT-  Normocephalic,  no lesions, without obvious abnormality.  Normal external eye and conjunctiva.  Normal TM's bilaterally.  Normal auditory canals and external ears. Normal external nose. Cardiovascular- S1, S2 normal, pulses palpable throughout   Lungs- chest clear, no wheezing, rales, normal symmetric air entry Abdomen- normal findings: bowel sounds normal Extremities- no edema Lymph-no adenopathy palpable Musculoskeletal-no joint tenderness, deformity or swelling Skin-warm and dry, no hyperpigmentation, vitiligo, or suspicious lesions  Neurological Examination Mental Status: Alert, not oriented.  Speech fluent without evidence of aphasia.  Able to follow 3 step commands with visual commands.  Cranial Nerves: II: ; Visual fields grossly normal, pupils equal, round, reactive to light and accommodation III,IV, VI: ptosis not present, extra-ocular motions intact bilaterally V,VII: smile symmetric, facial light touch sensation normal bilaterally VIII: hearing normal  bilaterally IX,X: uvula rises symmetrically XI: bilateral shoulder shrug XII: midline tongue extension Motor: Right : Upper extremity   5/5    Left:     Upper extremity   5/5  Lower extremity   5/5     Lower extremity   5/5 --right arm has tremor which is decreased when I place my hand over the arm Tone and bulk:normal tone throughout; no atrophy noted Sensory: Pinprick and light touch intact throughout, bilaterally Deep Tendon Reflexes: 1+ and symmetric throughout Plantars: Right: downgoing   Left: downgoing Cerebellar: normal finger-to-nose, Gait:  Deferred      Lab Results: Basic Metabolic Panel:  Recent Labs Lab 04/06/15 1700 04/06/15 2355 04/07/15 0552  NA 140  --  141  K 4.3  --  3.7  CL 105  --  106  CO2 29  --  28  GLUCOSE 218*  --  89  BUN 32*  --  25*  CREATININE 1.02* 0.91 0.70  CALCIUM 9.0  --  8.7*  MG  --  1.8  --   PHOS  --  3.2  --     Liver Function Tests:  Recent Labs Lab 04/06/15 1700  04/07/15 0552  AST 24 18  ALT 17 14  ALKPHOS 74 70  BILITOT 0.5 0.5  PROT 7.1 6.8  ALBUMIN 3.5 3.1*   No results for input(s): LIPASE, AMYLASE in the last 168 hours.  Recent Labs Lab 04/06/15 1745  AMMONIA 24    CBC:  Recent Labs Lab 04/06/15 1700 04/06/15 2355 04/07/15 0552  WBC 5.4 5.4 5.7  NEUTROABS 2.8  --   --   HGB 11.4* 10.7* 11.4*  HCT 35.9* 34.2* 35.8*  MCV 96.2 96.6 95.7  PLT 151 145* 139*    Cardiac Enzymes:  Recent Labs Lab 04/06/15 1700 04/06/15 2355 04/07/15 0552  TROPONINI <0.03 <0.03 <0.03    Lipid Panel: No results for input(s): CHOL, TRIG, HDL, CHOLHDL, VLDL, LDLCALC in the last 168 hours.  CBG:  Recent Labs Lab 04/06/15 1654 04/07/15 0806 04/07/15 1221  GLUCAP 214* 91 93    Microbiology: No results found for this or any previous visit.  Coagulation Studies:  Recent Labs  04/06/15 1700  LABPROT 14.0  INR 1.06    Imaging: Ct Head Wo Contrast  04/06/2015  CLINICAL DATA:  Altered mental status, difficulty walking EXAM: CT HEAD WITHOUT CONTRAST TECHNIQUE: Contiguous axial images were obtained from the base of the skull through the vertex without intravenous contrast. COMPARISON:  12/01/2014 FINDINGS: Motion degraded images. No evidence of parenchymal hemorrhage or extra-axial fluid collection. No mass lesion, mass effect, or midline shift. No CT evidence of acute infarction. Subcortical white matter and periventricular small vessel ischemic changes. Intracranial atherosclerosis. Global cortical and central atrophy.  No ventriculomegaly. The visualized paranasal sinuses are essentially clear. The mastoid air cells are unopacified. No evidence of calvarial fracture. IMPRESSION: No evidence of acute intracranial abnormality. Atrophy with small vessel ischemic changes. Electronically Signed   By: Charline BillsSriyesh  Krishnan M.D.   On: 04/06/2015 17:32   Dg Abd Acute W/chest  04/06/2015  CLINICAL DATA:  Altered mental status. EXAM: DG ABDOMEN  ACUTE W/ 1V CHEST COMPARISON:  CT of the abdomen and pelvis 08/24/2014. FINDINGS: The heart size is normal. Atherosclerotic changes are present at the aortic arch. Lungs are clear. The bowel gas pattern is normal. There is no free air or obstruction. Multilevel degenerative changes are noted throughout the thoracic spine. Pacing wires are stable. IMPRESSION: 1. No  acute cardiopulmonary disease. 2. Normal bowel gas pattern. 3. Atherosclerosis. Electronically Signed   By: Marin Roberts M.D.   On: 04/06/2015 17:48       Assessment and plan per attending neurologist  Felicie Morn PA-C [facility] [phone]  04/07/2015, 12:54 PM   Assessment/Plan:  81 YO female with dementia, presenting to hospital secondary to abnormal arm movements associated with abnormal speech.  CT of the head did not reveal any acute pathology.   Possible encephalopathy. Recommend metabolic workup. We will obtain the EEG to evaluate for severity of encephalopathy and to rule out underlying  epileptiform disorder.   Patient evaluated with physician assistant, Felicie Morn. Helped him formulate the plan. Agree with the documented assessment.

## 2015-04-07 NOTE — Clinical Social Work Placement (Signed)
   CLINICAL SOCIAL WORK PLACEMENT  NOTE  Date:  04/07/2015  Patient Details  Name: Sherri RadMable Adcox MRN: [id number] Date of Birth: 12/25/1920  Clinical Social Work is seeking post-discharge placement for this patient at the Skilled  Nursing Facility level of care (*CSW will initial, date and re-position this form in  chart as items are completed):  Yes   Patient/family provided with [place] Clinical Social Work Department's list of facilities offering this level of care within the geographic area requested by the patient (or if unable, by the patient's family).  Yes   Patient/family informed of their freedom to choose among providers that offer the needed level of care, that participate in Medicare, Medicaid or managed care program needed by the patient, have an available bed and are willing to accept the patient.  Yes   Patient/family informed of '[facility] ownership interest [facility] and [facility], as well as of the fact that they are under no obligation to receive care at these facilities.  PASRR submitted to EDS on 04/07/15     PASRR number received on 04/07/15     Existing PASRR number confirmed on       FL2 transmitted to all facilities in geographic area requested by pt/family on 04/07/15     FL2 transmitted to all facilities within larger geographic area on       Patient informed that his/her managed care company has contracts with or will negotiate with certain facilities, including the following:            Patient/family informed of bed offers received.  Patient chooses bed at       Physician recommends and patient chooses bed at      Patient to be transferred to   on  .  Patient to be transferred to facility by       Patient family notified on   of transfer.  Name of family member notified:        PHYSICIAN Please sign FL2     Additional Comment:    _______________________________________________ Izora RibasHoloman, Shayma Pfefferle M,  LCSW 04/07/2015, 5:10 PM

## 2015-04-07 NOTE — Progress Notes (Signed)
EEG Completed; Results Pending  

## 2015-04-07 NOTE — Procedures (Signed)
ELECTROENCEPHALOGRAM REPORT  Date of Study: 04/07/2015  Patient's Name: Sherri Leonard MRN: [id number] Date of Birth: January 07, 1921  Referring Provider: Dr. Susa RaringPrashant Singh  Clinical History: This is a 81 year old woman with waxing and waning mental status.   Medications: cefTRIAXone (ROCEPHIN) 1 g in dextrose 5 % 50 mL IVPB cholecalciferol (VITAMIN D) tablet 1,000 Units memantine (NAMENDA XR) 24 hr capsule 28 mg mirtazapine (REMERON) tablet 7.5 mg sertraline (ZOLOFT) tablet 25 mg  Technical Summary: A multichannel digital EEG recording measured by the international 10-20 system with electrodes applied with paste and impedances below 5000 ohms performed as portable with EKG monitoring in an awake and asleep patient.  Hyperventilation and photic stimulation were not performed.  The digital EEG was referentially recorded, reformatted, and digitally filtered in a variety of bipolar and referential montages for optimal display.   Description: The patient is awake and asleep during the recording. She is noted to be confused.  There is no clear posterior dominant rhythm. The background consists of a large amount of diffuse 4-5 Hz theta slowing. During drowsiness and sleep, there is an increase in theta and delta slowing of the background with central beta activity seen.  Normal sleep architecture is not seen. Hyperventilation and photic stimulation were not performed.  There were no epileptiform discharges or electrographic seizures seen.    EKG lead showed sinus bradycardia at 60 bpm..  Impression: This awake and asleep EEG is abnormal due to mild to moderate diffuse slowing of the waking background.  Clinical Correlation of the above findings indicates diffuse cerebral dysfunction that is non-specific in etiology and can be seen with hypoxic/ischemic injury, toxic/metabolic encephalopathies, neurodegenerative disorders, or medication effect.  The absence of epileptiform discharges does not  rule out a clinical diagnosis of epilepsy.  Clinical correlation is advised.   Patrcia DollyKaren Aquino, M.D.

## 2015-04-08 DIAGNOSIS — R739 Hyperglycemia, unspecified: Secondary | ICD-10-CM

## 2015-04-08 LAB — GLUCOSE, CAPILLARY
GLUCOSE-CAPILLARY: 90 mg/dL (ref 65–99)
GLUCOSE-CAPILLARY: 124 mg/dL — AB (ref 65–99)
Glucose-Capillary: 103 mg/dL — ABNORMAL HIGH (ref 65–99)
Glucose-Capillary: 159 mg/dL — ABNORMAL HIGH (ref 65–99)

## 2015-04-08 LAB — HEMOGLOBIN A1C
Hgb A1c MFr Bld: 6.1 % — ABNORMAL HIGH (ref 4.8–5.6)
Mean Plasma Glucose: 128 mg/dL

## 2015-04-08 MED ORDER — CEFPODOXIME PROXETIL 200 MG PO TABS: 200.0000 mg | ORAL_TABLET | Freq: Two times a day (BID) | ORAL | Status: DC

## 2015-04-08 MED ORDER — AMANTADINE HCL 100 MG PO CAPS: 100.0000 mg | ORAL_CAPSULE | Freq: Every day | ORAL | Status: DC

## 2015-04-08 MED ORDER — AMANTADINE HCL 100 MG PO CAPS
100.0000 mg | ORAL_CAPSULE | Freq: Two times a day (BID) | ORAL | Status: DC
  Administered 2015-04-08 – 2015-04-09 (×3): 100 mg via ORAL
  Filled 2015-04-08 (×4): qty 1

## 2015-04-08 NOTE — Social Work (Signed)
CSW reviewed original bed offers. Facilities who made offers clarified that bed offers are not currently available. CSW will re-evaluate plan for discharge 04/09/15.  Beverly Sessionsywan J Durante Violett MSW, LCSW [phone]

## 2015-04-08 NOTE — Discharge Instructions (Signed)
Follow with Primary MD Janyth ContesEUBANKS, JESSICA K, NP in 7 days   Get CBC, CMP, 2 view Chest X ray checked  by Primary MD next visit.    Activity: As tolerated with Full fall precautions use walker/cane & assistance as needed   Disposition SNF   Diet: Heart Healthy   with feeding assistance and aspiration precautions.  For Heart failure patients - Check your Weight same time everyday, if you gain over 2 pounds, or you develop in leg swelling, experience more shortness of breath or chest pain, call your Primary MD immediately. Follow Cardiac Low Salt Diet and 1.5 lit/day fluid restriction.   On your next visit with your primary care physician please Get Medicines reviewed and adjusted.   Please request your Prim.MD to go over all Hospital Tests and Procedure/Radiological results at the follow up, please get all Hospital records sent to your Prim MD by signing hospital release before you go home.   If you experience worsening of your admission symptoms, develop shortness of breath, life threatening emergency, suicidal or homicidal thoughts you must seek medical attention immediately by calling 911 or calling your MD immediately  if symptoms less severe.  You Must read complete instructions/literature along with all the possible adverse reactions/side effects for all the Medicines you take and that have been prescribed to you. Take any new Medicines after you have completely understood and accpet all the possible adverse reactions/side effects.   Do not drive, operating heavy machinery, perform activities at heights, swimming or participation in water activities or provide baby sitting services if your were admitted for syncope or siezures until you have seen by Primary MD or a Neurologist and advised to do so again.  Do not drive when taking Pain medications.    Do not take more than prescribed Pain, Sleep and Anxiety Medications  Special Instructions: If you have smoked or chewed Tobacco  in  the last 2 yrs please stop smoking, stop any regular Alcohol  and or any Recreational drug use.  Wear Seat belts while driving.   Please note  You were cared for by a hospitalist during your hospital stay. If you have any questions about your discharge medications or the care you received while you were in the hospital after you are discharged, you can call the unit and asked to speak with the hospitalist on call if the hospitalist that took care of you is not available. Once you are discharged, your primary care physician will handle any further medical issues. Please note that NO REFILLS for any discharge medications will be authorized once you are discharged, as it is imperative that you return to your primary care physician (or establish a relationship with a primary care physician if you do not have one) for your aftercare needs so that they can reassess your need for medications and monitor your lab values.

## 2015-04-08 NOTE — Discharge Summary (Addendum)
Sherri Leonard, is a 81 y.o. female  DOB November 11, 1920  MRN [id number].  Admission date:  04/06/2015  Admitting Physician  Bobette Mo, MD  Discharge Date:  04/09/2015   Primary MD  Sharon Seller, NP   Patient remains discharged as per discharge summary done yesterday. Remains stable no changes. No subjective complaints this morning. Vital signs are stable. Await placement by social work.    Recommendations for primary care physician for things to follow:   Check CBC, BMP in a week. Outpatient neurology follow-up within 1-2 weeks.   Admission Diagnosis  Altered mental status [R41.82]   Discharge Diagnosis  Altered mental status [R41.82]    Principal Problem:   Altered mental status Active Problems:   Dementia with behavioral disturbance   Anemia   Hyperglycemia   Lactic acidosis      Past Medical History  Diagnosis Date  . Dementia   . Pacemaker   . OA (osteoarthritis)     Past Surgical History  Procedure Laterality Date  . Knee arthroplasty Bilateral   . Carpal tunnel release Bilateral   . Brain surgery  2013  . Atrial cardiac pacemaker insertion  2012  . Flexible sigmoidoscopy N/A 09/12/2014    Procedure: FLEXIBLE SIGMOIDOSCOPY;  Surgeon: Beverley Fiedler, MD;  Location: [facility];  Service: Gastroenterology;  Laterality: N/A;       HPI  from the history and physical done on the day of admission:    Sherri Leonard is a 81 y.o. female with a past medical history of Alzheimer's dementia who was brought to [facility] due to waxing and waning altered mental status during the afternoon. Per medical records and patient was released from an assisted living facility 16 days ago and has been living at home since then. Relatives state that the patient has apparently rolled her eyes back in  her head, was difficult to arouse and started talking in sentences that wouldn't make any sense. Relatives stated that she went back to her normal mental status and then did a second episode while the patient was soft from passenger in a relative's vehicle. Apparently the patient started to be restless, started talking in sentences that did not make much sense and try to reach for the stick shift of the vehicle. Since then the patient was unable to walk. Family members stated that usually the patient is able to assist herself in the dressing, able to walk and do simple task, but has been unable to do so since the second instance of symptoms . Workup is significant for lactic acidosis. When seen, the patient was awake alert oriented 1 and was in no acute distress.     Hospital Course:    1. Encephalopathy. Rule out seizure, head CT stable, has underlying dementia but symptoms were out of character. EEG did not show any seizure-like activity. No focal deficits on exam, no headache, seen by neurology and they added amantadine to her regimen for possible underlying undiagnosed Parkinson's, she is currently back to her baseline  and will be discharged to SNF with outpatient neurology follow-up.   Discussed her care with granddaughter over the phone. She wants to continue gentle medical treatment, for now they do not want to consider palliative care etc. Currently consider making her DO NOT RESUSCITATE after further discussions. Gentle transition to comfort care might be appropriate at her age.   2. Questionable UTI with borderline UA. Culture is negative so far, given Rocephin 2 doses , will give her Vantin 3 more days.   3. Lactic acidosis. Likely due to dehydration. Resolved after IV fluids.   4. Underlying dementia. At risk for delirium, home medications continued. Minimize narcotics and benzodiazepine use.        Discharge Condition: Fair  Follow UP  Follow-up Information    Follow up  with Janyth Contes, JESSICA K, NP. Schedule an appointment as soon as possible for a visit in 1 week.   Specialty:  Geriatric Medicine   Contact information:   [address] [place] [address] [phone]       Follow up with [facility]. Schedule an appointment as soon as possible for a visit in 1 week.   Contact information:   [address]     [address] [place] [address] [phone]      Follow up with [facility] .   Specialty:  Skilled Nursing Facility   Contact information:   [address] [place] [address] [phone]       Consults obtained - Neurology  Diet and Activity recommendation: See Discharge Instructions below  Discharge Instructions           Discharge Instructions    Discharge instructions    Complete by:  As directed   Follow with Primary MD Janyth Contes, JESSICA K, NP in 7 days   Get CBC, CMP, 2 view Chest X ray checked  by Primary MD next visit.    Activity: As tolerated with Full fall precautions use walker/cane & assistance as needed   Disposition SNF   Diet: Heart Healthy   with feeding assistance and aspiration precautions.  For Heart failure patients - Check your Weight same time everyday, if you gain over 2 pounds, or you develop in leg swelling, experience more shortness of breath or chest pain, call your Primary MD immediately. Follow Cardiac Low Salt Diet and 1.5 lit/day fluid restriction.   On your next visit with your primary care physician please Get Medicines reviewed and adjusted.   Please request your Prim.MD to go over all Hospital Tests and Procedure/Radiological results at the follow up, please get all Hospital records sent to your Prim MD by signing hospital release before you go home.   If you experience worsening of your admission symptoms, develop shortness of breath, life threatening emergency, suicidal or homicidal thoughts you must seek medical  attention immediately by calling 911 or calling your MD immediately  if symptoms less severe.  You Must read complete instructions/literature along with all the possible adverse reactions/side effects for all the Medicines you take and that have been prescribed to you. Take any new Medicines after you have completely understood and accpet all the possible adverse reactions/side effects.   Do not drive, operating heavy machinery, perform activities at heights, swimming or participation in water activities or provide baby sitting services if your were admitted for syncope or siezures until you have seen by Primary MD or a Neurologist and advised to do so again.  Do not drive  when taking Pain medications.    Do not take more than prescribed Pain, Sleep and Anxiety Medications  Special Instructions: If you have smoked or chewed Tobacco  in the last 2 yrs please stop smoking, stop any regular Alcohol  and or any Recreational drug use.  Wear Seat belts while driving.   Please note  You were cared for by a hospitalist during your hospital stay. If you have any questions about your discharge medications or the care you received while you were in the hospital after you are discharged, you can call the unit and asked to speak with the hospitalist on call if the hospitalist that took care of you is not available. Once you are discharged, your primary care physician will handle any further medical issues. Please note that NO REFILLS for any discharge medications will be authorized once you are discharged, as it is imperative that you return to your primary care physician (or establish a relationship with a primary care physician if you do not have one) for your aftercare needs so that they can reassess your need for medications and monitor your lab values.     Increase activity slowly    Complete by:  As directed              Discharge Medications       Medication List    STOP taking these  medications        carbamazepine 100 MG chewable tablet  Commonly known as:  TEGRETOL     Vitamin D (Ergocalciferol) 50000 UNITS Caps capsule  Commonly known as:  DRISDOL      TAKE these medications        amantadine 100 MG capsule  Commonly known as:  SYMMETREL  Take 1 capsule (100 mg total) by mouth daily.     cefpodoxime 200 MG tablet  Commonly known as:  VANTIN  Take 1 tablet (200 mg total) by mouth 2 (two) times daily. For 3 more days     cholecalciferol 1000 UNITS tablet  Commonly known as:  VITAMIN D  Take 1 tablet (1,000 Units total) by mouth daily.     memantine 28 MG Cp24 24 hr capsule  Commonly known as:  NAMENDA XR  Take 1 capsule (28 mg total) by mouth daily.     mirtazapine 7.5 MG tablet  Commonly known as:  REMERON  Take 1 tablet (7.5 mg total) by mouth at bedtime.     sertraline 25 MG tablet  Commonly known as:  ZOLOFT  Take 1 tablet (25 mg total) by mouth daily.        Major procedures and Radiology Reports - PLEASE review detailed and final reports for all details, in brief -    EEG  No seizure-like activity.   Ct Head Wo Contrast  04/06/2015  CLINICAL DATA:  Altered mental status, difficulty walking EXAM: CT HEAD WITHOUT CONTRAST TECHNIQUE: Contiguous axial images were obtained from the base of the skull through the vertex without intravenous contrast. COMPARISON:  12/01/2014 FINDINGS: Motion degraded images. No evidence of parenchymal hemorrhage or extra-axial fluid collection. No mass lesion, mass effect, or midline shift. No CT evidence of acute infarction. Subcortical white matter and periventricular small vessel ischemic changes. Intracranial atherosclerosis. Global cortical and central atrophy.  No ventriculomegaly. The visualized paranasal sinuses are essentially clear. The mastoid air cells are unopacified. No evidence of calvarial fracture. IMPRESSION: No evidence of acute intracranial abnormality. Atrophy with small vessel ischemic changes.  Electronically Signed  By: Sriyesh  Krishnan M.D.   On: 04/06/2015 17:32 Charline Bills  Dg Abd Acute W/chest  04/06/2015  CLINICAL DATA:  Altered mental status. EXAM: DG ABDOMEN ACUTE W/ 1V CHEST COMPARISON:  CT of the abdomen and pelvis 08/24/2014. FINDINGS: The heart size is normal. Atherosclerotic changes are present at the aortic arch. Lungs are clear. The bowel gas pattern is normal. There is no free air or obstruction. Multilevel degenerative changes are noted throughout the thoracic spine. Pacing wires are stable. IMPRESSION: 1. No acute cardiopulmonary disease. 2. Normal bowel gas pattern. 3. Atherosclerosis. Electronically Signed   By: Marin Robertshristopher  Mattern M.D.   On: 04/06/2015 17:48    Micro Results      Recent Results (from the past 240 hour(s))  Culture, Urine     Status: None (Preliminary result)   Collection Time: 04/07/15  4:43 PM  Result Value Ref Range Status   Specimen Description URINE, RANDOM  Final   Special Requests NONE  Final   Culture TOO YOUNG TO READ  Final   Report Status PENDING  Incomplete       Today   Subjective    Sherri Leonard today has no headache,no chest abdominal pain,no new weakness tingling or numbness, feels much better   Objective   Blood pressure 159/88, pulse 87, temperature 97.6 F (36.4 C), temperature source Oral, resp. rate 16, height 5\' 3"  (1.6 m), weight 53.5 kg (117 lb 15.1 oz), SpO2 100 %.   Intake/Output Summary (Last 24 hours) at 04/09/15 0915 Last data filed at 04/09/15 0835  Gross per 24 hour  Intake   1108 ml  Output      0 ml  Net   1108 ml    Exam Awake , pleasantly confused, No new F.N deficits,   [place].AT,PERRAL Supple Neck,No JVD, No cervical lymphadenopathy appriciated.  Symmetrical Chest wall movement, Good air movement bilaterally, CTAB RRR,No Gallops,Rubs or new Murmurs, No Parasternal Heave +ve B.Sounds, Abd Soft, Non tender, No organomegaly appriciated, No rebound -guarding or rigidity. No Cyanosis, Clubbing  or edema, No new Rash or bruise   Data Review   CBC w Diff:  Lab Results  Component Value Date   WBC 5.7 04/07/2015   WBC 4.4 03/27/2015   HGB 11.4* 04/07/2015   HCT 35.8* 04/07/2015   HCT 33.0* 03/27/2015   PLT 139* 04/07/2015   LYMPHOPCT 35 04/06/2015   MONOPCT 9 04/06/2015   EOSPCT 3 04/06/2015   BASOPCT 0 04/06/2015    CMP:  Lab Results  Component Value Date   NA 141 04/07/2015   NA 142 03/27/2015   K 3.7 04/07/2015   CL 106 04/07/2015   CO2 28 04/07/2015   BUN 25* 04/07/2015   BUN 25 03/27/2015   CREATININE 0.70 04/07/2015   PROT 6.8 04/07/2015   PROT 7.1 03/27/2015   ALBUMIN 3.1* 04/07/2015   ALBUMIN 3.9 03/27/2015   BILITOT 0.5 04/07/2015   BILITOT <0.2 03/27/2015   ALKPHOS 70 04/07/2015   AST 18 04/07/2015   ALT 14 04/07/2015  .   Total Time in preparing paper work, data evaluation and todays exam - 35 minutes  Leroy SeaSINGH,Gillis Boardley K M.D on 04/09/2015 at 9:15 AM  [facility]   Office  [phone]

## 2015-04-08 NOTE — Social Work (Signed)
CSW spoke with [place] daughter Rhae Hammockrmina. CSW shared with [place] bed offers that may be able to accept patients over the weekend. Those facilities are [facility], [facility], and [facility]. Rhae Hammockrmina will research and contact CSW.  Beverly Sessionsywan J Tinnie Kunin MSW, LCSW [phone]

## 2015-04-08 NOTE — Evaluation (Signed)
Clinical/Bedside Swallow Evaluation Patient Details  Name: Sherri Leonard MRN: [id number] Date of Birth: 07/10/1920  Today's Date: 04/08/2015 Time: SLP Start Time (ACUTE ONLY): 0911 SLP Stop Time (ACUTE ONLY): 0924 SLP Time Calculation (min) (ACUTE ONLY): 13 min  Past Medical History:  Past Medical History  Diagnosis Date  . Dementia   . Pacemaker   . OA (osteoarthritis)    Past Surgical History:  Past Surgical History  Procedure Laterality Date  . Knee arthroplasty Bilateral   . Carpal tunnel release Bilateral   . Brain surgery  2013  . Atrial cardiac pacemaker insertion  2012  . Flexible sigmoidoscopy N/A 09/12/2014    Procedure: FLEXIBLE SIGMOIDOSCOPY;  Surgeon: Beverley FiedlerJay M Pyrtle, MD;  Location: [facility];  Service: Gastroenterology;  Laterality: N/A;   HPI:  Pt is a 81 y.o. female with history of dementia admitted with encephalopathy, lactic acidosis, and questionable UTI. CT of head negative for acute intracranial abnormality.    Assessment / Plan / Recommendation Clinical Impression  Pts swallow appearing WFL. No signs or symptoms of reduced airway protection with any PO. Pt and RN both deny difficutly with current diet. Recommend full supervision with PO given severity of cognitive deficits and difficulty observed with self feeding secondary to upper extremity tremors. No further swallow needs identified, ST to sign off.     Aspiration Risk  Mild aspiration risk    Diet Recommendation     Medication Administration: Whole meds with liquid    Other  Recommendations Oral Care Recommendations: Oral care BID   Follow up Recommendations       Frequency and Duration            Swallow Study   General Date of Onset: 04/06/15 HPI: Pt is a 81 y.o. female with history of dementia admitted with encephalopathy, lactic acidosis, and questionable UTI. CT of head negative for acute intracranial abnormality.  Type of Study: Bedside Swallow Evaluation Diet Prior to this  Study: Regular;Thin liquids Temperature Spikes Noted: No Respiratory Status: Room air History of Recent Intubation: No Behavior/Cognition: Alert;Confused;Cooperative Oral Cavity Assessment: Within Functional Limits Oral Care Completed by SLP: No Oral Cavity - Dentition: Adequate natural dentition Vision: Functional for self-feeding Self-Feeding Abilities: Needs assist (secondary to upper extremity tremors with self feeding) Patient Positioning: Upright in bed Baseline Vocal Quality: Normal Volitional Swallow: Able to elicit    Oral/Motor/Sensory Function Overall Oral Motor/Sensory Function: Within functional limits   Ice Chips Ice chips: Not tested   Thin Liquid Thin Liquid: Within functional limits Presentation: Cup;Straw    Nectar Thick Nectar Thick Liquid: Not tested   Honey Thick Honey Thick Liquid: Not tested   Puree Puree: Within functional limits   Solid Solid: Within functional limits      Marcene Duoshelsea Sumney MA, CCC-SLP Acute Care Speech Language Pathologist    Marcene DuosSumney, Aya Geisel E 04/08/2015,9:29 AM

## 2015-04-09 LAB — GLUCOSE, CAPILLARY
GLUCOSE-CAPILLARY: 100 mg/dL — AB (ref 65–99)
GLUCOSE-CAPILLARY: 126 mg/dL — AB (ref 65–99)
Glucose-Capillary: 103 mg/dL — ABNORMAL HIGH (ref 65–99)

## 2015-04-09 LAB — URINE CULTURE

## 2015-04-09 NOTE — Care Management Important Message (Signed)
Important Message  Patient Details  Name: Sherri Leonard MRN: [id number] Date of Birth: 10/05/1920   Medicare Important Message Given:  Yes    Sherri Leonard 04/09/2015, 8:41 AM

## 2015-04-09 NOTE — Progress Notes (Signed)
[facility] given to [facility]Kris [facility].

## 2015-04-10 ENCOUNTER — Non-Acute Institutional Stay (SKILLED_NURSING_FACILITY): Payer: Medicare Other | Admitting: Adult Health

## 2015-04-10 ENCOUNTER — Encounter: Payer: Self-pay | Admitting: Adult Health

## 2015-04-10 DIAGNOSIS — F329 Major depressive disorder, single episode, unspecified: Secondary | ICD-10-CM

## 2015-04-10 DIAGNOSIS — F0280 Dementia in other diseases classified elsewhere without behavioral disturbance: Secondary | ICD-10-CM | POA: Diagnosis not present

## 2015-04-10 DIAGNOSIS — N3000 Acute cystitis without hematuria: Secondary | ICD-10-CM | POA: Diagnosis not present

## 2015-04-10 DIAGNOSIS — F0393 Unspecified dementia, unspecified severity, with mood disturbance: Secondary | ICD-10-CM

## 2015-04-10 DIAGNOSIS — R400 Somnolence: Secondary | ICD-10-CM | POA: Diagnosis not present

## 2015-04-10 DIAGNOSIS — G301 Alzheimer's disease with late onset: Secondary | ICD-10-CM

## 2015-04-10 DIAGNOSIS — R259 Unspecified abnormal involuntary movements: Secondary | ICD-10-CM | POA: Insufficient documentation

## 2015-04-10 DIAGNOSIS — F0390 Unspecified dementia without behavioral disturbance: Secondary | ICD-10-CM | POA: Insufficient documentation

## 2015-04-10 DIAGNOSIS — N390 Urinary tract infection, site not specified: Secondary | ICD-10-CM | POA: Insufficient documentation

## 2015-04-10 NOTE — Progress Notes (Signed)
Patient ID: Sherri Leonard, female   DOB: 08-03-1920, 81 y.o.   MRN: [id number]    Facility: [facility]      Allergies  Allergen Reactions  . Ativan [Lorazepam] Other (See Comments)    Causes violence   . Benzodiazepines     Chief Complaint  Patient presents with  . Hospitalization Follow-up    HPI:  She had been discharged from alf to home approximately 2 weeks prior to her hospitalization. She had several unresponsive episodes. She was seen by neurology; had an EEG done. She was started on amantadine for possible underlying undiagnosed parkinson disease.  She is here for short term rehab. I am not certain if this will be long term placement or not.    Past Medical History  Diagnosis Date  . Dementia   . Pacemaker   . OA (osteoarthritis)     Past Surgical History  Procedure Laterality Date  . Knee arthroplasty Bilateral   . Carpal tunnel release Bilateral   . Brain surgery  2013  . Atrial cardiac pacemaker insertion  2012  . Flexible sigmoidoscopy N/A 09/12/2014    Procedure: FLEXIBLE SIGMOIDOSCOPY;  Surgeon: Beverley Fiedler, MD;  Location: [facility];  Service: Gastroenterology;  Laterality: N/A;    VITAL SIGNS BP 112/68 mmHg  Pulse 68  Ht  (1.6 m)  Wt 117 lb (53.071 kg)  BMI 20.73 kg/m2  SpO2 97%  Patient's Medications  New Prescriptions   No medications on file  Previous Medications   AMANTADINE (SYMMETREL) 100 MG CAPSULE    Take 1 capsule (100 mg total) by mouth daily.   CEFPODOXIME (VANTIN) 200 MG TABLET    Take 1 tablet (200 mg total) by mouth 2 (two) times daily. For 3 more days   CHOLECALCIFEROL (VITAMIN D) 1000 UNITS TABLET    Take 1 tablet (1,000 Units total) by mouth daily.   MEMANTINE (NAMENDA XR) 28 MG CP24 24 HR CAPSULE    Take 1 capsule (28 mg total) by mouth daily.   MIRTAZAPINE (REMERON) 7.5 MG TABLET    Take 1 tablet (7.5 mg total) by mouth at bedtime.   SERTRALINE (ZOLOFT) 25 MG TABLET    Take 1 tablet (25 mg total) by mouth daily.    Modified Medications   No medications on file  Discontinued Medications   No medications on file     SIGNIFICANT DIAGNOSTIC EXAMS  04-06-15: ct of head: No evidence of acute intracranial abnormality. Atrophy with small vessel ischemic changes  04-06-15: acute abdomen with chest x-ray: 1. No acute cardiopulmonary disease. 2. Normal bowel gas pattern. 3. Atherosclerosis.  04-07-15: EEG: Impression: This awake and asleep EEG is abnormal due to mild to moderate diffuse slowing of the waking background. Clinical Correlation of the above findings indicates diffuse cerebral dysfunction that is non-specific in etiology and can be seen with hypoxic/ischemic injury, toxic/metabolic encephalopathies, neurodegenerative disorders, or medication effect.  The absence of epileptiform discharges does not rule out a clinical diagnosis of epilepsy.  Clinical correlation is advised.   LABS REVIEWED:   03-27-15: tsh 1.740 04-06-15: wbc 5.4; hgb 11.4; hct 35.9; mcv 96.2; plt 151; glucose 218; bun 32; creaet 1.02; k+ 4.3; na++140; liver normal albumin 3.5; mag 1.8; phos 3.2; vit b12: 373; iron 33; tibc 181; ferritin 303; ammonia 24;  Folate 11.5 04-07-15: urine culture: multiple bacteria       Review of Systems  Constitutional: Negative for malaise/fatigue.  Respiratory: Negative for cough and shortness of breath.  Cardiovascular: Negative for chest pain and leg swelling.  Gastrointestinal: Negative for abdominal pain and constipation.  Musculoskeletal: Negative for myalgias and joint pain.  Skin: Negative.   Neurological: Negative for dizziness.  Psychiatric/Behavioral: The patient is not nervous/anxious.      Physical Exam  Constitutional: No distress.  Frail   Eyes: Conjunctivae are normal.  Neck: Neck supple. No JVD present. No thyromegaly present.  Cardiovascular: Normal rate, regular rhythm and intact distal pulses.   Respiratory: Effort normal and breath sounds normal. No respiratory  distress. She has no wheezes.  GI: Soft. Bowel sounds are normal. She exhibits no distension. There is no tenderness.  Musculoskeletal: She exhibits no edema.  Able to move all extremities   Lymphadenopathy:    She has no cervical adenopathy.  Neurological: She is alert.  Skin: Skin is warm and dry. She is not diaphoretic.  Psychiatric: She has a normal mood and affect.       ASSESSMENT/ PLAN:  1. Altered mental status: has resolved; will continue to monitor her status.   2. UTI: presumed; will complete vantin and will monitor her status.   3. Alzheimer's disease: no significant change in her status; will continue namenda xr 28 mg daily her current weight is 117 pounds  4. Possible parkinson's disease: will continue amantadine 100 mg daily will monitor her status and will follow up with neurology as ordered.   5. Depression: will continue zlooft 25 mg daily is taking remeron 7.5 mg nightly for rest and to help with appetite. Her current weight is 117 pounds.    Will check cbc; bmp in one week Will have her follow up with neurology in one week    Time spent with patient  50  minutes >50% time spent counseling; reviewing medical record; tests; labs; and developing future plan of care      Synthia Innocenteborah Annette Bertelson NP [facility]  Contact [phone] Monday through Friday 8am- 5pm  After hours call [phone]

## 2015-04-11 ENCOUNTER — Non-Acute Institutional Stay (SKILLED_NURSING_FACILITY): Payer: Medicare Other | Admitting: Internal Medicine

## 2015-04-11 DIAGNOSIS — R259 Unspecified abnormal involuntary movements: Secondary | ICD-10-CM

## 2015-04-11 DIAGNOSIS — Z950 Presence of cardiac pacemaker: Secondary | ICD-10-CM

## 2015-04-11 DIAGNOSIS — F332 Major depressive disorder, recurrent severe without psychotic features: Secondary | ICD-10-CM | POA: Diagnosis not present

## 2015-04-11 DIAGNOSIS — F0280 Dementia in other diseases classified elsewhere without behavioral disturbance: Secondary | ICD-10-CM | POA: Diagnosis not present

## 2015-04-11 DIAGNOSIS — M1990 Unspecified osteoarthritis, unspecified site: Secondary | ICD-10-CM | POA: Diagnosis not present

## 2015-04-11 DIAGNOSIS — G301 Alzheimer's disease with late onset: Secondary | ICD-10-CM | POA: Diagnosis not present

## 2015-05-03 ENCOUNTER — Ambulatory Visit (INDEPENDENT_AMBULATORY_CARE_PROVIDER_SITE_OTHER): Payer: Medicare Other | Admitting: Diagnostic Neuroimaging

## 2015-05-03 ENCOUNTER — Encounter: Payer: Self-pay | Admitting: Diagnostic Neuroimaging

## 2015-05-03 VITALS — BP 102/60

## 2015-05-03 DIAGNOSIS — F03C0 Unspecified dementia, severe, without behavioral disturbance, psychotic disturbance, mood disturbance, and anxiety: Secondary | ICD-10-CM

## 2015-05-03 DIAGNOSIS — I10 Essential (primary) hypertension: Secondary | ICD-10-CM | POA: Insufficient documentation

## 2015-05-03 DIAGNOSIS — R251 Tremor, unspecified: Secondary | ICD-10-CM | POA: Diagnosis not present

## 2015-05-03 DIAGNOSIS — F0390 Unspecified dementia without behavioral disturbance: Secondary | ICD-10-CM

## 2015-05-03 DIAGNOSIS — M1990 Unspecified osteoarthritis, unspecified site: Secondary | ICD-10-CM | POA: Insufficient documentation

## 2015-05-03 NOTE — Patient Instructions (Signed)
[facility] you for coming to see [facility] at [facility]. I hope we have been able to provide you high quality care today.  You may receive a patient satisfaction survey over the next few weeks. We would appreciate your feedback and comments so that we may continue to improve ourselves and the health of our patients.  - monitor symptoms - consider estate planning, living will, healthcare POA documents. Sometimes this is best planned with the help of an attorney. Theconversationproject.org and agingwithdignity.org are excellent resources.

## 2015-05-03 NOTE — Progress Notes (Signed)
[facility]  PATIENT: Sherri Leonard DOB: 08-02-1920  REFERRING CLINICIAN: Betsey Holiday NP / Burney Gauze MD HISTORY FROM: patient and EPIC chart review REASON FOR VISIT: new consult    HISTORICAL  CHIEF COMPLAINT:  Chief Complaint  Patient presents with  . Altered Mental Status    Sherri Leonard is here as a new pt. for eval of altered mental status.  She is alone, dropped off by transportation for [facility] (phone # [phone], fax # [phone]).  She is able to tell me her first name only. Sts. she is here by herself because her husband is working./fim    HISTORY OF PRESENT ILLNESS:   81 year old female here for evaluation of abnormal movements. Vision has history of severe dementia. She arrives here alone for this visit from her rehabilitation facility. Information reviewed and obtained from chart. Patient was admitted to the hospital in 04/06/2015 for confusion, delirium, eyes rolling back, difficult to arouse, with multiple events. She was restless, unable to walk, confused with her speech and language. Patient was admitted to the hospital and found to have lactic acidosis, possible dehydration and borderline UTI. CT scan of the head showed no acute findings. EEG showed mild to moderate slowing. Neurology consultation was also obtained. Possibility of superimposed parkinsonism was raised and patient was started on amantadine and discharged to skilled nursing facility for short-term rehabilitation.  Presently patient is stable. She is on Namenda for dementia. Apparently she was on amantadine 100 mg daily [facility], however I do not see this medication on her medication list from her facility today. Patient is calm and cooperative today. She has no specific complaints.   REVIEW OF SYSTEMS: Full 14 system review of systems performed and notable only for only as per history of present illness. Otherwise  negative.  ALLERGIES: Allergies  Allergen Reactions  . Ativan [Lorazepam] Other (See Comments)    Causes violence   . Benzodiazepines     HOME MEDICATIONS: Outpatient Prescriptions Prior to Visit  Medication Sig Dispense Refill  . cholecalciferol (VITAMIN D) 1000 UNITS tablet Take 1 tablet (1,000 Units total) by mouth daily. 30 tablet 3  . memantine (NAMENDA XR) 28 MG CP24 24 hr capsule Take 1 capsule (28 mg total) by mouth daily. 30 capsule 3  . mirtazapine (REMERON) 7.5 MG tablet Take 1 tablet (7.5 mg total) by mouth at bedtime. 30 tablet 3  . sertraline (ZOLOFT) 25 MG tablet Take 1 tablet (25 mg total) by mouth daily. 30 tablet 3  . amantadine (SYMMETREL) 100 MG capsule Take 1 capsule (100 mg total) by mouth daily. (Patient not taking: Reported on 05/03/2015)    . cefpodoxime (VANTIN) 200 MG tablet Take 1 tablet (200 mg total) by mouth 2 (two) times daily. For 3 more days (Patient not taking: Reported on 05/03/2015) 12 tablet 0   No facility-administered medications prior to visit.    PAST MEDICAL HISTORY: Past Medical History  Diagnosis Date  . Dementia   . Pacemaker   . OA (osteoarthritis)     PAST SURGICAL HISTORY: Past Surgical History  Procedure Laterality Date  . Knee arthroplasty Bilateral   . Carpal tunnel release Bilateral   . Brain surgery  2013  . Atrial cardiac pacemaker insertion  2012  . Flexible sigmoidoscopy N/A 09/12/2014    Procedure: FLEXIBLE SIGMOIDOSCOPY;  Surgeon: Beverley Fiedler, MD;  Location: [facility];  Service: Gastroenterology;  Laterality: N/A;    FAMILY  HISTORY: Family History  Problem Relation Age of Onset  . Heart disease Son   . Cancer Son 60    on Liver  . Colon polyps Neg Hx   . Diabetes Neg Hx   . Kidney disease Neg Hx   . Heart attack Son   . Alzheimer's disease Mother   . Alzheimer's disease Father     SOCIAL HISTORY:  Social History   Social History  . Marital Status: Widowed    Spouse Name: N/A  . Number of  Children: 1  . Years of Education: N/A   Occupational History  . retired    Social History Main Topics  . Smoking status: Never Smoker   . Smokeless tobacco: Never Used  . Alcohol Use: No  . Drug Use: No  . Sexual Activity: No   Other Topics Concern  . Not on file   Social History Narrative   Diet:   Do you drink/eat things with caffeine? No   Marital status:     Widowed                         What year were you married?   Do you live in a house, apartment, assisted living, condo, trailer, etc)? Home   Is it one or more stories? One   How many persons live in your home? Two   Do you have any pets in your home? No   Current or past profession:   Do you exercise?      No                                              Type & how often:   Do you have a living will? Yes   Do you have a DNR Form? Yes   Do you have a POA/HPOA forms? Yes     PHYSICAL EXAM  GENERAL EXAM/CONSTITUTIONAL: Vitals:  Filed Vitals:   05/03/15 0837  BP: 102/60     There is no weight on file to calculate BMI.  No exam data present  Patient is in no distress; well developed, nourished and groomed; neck is supple  CARDIOVASCULAR:  Examination of carotid arteries is normal; no carotid bruits  Regular rate and rhythm, no murmurs  Examination of peripheral vascular system by observation and palpation is normal  EYES:  Ophthalmoscopic exam of optic discs and posterior segments is normal; no papilledema or hemorrhages  MUSCULOSKELETAL:  Gait, strength, tone, movements noted in Neurologic exam below  NEUROLOGIC: MENTAL STATUS:  MMSE - Mini Mental State Exam 09/29/2014  Orientation to time 1  Orientation to Place 1  Registration 3  Attention/ Calculation 0  Recall 0  Language- name 2 objects 2  Language- repeat 1  Language- follow 3 step command 2  Language- read & follow direction 0  Write a sentence 0  Copy design 0  Total score 10    awake, alert, oriented to person; NOT PLACE  OR TIME  DECR recent and remote memory  DECR attention and concentration  DECR fluency/comprehension; ABLE TO NAME PEN, BUT NOT COMPUTER  DECR fund of knowledge  CRANIAL NERVE:   2nd - no papilledema on fundoscopic exam  2nd, 3rd, 4th, 6th - pupils equal and reactive to light, visual fields full to confrontation, extraocular muscles intact, no nystagmus  5th -  facial sensation symmetric  7th - facial strength symmetric  8th - hearing intact  9th - palate elevates symmetrically, uvula midline  11th - shoulder shrug symmetric  12th - tongue protrusion midline  MOTOR:   FINE REST TREMOR; INTERMITTENT PICKING BEHAVIOR OF CLOTHES AND SHEET; MOVES ALL EXT SYMM, ANTI-GRAVITY  SENSORY:   normal and symmetric to light touch, temperature, vibration  COORDINATION:   finger-nose-finger, fine finger movements SLOW  REFLEXES:   deep tendon reflexes TRACE and symmetric  GAIT/STATION:   IN WHEEL CHAIR; CANNOT STAND UNASSISTED    DIAGNOSTIC DATA (LABS, IMAGING, TESTING) - I reviewed patient records, labs, notes, testing and imaging myself where available.  Lab Results  Component Value Date   WBC 5.7 04/07/2015   HGB 11.4* 04/07/2015   HCT 35.8* 04/07/2015   MCV 95.7 04/07/2015   PLT 139* 04/07/2015      Component Value Date/Time   NA 141 04/07/2015 0552   NA 142 03/27/2015 1657   K 3.7 04/07/2015 0552   CL 106 04/07/2015 0552   CO2 28 04/07/2015 0552   GLUCOSE 89 04/07/2015 0552   GLUCOSE 105* 03/27/2015 1657   BUN 25* 04/07/2015 0552   BUN 25 03/27/2015 1657   CREATININE 0.70 04/07/2015 0552   CALCIUM 8.7* 04/07/2015 0552   PROT 6.8 04/07/2015 0552   PROT 7.1 03/27/2015 1657   ALBUMIN 3.1* 04/07/2015 0552   ALBUMIN 3.9 03/27/2015 1657   AST 18 04/07/2015 0552   ALT 14 04/07/2015 0552   ALKPHOS 70 04/07/2015 0552   BILITOT 0.5 04/07/2015 0552   BILITOT <0.2 03/27/2015 1657   GFRNONAA >60 04/07/2015 0552   GFRAA >60 04/07/2015 0552   No results  found for: CHOL, HDL, LDLCALC, LDLDIRECT, TRIG, CHOLHDL Lab Results  Component Value Date   HGBA1C 6.1* 04/06/2015   Lab Results  Component Value Date   VITAMINB12 373 04/06/2015   Lab Results  Component Value Date   TSH 1.740 03/27/2015    04/06/15 CT head  - No evidence of acute intracranial abnormality. - Atrophy with small vessel ischemic changes.    ASSESSMENT AND PLAN  81 y.o. year old female here with advanced dementia and age, with mild tremors, likely related to underlying neurodegenerative disorder (dementia, alzheimer's vs dementia with lewy bodies). Increased abnormal movements from hospital stay were likely related to metabolic encephalopathy (dehydration, lactic acidosis). Was on amantadine previously, but not listed on facility [facility]. Would not restart amantatine at this time. If she is currently taking this, would consider stopping it.  Ddx: dementia (alzheimer's vs dementia with lewy bodies)  Severe dementia  Tremor    PLAN: - no further testing or treatment advised from tremor standpoint (currently mild tremor, severe dementia, advanced age) - would advise palliative care treatment/approach  Return if symptoms worsen or fail to improve, for return to PCP.    Suanne MarkerVIKRAM R. PENUMALLI, MD 05/03/2015, 8:59 AM Certified in Neurology, Neurophysiology and Neuroimaging  [facility] [address], [address] [place], [place] [address] [phone]

## 2015-05-23 ENCOUNTER — Telehealth: Payer: Self-pay | Admitting: Diagnostic Neuroimaging

## 2015-05-23 DIAGNOSIS — F0390 Unspecified dementia without behavioral disturbance: Secondary | ICD-10-CM

## 2015-05-23 NOTE — Telephone Encounter (Signed)
Patient's granddaughter is calling and states that she needs for Dr. Marjory LiesPenumalli to call her in regard to how to proceed with care for her granddaughter.  She states Dr. Richrd HumblesPenumalli's recommendation is for her to have Palative care and she would like to talk with him about this. The grandmother is in Rehab at the moment. Please call @ [phone].  Thanks!

## 2015-05-25 ENCOUNTER — Non-Acute Institutional Stay (SKILLED_NURSING_FACILITY): Payer: Medicare Other | Admitting: Adult Health

## 2015-05-25 DIAGNOSIS — D649 Anemia, unspecified: Secondary | ICD-10-CM

## 2015-05-25 DIAGNOSIS — F0280 Dementia in other diseases classified elsewhere without behavioral disturbance: Secondary | ICD-10-CM | POA: Diagnosis not present

## 2015-05-25 DIAGNOSIS — R259 Unspecified abnormal involuntary movements: Secondary | ICD-10-CM

## 2015-05-25 DIAGNOSIS — G301 Alzheimer's disease with late onset: Secondary | ICD-10-CM

## 2015-05-25 DIAGNOSIS — F0393 Unspecified dementia, unspecified severity, with mood disturbance: Secondary | ICD-10-CM

## 2015-05-25 DIAGNOSIS — F329 Major depressive disorder, single episode, unspecified: Secondary | ICD-10-CM | POA: Diagnosis not present

## 2015-05-25 NOTE — Telephone Encounter (Signed)
LVM requesting call back. Left this caller's name, number. 

## 2015-05-30 ENCOUNTER — Telehealth: Payer: Self-pay | Admitting: *Deleted

## 2015-05-30 NOTE — Telephone Encounter (Signed)
Spoke to patient's grand daughter who states she was unable to speak to Dr Marjory LiesPenumalli when her grandmother was seen in Dec 2016. She is unsure how to proceed with palliative care for her grandmother. She stated that rehab at [facility] "did not think patient needed palliative care", but grandaughter stated that she "has known my grandmother for a long time, and this is a big change for her." She stated she agreed to palliative care. Advised her to speak with Geriatric Medicine which has been following pt for dementia as they would know her history well. She verbalized understanding, appreciation for call back.

## 2015-05-30 NOTE — Telephone Encounter (Signed)
Patient grand-daughter called and left message on voicemail stating that she had concerns regarding Neuro. recommending pallative care for patient but states the facility does not think she needs it. [facility] I called and spoke with daughter and she stated that she wants patient to be released from Rehab with Hospice care. Instructed her to call and speak with the patient's nurse there and explained the process. She agreed.

## 2015-06-15 ENCOUNTER — Telehealth: Payer: Self-pay | Admitting: *Deleted

## 2015-06-15 NOTE — Telephone Encounter (Signed)
Spoke with grand daughter who stated she did not contact PCP for palliative care consult as this RN recommended in previous phone conversation; she stated "because she's been at rehab and hasn't been seeing her primary dr". She stated she doesn't  understand why [facility] rehab said she requested palliative care, and stated they "don't know how to place referral". She inquired why Dr Marjory Lies recommended palliative care. Discussed Dr Richrd Humbles OV note with grand daughter. She is requesting Dr Marjory Lies place referral to palliative care because "he is the neurologist". Will place referral per Dr Marjory Lies.

## 2015-06-15 NOTE — Addendum Note (Signed)
Addended by: Colen Darling C on: 06/15/2015 12:06 PM   Modules accepted: Orders

## 2015-06-15 NOTE — Telephone Encounter (Signed)
LVM requesting calll back re: patient referred for palliative care consult. Left this caller's name, number.

## 2015-06-15 NOTE — Telephone Encounter (Signed)
Sherri Leonard [phone] called requesting to speak with nurse or Sherri Leonard, states Sherri Leonard asked why did she want Palliative evaluation as if the family requested it, states Sherri Leonard was the one who recommended it. Wants to know why Sherri Leonard recommended it? So that she can sound articulate, needs direction on what to do.

## 2015-06-19 ENCOUNTER — Telehealth: Payer: Self-pay | Admitting: *Deleted

## 2015-06-19 NOTE — Telephone Encounter (Signed)
Patient Lives in [place] so I have faxed order and notes to [facility]. [name] telephone [phone] Fax. [phone].

## 2015-06-19 NOTE — Telephone Encounter (Signed)
Shanda Bumps returned this RN's call from last week. Informed her that because patient lives in [place], referral has been sent to [facility] in [place]. She stated that "was totally appropriate in this case". SHe thanked this RN for call last week and verbalized understanding.

## 2015-06-26 ENCOUNTER — Non-Acute Institutional Stay (SKILLED_NURSING_FACILITY): Payer: Medicare Other | Admitting: Adult Health

## 2015-06-26 DIAGNOSIS — F0280 Dementia in other diseases classified elsewhere without behavioral disturbance: Secondary | ICD-10-CM

## 2015-06-26 DIAGNOSIS — G301 Alzheimer's disease with late onset: Secondary | ICD-10-CM

## 2015-06-26 DIAGNOSIS — R259 Unspecified abnormal involuntary movements: Secondary | ICD-10-CM | POA: Diagnosis not present

## 2015-06-26 DIAGNOSIS — F0393 Unspecified dementia, unspecified severity, with mood disturbance: Secondary | ICD-10-CM

## 2015-06-26 DIAGNOSIS — R634 Abnormal weight loss: Secondary | ICD-10-CM | POA: Diagnosis not present

## 2015-06-26 DIAGNOSIS — F329 Major depressive disorder, single episode, unspecified: Secondary | ICD-10-CM | POA: Diagnosis not present

## 2015-06-29 ENCOUNTER — Ambulatory Visit: Payer: Self-pay | Admitting: Internal Medicine

## 2015-07-16 ENCOUNTER — Encounter: Payer: Self-pay | Admitting: Adult Health

## 2015-07-16 NOTE — Progress Notes (Signed)
Patient ID: Sherri Leonard, female   DOB: [birth year], 82 y.o.   MRN: [id number]    [facility]       Allergies  Allergen Reactions  . Ativan [Lorazepam] Other (See Comments)    Causes violence   . Benzodiazepines     Chief Complaint  Patient presents with  . Medical Management of Chronic Issues    HPI:  She is a resident of this facility being seen for the management of her chronic illnesses. Overall there is little change in her status. She tells me that she is feeling good; is not voicing any complaints or concerns at this time. There are no nursing concerns at this time.   Past Medical History  Diagnosis Date  . Dementia   . Pacemaker   . OA (osteoarthritis)     Past Surgical History  Procedure Laterality Date  . Knee arthroplasty Bilateral   . Carpal tunnel release Bilateral   . Brain surgery  2013  . Atrial cardiac pacemaker insertion  2012  . Flexible sigmoidoscopy N/A 09/12/2014    Procedure: FLEXIBLE SIGMOIDOSCOPY;  Surgeon: Beverley Fiedler, MD;  Location: [facility];  Service: Gastroenterology;  Laterality: N/A;    VITAL SIGNS BP 95/60 mmHg  Pulse 67  Ht  (1.549 m)  Wt 117 lb (53.071 kg)  BMI 22.12 kg/m2  Patient's Medications  New Prescriptions   No medications on file  Previous Medications   AMANTADINE (SYMMETREL) 100 MG CAPSULE    Take 1 capsule (100 mg total) by mouth daily.   CHOLECALCIFEROL (VITAMIN D) 1000 UNITS TABLET    Take 1 tablet (1,000 Units total) by mouth daily.   MELATONIN 3 MG TABS    Take 3 mg by mouth.   MEMANTINE (NAMENDA XR) 28 MG CP24 24 HR CAPSULE    Take 1 capsule (28 mg total) by mouth daily.   MIRTAZAPINE (REMERON) 7.5 MG TABLET    Take 1 tablet (7.5 mg total) by mouth at bedtime.   SERTRALINE (ZOLOFT) 25 MG TABLET    Take 1 tablet (25 mg total) by mouth daily.  Modified Medications   No medications on file  Discontinued Medications   No medications on file     SIGNIFICANT DIAGNOSTIC  EXAMS  04-06-15: ct of head: No evidence of acute intracranial abnormality. Atrophy with small vessel ischemic changes  04-06-15: acute abdomen with chest x-ray: 1. No acute cardiopulmonary disease. 2. Normal bowel gas pattern. 3. Atherosclerosis.  04-07-15: EEG: Impression: This awake and asleep EEG is abnormal due to mild to moderate diffuse slowing of the waking background. Clinical Correlation of the above findings indicates diffuse cerebral dysfunction that is non-specific in etiology and can be seen with hypoxic/ischemic injury, toxic/metabolic encephalopathies, neurodegenerative disorders, or medication effect.  The absence of epileptiform discharges does not rule out a clinical diagnosis of epilepsy.  Clinical correlation is advised.   LABS REVIEWED:   03-27-15: tsh 1.740 04-06-15: wbc 5.4; hgb 11.4; hct 35.9; mcv 96.2; plt 151; glucose 218; bun 32; creaet 1.02; k+ 4.3; na++140; liver normal albumin 3.5; mag 1.8; phos 3.2; vit b12: 373; iron 33; tibc 181; ferritin 303; ammonia 24;  Folate 11.5 04-07-15: urine culture: multiple bacteria  04-17-15: wbc 6.6; hgb 11.9; hct 36.2; mcv 91.4; plt 250; glucose 83; bun 11; creat 0.70; k+ 4.9; na++138      Review of Systems  Constitutional: Negative for malaise/fatigue.  Respiratory: Negative for cough and shortness of breath.   Cardiovascular: Negative for chest  pain and leg swelling.  Gastrointestinal: Negative for abdominal pain and constipation.  Musculoskeletal: Negative for myalgias and joint pain.  Skin: Negative.   Neurological: Negative for dizziness.  Psychiatric/Behavioral: The patient is not nervous/anxious.      Physical Exam  Constitutional: No distress.  Frail   Eyes: Conjunctivae are normal.  Neck: Neck supple. No JVD present. No thyromegaly present.  Cardiovascular: Normal rate, regular rhythm and intact distal pulses.   Respiratory: Effort normal and breath sounds normal. No respiratory distress. She has no  wheezes.  GI: Soft. Bowel sounds are normal. She exhibits no distension. There is no tenderness.  Musculoskeletal: She exhibits no edema.  Able to move all extremities   Lymphadenopathy:    She has no cervical adenopathy.  Neurological: She is alert.  Skin: Skin is warm and dry. She is not diaphoretic.  Psychiatric: She has a normal mood and affect.       ASSESSMENT/ PLAN: .  1. Alzheimer's disease: no significant change in her status; will continue namenda xr 28 mg daily her current weight is 117 pounds  2. Possible parkinson's disease: will continue amantadine 100 mg daily will monitor her status and will follow up with neurology as ordered.   3. Depression: will continue zoloft 25 mg daily is taking remeron 7.5 mg nightly for rest and to help with appetite. Her current weight is 117 pounds.   4. Anemia: her current hgb is 11.9; is presently not taking medications.         Synthia Innocent NP [facility]  Contact [phone] Monday through Friday 8am- 5pm  After hours call [phone]

## 2015-08-02 ENCOUNTER — Non-Acute Institutional Stay (SKILLED_NURSING_FACILITY): Payer: Medicare Other | Admitting: Adult Health

## 2015-08-02 ENCOUNTER — Encounter: Payer: Self-pay | Admitting: Adult Health

## 2015-08-02 DIAGNOSIS — F0280 Dementia in other diseases classified elsewhere without behavioral disturbance: Secondary | ICD-10-CM | POA: Diagnosis not present

## 2015-08-02 DIAGNOSIS — D649 Anemia, unspecified: Secondary | ICD-10-CM | POA: Diagnosis not present

## 2015-08-02 DIAGNOSIS — F329 Major depressive disorder, single episode, unspecified: Secondary | ICD-10-CM

## 2015-08-02 DIAGNOSIS — R634 Abnormal weight loss: Secondary | ICD-10-CM | POA: Insufficient documentation

## 2015-08-02 DIAGNOSIS — R259 Unspecified abnormal involuntary movements: Secondary | ICD-10-CM | POA: Diagnosis not present

## 2015-08-02 DIAGNOSIS — G301 Alzheimer's disease with late onset: Secondary | ICD-10-CM | POA: Diagnosis not present

## 2015-08-02 DIAGNOSIS — F0393 Unspecified dementia, unspecified severity, with mood disturbance: Secondary | ICD-10-CM

## 2015-08-02 NOTE — Progress Notes (Signed)
Patient ID: Sherri Leonard, female   DOB: 01/13/1921, 82 y.o.   MRN: [id number]   [facility]       Allergies  Allergen Reactions  . Ativan [Lorazepam] Other (See Comments)    Causes violence   . Benzodiazepines     Chief Complaint  Patient presents with  . Medical Management of Chronic Issues    Follow up    HPI:  She is a long term resident of this facility being seen for the management of her chronic illnesses. There is no significant change in her status. She tells me today that she is feeling good and has no concerns. There are no nursing concerns at this time.    Past Medical History  Diagnosis Date  . Dementia   . Pacemaker   . OA (osteoarthritis)     Past Surgical History  Procedure Laterality Date  . Knee arthroplasty Bilateral   . Carpal tunnel release Bilateral   . Brain surgery  2013  . Atrial cardiac pacemaker insertion  2012  . Flexible sigmoidoscopy N/A 09/12/2014    Procedure: FLEXIBLE SIGMOIDOSCOPY;  Surgeon: Beverley FiedlerJay M Pyrtle, MD;  Location: [facility];  Service: Gastroenterology;  Laterality: N/A;    VITAL SIGNS BP 126/70 mmHg  Pulse 78  Temp(Src) 98.2 F (36.8 C) (Oral)  Resp 18  SpO2 97%  Patient's Medications  New Prescriptions   No medications on file  Previous Medications   ACETAMINOPHEN (TYLENOL) 325 MG TABLET    Take 650 mg by mouth every 4 (four) hours as needed.   CHOLECALCIFEROL (VITAMIN D) 1000 UNITS TABLET    Take 1 tablet (1,000 Units total) by mouth daily.   MELATONIN 3 MG TABS    Take 3 mg by mouth.   OSELTAMIVIR (TAMIFLU) 75 MG CAPSULE    Take 75 mg by mouth daily. X 14 days until 08/12/15   SERTRALINE (ZOLOFT) 25 MG TABLET    Take 1 tablet (25 mg total) by mouth daily.  Modified Medications   No medications on file  Discontinued Medications     SIGNIFICANT DIAGNOSTIC EXAMS 04-06-15: ct of head: No evidence of acute intracranial abnormality. Atrophy with small vessel ischemic changes  04-06-15: acute  abdomen with chest x-ray: 1. No acute cardiopulmonary disease. 2. Normal bowel gas pattern. 3. Atherosclerosis.  04-07-15: EEG: Impression: This awake and asleep EEG is abnormal due to mild to moderate diffuse slowing of the waking background. Clinical Correlation of the above findings indicates diffuse cerebral dysfunction that is non-specific in etiology and can be seen with hypoxic/ischemic injury, toxic/metabolic encephalopathies, neurodegenerative disorders, or medication effect.  The absence of epileptiform discharges does not rule out a clinical diagnosis of epilepsy.  Clinical correlation is advised.   LABS REVIEWED:   03-27-15: tsh 1.740 04-06-15: wbc 5.4; hgb 11.4; hct 35.9; mcv 96.2; plt 151; glucose 218; bun 32; creaet 1.02; k+ 4.3; na++140; liver normal albumin 3.5; mag 1.8; phos 3.2; vit b12: 373; iron 33; tibc 181; ferritin 303; ammonia 24;  Folate 11.5 04-07-15: urine culture: multiple bacteria  04-17-15: wbc 6.6; hgb 11.9; hct 36.2; mcv 91.4; plt 250; glucose 83; bun 11; creat 0.70; k+ 4.9; na++138      Review of Systems  Constitutional: Negative for malaise/fatigue.  Respiratory: Negative for cough and shortness of breath.   Cardiovascular: Negative for chest pain and leg swelling.  Gastrointestinal: Negative for abdominal pain and constipation.  Musculoskeletal: Negative for myalgias and joint pain.  Skin: Negative.   Neurological: Negative for  dizziness.  Psychiatric/Behavioral: The patient is not nervous/anxious.      Physical Exam  Constitutional: No distress.  Frail   Eyes: Conjunctivae are normal.  Neck: Neck supple. No JVD present. No thyromegaly present.  Cardiovascular: Normal rate, regular rhythm and intact distal pulses.   Respiratory: Effort normal and breath sounds normal. No respiratory distress. She has no wheezes.  GI: Soft. Bowel sounds are normal. She exhibits no distension. There is no tenderness.  Musculoskeletal: She exhibits no edema.    Able to move all extremities   Lymphadenopathy:    She has no cervical adenopathy.  Neurological: She is alert.  Skin: Skin is warm and dry. She is not diaphoretic.  Psychiatric: She has a normal mood and affect.       ASSESSMENT/ PLAN: .  1. Alzheimer's disease: no significant change in her status; is presently not on medications; will not make changes will monitor   2. Possible parkinson's disease: will continue amantadine 100 mg daily will monitor her status and will follow up with neurology as ordered.   3. Depression: will continue zoloft 25 mg daily. Her current weight is 110 pounds. Will reduce her remeron to every other night for 5 doses then stop and will monitor   4. Anemia: her current hgb is 11.9; is presently not taking medications.             Synthia Innocent NP [facility]  Contact [phone] Monday through Friday 8am- 5pm  After hours call [phone]

## 2015-08-02 NOTE — Progress Notes (Signed)
Patient ID: Sherri Leonard, female   DOB: 11-24-1920, 82 y.o.   MRN: [id number]   [facility]       Allergies  Allergen Reactions  . Ativan [Lorazepam] Other (See Comments)    Causes violence   . Benzodiazepines     Chief Complaint  Patient presents with  . Medical Management of Chronic Issues    HPI:  She is a long term resident of this facility being seen for the management of her chronic illnesses.  Overall there is little change in her status.  She has lost weight from 117 pounds to her current weight of 110 pounds; she is presently taking remeron for appetite. This medication is not effective and will need to be stopped. She is not voicing any complaints today.    Past Medical History  Diagnosis Date  . Dementia   . Pacemaker   . OA (osteoarthritis)     Past Surgical History  Procedure Laterality Date  . Knee arthroplasty Bilateral   . Carpal tunnel release Bilateral   . Brain surgery  2013  . Atrial cardiac pacemaker insertion  2012  . Flexible sigmoidoscopy N/A 09/12/2014    Procedure: FLEXIBLE SIGMOIDOSCOPY;  Surgeon: Beverley Fiedler, MD;  Location: [facility];  Service: Gastroenterology;  Laterality: N/A;    VITAL SIGNS BP 99/51 mmHg  Pulse 75  Ht  (1.549 m)  Wt 110 lb (49.896 kg)  BMI 20.80 kg/m2  SpO2 97%  Patient's Medications  New Prescriptions   No medications on file  Previous Medications   AMANTADINE (SYMMETREL) 100 MG CAPSULE    Take 1 capsule (100 mg total) by mouth daily.   CEFPODOXIME (VANTIN) 200 MG TABLET    Take 1 tablet (200 mg total) by mouth 2 (two) times daily. For 3 more days   CHOLECALCIFEROL (VITAMIN D) 1000 UNITS TABLET    Take 1 tablet (1,000 Units total) by mouth daily.   MELATONIN 3 MG TABS    Take 3 mg by mouth.   MEMANTINE (NAMENDA XR) 28 MG CP24 24 HR CAPSULE    Take 1 capsule (28 mg total) by mouth daily.   MIRTAZAPINE (REMERON) 7.5 MG TABLET    Take 1 tablet (7.5 mg total) by mouth at bedtime.   SERTRALINE (ZOLOFT) 25 MG TABLET    Take 1 tablet (25 mg total) by mouth daily.  Modified Medications   No medications on file  Discontinued Medications   No medications on file     SIGNIFICANT DIAGNOSTIC EXAMS   04-06-15: ct of head: No evidence of acute intracranial abnormality. Atrophy with small vessel ischemic changes  04-06-15: acute abdomen with chest x-ray: 1. No acute cardiopulmonary disease. 2. Normal bowel gas pattern. 3. Atherosclerosis.  04-07-15: EEG: Impression: This awake and asleep EEG is abnormal due to mild to moderate diffuse slowing of the waking background. Clinical Correlation of the above findings indicates diffuse cerebral dysfunction that is non-specific in etiology and can be seen with hypoxic/ischemic injury, toxic/metabolic encephalopathies, neurodegenerative disorders, or medication effect.  The absence of epileptiform discharges does not rule out a clinical diagnosis of epilepsy.  Clinical correlation is advised.   LABS REVIEWED:   03-27-15: tsh 1.740 04-06-15: wbc 5.4; hgb 11.4; hct 35.9; mcv 96.2; plt 151; glucose 218; bun 32; creaet 1.02; k+ 4.3; na++140; liver normal albumin 3.5; mag 1.8; phos 3.2; vit b12: 373; iron 33; tibc 181; ferritin 303; ammonia 24;  Folate 11.5 04-07-15: urine culture: multiple bacteria  04-17-15: wbc  6.6; hgb 11.9; hct 36.2; mcv 91.4; plt 250; glucose 83; bun 11; creat 0.70; k+ 4.9; na++138      Review of Systems  Constitutional: Negative for malaise/fatigue.  Respiratory: Negative for cough and shortness of breath.   Cardiovascular: Negative for chest pain and leg swelling.  Gastrointestinal: Negative for abdominal pain and constipation.  Musculoskeletal: Negative for myalgias and joint pain.  Skin: Negative.   Neurological: Negative for dizziness.  Psychiatric/Behavioral: The patient is not nervous/anxious.      Physical Exam  Constitutional: No distress.  Frail   Eyes: Conjunctivae are normal.  Neck: Neck  supple. No JVD present. No thyromegaly present.  Cardiovascular: Normal rate, regular rhythm and intact distal pulses.   Respiratory: Effort normal and breath sounds normal. No respiratory distress. She has no wheezes.  GI: Soft. Bowel sounds are normal. She exhibits no distension. There is no tenderness.  Musculoskeletal: She exhibits no edema.  Able to move all extremities   Lymphadenopathy:    She has no cervical adenopathy.  Neurological: She is alert.  Skin: Skin is warm and dry. She is not diaphoretic.  Psychiatric: She has a normal mood and affect.       ASSESSMENT/ PLAN: .  1. Alzheimer's disease: no significant change in her status; will continue namenda xr 28 mg daily her current weight is 110 pounds  2. Possible parkinson's disease: will continue amantadine 100 mg daily will monitor her status and will follow up with neurology as ordered.   3. Depression: will continue zoloft 25 mg daily. Her current weight is 110 pounds. Will reduce her remeron to every other night for 5 doses then stop and will monitor   4. Anemia: her current hgb is 11.9; is presently not taking medications.         Synthia Innocenteborah Moneisha Vosler NP [facility]  Contact [phone] Monday through Friday 8am- 5pm  After hours call [phone]

## 2015-09-07 ENCOUNTER — Non-Acute Institutional Stay (SKILLED_NURSING_FACILITY): Payer: Medicare Other | Admitting: Adult Health

## 2015-09-07 ENCOUNTER — Encounter: Payer: Self-pay | Admitting: Adult Health

## 2015-09-07 DIAGNOSIS — I10 Essential (primary) hypertension: Secondary | ICD-10-CM

## 2015-09-07 DIAGNOSIS — G301 Alzheimer's disease with late onset: Secondary | ICD-10-CM

## 2015-09-07 DIAGNOSIS — R259 Unspecified abnormal involuntary movements: Secondary | ICD-10-CM | POA: Diagnosis not present

## 2015-09-07 DIAGNOSIS — F0393 Unspecified dementia, unspecified severity, with mood disturbance: Secondary | ICD-10-CM

## 2015-09-07 DIAGNOSIS — M1990 Unspecified osteoarthritis, unspecified site: Secondary | ICD-10-CM | POA: Diagnosis not present

## 2015-09-07 DIAGNOSIS — F329 Major depressive disorder, single episode, unspecified: Secondary | ICD-10-CM

## 2015-09-07 DIAGNOSIS — F0280 Dementia in other diseases classified elsewhere without behavioral disturbance: Secondary | ICD-10-CM

## 2015-09-07 DIAGNOSIS — D649 Anemia, unspecified: Secondary | ICD-10-CM | POA: Diagnosis not present

## 2015-09-07 NOTE — Progress Notes (Signed)
Patient ID: Sherri Leonard, female   DOB: [birth year], 82 y.o.   MRN: [id number]   [facility]       Allergies  Allergen Reactions  . Ativan [Lorazepam] Other (See Comments)    Causes violence   . Benzodiazepines     Chief Complaint  Patient presents with  . Medical Management of Chronic Issues    Follow up    HPI:  She is a long term resident of this facility being seen for the management of her chronic illnesses. Overall her status is stable. She is unable to fully participate in the hpi or ros; but did tell me that she is feeling good. There are no nursing concerns at this time.    Past Medical History  Diagnosis Date  . Dementia   . Pacemaker   . OA (osteoarthritis)     Past Surgical History  Procedure Laterality Date  . Knee arthroplasty Bilateral   . Carpal tunnel release Bilateral   . Brain surgery  2013  . Atrial cardiac pacemaker insertion  2012  . Flexible sigmoidoscopy N/A 09/12/2014    Procedure: FLEXIBLE SIGMOIDOSCOPY;  Surgeon: Beverley Fiedler, MD;  Location: [facility];  Service: Gastroenterology;  Laterality: N/A;    VITAL SIGNS BP 98/50 mmHg  Pulse 95  Temp(Src) 97.4 F (36.3 C) (Oral)  Resp 17  Ht  (1.549 m)  Wt 128 lb 6 oz (58.231 kg)  BMI 24.27 kg/m2  SpO2 97%  Patient's Medications  New Prescriptions   No medications on file  Previous Medications   ACETAMINOPHEN (TYLENOL) 325 MG TABLET    Take 650 mg by mouth every 4 (four) hours as needed.   CHOLECALCIFEROL (VITAMIN D) 1000 UNITS TABLET    Take 1 tablet (1,000 Units total) by mouth daily.   MELATONIN 3 MG TABS    Take 3 mg by mouth.   SERTRALINE (ZOLOFT) 25 MG TABLET    Take 1 tablet (25 mg total) by mouth daily.  Modified Medications   No medications on file  Discontinued Medications   OSELTAMIVIR (TAMIFLU) 75 MG CAPSULE    Take 75 mg by mouth daily. Reported on 09/07/2015     SIGNIFICANT DIAGNOSTIC EXAMS  04-06-15: ct of head: No evidence of acute intracranial  abnormality. Atrophy with small vessel ischemic changes  04-06-15: acute abdomen with chest x-ray: 1. No acute cardiopulmonary disease. 2. Normal bowel gas pattern. 3. Atherosclerosis.  04-07-15: EEG: Impression: This awake and asleep EEG is abnormal due to mild to moderate diffuse slowing of the waking background. Clinical Correlation of the above findings indicates diffuse cerebral dysfunction that is non-specific in etiology and can be seen with hypoxic/ischemic injury, toxic/metabolic encephalopathies, neurodegenerative disorders, or medication effect.  The absence of epileptiform discharges does not rule out a clinical diagnosis of epilepsy.  Clinical correlation is advised.   LABS REVIEWED:   03-27-15: tsh 1.740 04-06-15: wbc 5.4; hgb 11.4; hct 35.9; mcv 96.2; plt 151; glucose 218; bun 32; creaet 1.02; k+ 4.3; na++140; liver normal albumin 3.5; mag 1.8; phos 3.2; vit b12: 373; iron 33; tibc 181; ferritin 303; ammonia 24;  Folate 11.5 04-07-15: urine culture: multiple bacteria  04-17-15: wbc 6.6; hgb 11.9; hct 36.2; mcv 91.4; plt 250; glucose 83; bun 11; creat 0.70; k+ 4.9; na++138      Review of Systems  Constitutional: Negative for malaise/fatigue.  Respiratory: Negative for cough and shortness of breath.   Cardiovascular: Negative for chest pain and leg swelling.  Gastrointestinal: Negative  for abdominal pain and constipation.  Musculoskeletal: Negative for myalgias and joint pain.  Skin: Negative.   Neurological: Negative for dizziness.  Psychiatric/Behavioral: The patient is not nervous/anxious.      Physical Exam  Constitutional: No distress.  Frail   Eyes: Conjunctivae are normal.  Neck: Neck supple. No JVD present. No thyromegaly present.  Cardiovascular: Normal rate, regular rhythm and intact distal pulses.   Respiratory: Effort normal and breath sounds normal. No respiratory distress. She has no wheezes.  GI: Soft. Bowel sounds are normal. She exhibits no  distension. There is no tenderness.  Musculoskeletal: She exhibits no edema.  Able to move all extremities   Lymphadenopathy:    She has no cervical adenopathy.  Neurological: She is alert.  Skin: Skin is warm and dry. She is not diaphoretic.  Psychiatric: She has a normal mood and affect.       ASSESSMENT/ PLAN: .  1. Alzheimer's disease: no significant change in her status; is presently not on medications; will not make changes will monitor   2. Possible parkinson's disease: is presently not on medications; will not make changes will monitor her status.   3. Depression: will continue zoloft 25 mg daily. Her current weight is 128.5 pounds. Her remeron was stopped.   4. Anemia: her current hgb is 11.9; is presently not taking medications.      Synthia Innocenteborah Jovanka Westgate NP [facility]  Contact [phone] Monday through Friday 8am- 5pm  After hours call [phone]

## 2015-10-07 ENCOUNTER — Encounter: Payer: Self-pay | Admitting: Internal Medicine

## 2015-10-07 NOTE — Progress Notes (Signed)
HISTORY AND PHYSICAL   DATE: 04/11/15  Location:  [facility]    Place of Service: SNF [id number])   Extended Emergency Contact Information Primary Emergency Contact: [place],Armina Address: [address]          [place], [place] [address] Johnnette Litter of [place] Phone: [phone] Mobile Phone: [phone] Relation: Grandaughter  Advanced Directive information  [place]  Chief Complaint  Patient presents with  . New Admit To SNF    HPI:  82 yo female seen today as a new admission into SNF following hospital stay for change in mental status, dementia and MDD. She was seen by neurology; had an EEG done. She was started on amantadine for possible underlying undiagnosed parkinson disease. She has a pacer due to SSS. She has no concerns. No nursing issues. No falls since admission. She is a poor historian due to dementia. Hx obtained from chart  Alzheimer's disease - stable on namenda xr 28 mg daily. weight is 117 pounds  MDD/insomnia - mood stable on zoloft 25 mg daily; remeron 7.5 mg nightly for rest and to help with appetite    Past Medical History  Diagnosis Date  . Dementia   . Pacemaker   . OA (osteoarthritis)     Past Surgical History  Procedure Laterality Date  . Knee arthroplasty Bilateral   . Carpal tunnel release Bilateral   . Brain surgery  2013  . Atrial cardiac pacemaker insertion  2012  . Flexible sigmoidoscopy N/A 09/12/2014    Procedure: FLEXIBLE SIGMOIDOSCOPY;  Surgeon: Jerene Bears, MD;  Location: [facility];  Service: Gastroenterology;  Laterality: N/A;    Patient Care Team: Lauree Chandler, NP as PCP - General (Nurse Practitioner)  Social History   Social History  . Marital Status: Widowed    Spouse Name: N/A  . Number of Children: 1  . Years of Education: N/A   Occupational History  . retired    Social History Main Topics  . Smoking status: Never Smoker   . Smokeless tobacco: Never Used  . Alcohol  Use: No  . Drug Use: No  . Sexual Activity: No   Other Topics Concern  . Not on file   Social History Narrative   Diet:   Do you drink/eat things with caffeine? No   Marital status:     Widowed                         What year were you married?   Do you live in a house, apartment, assisted living, condo, trailer, etc)? Home   Is it one or more stories? One   How many persons live in your home? Two   Do you have any pets in your home? No   Current or past profession:   Do you exercise?      No                                              Type & how often:   Do you have a living will? Yes   Do you have a DNR Form? Yes   Do you have a POA/HPOA forms? Yes     reports that she has never smoked. She has never used smokeless tobacco. She reports that she does not drink alcohol or use  illicit drugs.  Family History  Problem Relation Age of Onset  . Heart disease Son   . Cancer Son 60    on Liver  . Colon polyps Neg Hx   . Diabetes Neg Hx   . Kidney disease Neg Hx   . Heart attack Son   . Alzheimer's disease Mother   . Alzheimer's disease Father    Family Status  Relation Status Death Age  . Son Deceased   . Mother Deceased   . Father Deceased   . Sister Alive   . Sister Alive   . Sister Alive     Immunization History  Administered Date(s) Administered  . PPD Test 11/16/2014    Allergies  Allergen Reactions  . Ativan [Lorazepam] Other (See Comments)    Causes violence   . Benzodiazepines     Medications: Patient's Medications  New Prescriptions   No medications on file  Previous Medications   ACETAMINOPHEN (TYLENOL) 325 MG TABLET    Take 650 mg by mouth every 4 (four) hours as needed.   CHOLECALCIFEROL (VITAMIN D) 1000 UNITS TABLET    Take 1 tablet (1,000 Units total) by mouth daily.   MELATONIN 3 MG TABS    Take 3 mg by mouth.   SERTRALINE (ZOLOFT) 25 MG TABLET    Take 1 tablet (25 mg total) by mouth daily.  Modified Medications   No medications on file    Discontinued Medications   No medications on file    Review of Systems  Unable to perform ROS: Dementia    Filed Vitals:   04/11/15 1648  BP: 135/75  Pulse: 92  Temp: 97.7 F (36.5 C)  Weight: 117 lb (53.071 kg)  SpO2: 97%   Body mass index is 22.12 kg/(m^2).  Physical Exam  Constitutional: She appears well-developed.  Frail appearing in NAD  HENT:  Mouth/Throat: Oropharynx is clear and moist. No oropharyngeal exudate.  Eyes: Pupils are equal, round, and reactive to light. No scleral icterus.  Neck: Neck supple. Carotid bruit is not present. No tracheal deviation present.  Cardiovascular: Normal rate, regular rhythm, normal heart sounds and intact distal pulses.  Exam reveals no gallop and no friction rub.   No murmur heard. +1 pitting LE edema b/l. No calf TTP  Pulmonary/Chest: Effort normal and breath sounds normal. No stridor. No respiratory distress. She has no wheezes. She has no rales.  Palpable ACW pacer  Abdominal: Soft. Bowel sounds are normal. She exhibits no distension and no mass. There is no hepatomegaly. There is no tenderness. There is no rebound and no guarding.  Musculoskeletal: She exhibits edema.  Lymphadenopathy:    She has no cervical adenopathy.  Neurological: She is alert.  Skin: Skin is warm and dry. No rash noted.  Psychiatric: She has a normal mood and affect. Her behavior is normal.     Labs reviewed: Admission on 04/06/2015, Discharged on 04/09/2015  Component Date Value Ref Range Status  . Glucose-Capillary 04/06/2015 214* 65 - 99 mg/dL Final  . Comment 1 04/06/2015 Notify RN   Final  . Comment 2 04/06/2015 Document in Chart   Final  . WBC 04/06/2015 5.4  4.0 - 10.5 K/uL Final  . RBC 04/06/2015 3.73* 3.87 - 5.11 MIL/uL Final  . Hemoglobin 04/06/2015 11.4* 12.0 - 15.0 g/dL Final  . HCT 04/06/2015 35.9* 36.0 - 46.0 % Final  . MCV 04/06/2015 96.2  78.0 - 100.0 fL Final  . MCH 04/06/2015 30.6  26.0 - 34.0 pg Final  .  MCHC 04/06/2015 31.8   30.0 - 36.0 g/dL Final  . RDW 04/06/2015 14.9  11.5 - 15.5 % Final  . Platelets 04/06/2015 151  150 - 400 K/uL Final  . Neutrophils Relative % 04/06/2015 53   Final  . Neutro Abs 04/06/2015 2.8  1.7 - 7.7 K/uL Final  . Lymphocytes Relative 04/06/2015 35   Final  . Lymphs Abs 04/06/2015 1.9  0.7 - 4.0 K/uL Final  . Monocytes Relative 04/06/2015 9   Final  . Monocytes Absolute 04/06/2015 0.5  0.1 - 1.0 K/uL Final  . Eosinophils Relative 04/06/2015 3   Final  . Eosinophils Absolute 04/06/2015 0.2  0.0 - 0.7 K/uL Final  . Basophils Relative 04/06/2015 0   Final  . Basophils Absolute 04/06/2015 0.0  0.0 - 0.1 K/uL Final  . Sodium 04/06/2015 140  135 - 145 mmol/L Final  . Potassium 04/06/2015 4.3  3.5 - 5.1 mmol/L Final  . Chloride 04/06/2015 105  101 - 111 mmol/L Final  . CO2 04/06/2015 29  22 - 32 mmol/L Final  . Glucose, Bld 04/06/2015 218* 65 - 99 mg/dL Final  . BUN 04/06/2015 32* 6 - 20 mg/dL Final  . Creatinine, Ser 04/06/2015 1.02* 0.44 - 1.00 mg/dL Final  . Calcium 04/06/2015 9.0  8.9 - 10.3 mg/dL Final  . Total Protein 04/06/2015 7.1  6.5 - 8.1 g/dL Final  . Albumin 04/06/2015 3.5  3.5 - 5.0 g/dL Final  . AST 04/06/2015 24  15 - 41 U/L Final  . ALT 04/06/2015 17  14 - 54 U/L Final  . Alkaline Phosphatase 04/06/2015 74  38 - 126 U/L Final  . Total Bilirubin 04/06/2015 0.5  0.3 - 1.2 mg/dL Final  . GFR calc non Af Amer 04/06/2015 47* >60 mL/min Final  . GFR calc Af Amer 04/06/2015 54* >60 mL/min Final   Comment: (NOTE) The eGFR has been calculated using the CKD EPI equation. This calculation has not been validated in all clinical situations. eGFR's persistently <60 mL/min signify possible Chronic Kidney Disease.   . Anion gap 04/06/2015 6  5 - 15 Final  . Color, Urine 04/06/2015 AMBER* YELLOW Final   BIOCHEMICALS MAY BE AFFECTED BY COLOR  . APPearance 04/06/2015 CLOUDY* CLEAR Final  . Specific Gravity, Urine 04/06/2015 1.025  1.005 - 1.030 Final  . pH 04/06/2015 5.5  5.0 -  8.0 Final  . Glucose, UA 04/06/2015 NEGATIVE  NEGATIVE mg/dL Final  . Hgb urine dipstick 04/06/2015 NEGATIVE  NEGATIVE Final  . Bilirubin Urine 04/06/2015 SMALL* NEGATIVE Final  . Ketones, ur 04/06/2015 NEGATIVE  NEGATIVE mg/dL Final  . Protein, ur 04/06/2015 30* NEGATIVE mg/dL Final  . Nitrite 04/06/2015 NEGATIVE  NEGATIVE Final  . Leukocytes, UA 04/06/2015 NEGATIVE  NEGATIVE Final  . Ammonia 04/06/2015 24  9 - 35 umol/L Final  . Troponin I 04/06/2015 <0.03  <0.031 ng/mL Final   Comment:        NO INDICATION OF MYOCARDIAL INJURY.   . Prothrombin Time 04/06/2015 14.0  11.6 - 15.2 seconds Final  . INR 04/06/2015 1.06  0.00 - 1.49 Final  . aPTT 04/06/2015 26  24 - 37 seconds Final  . Lactic Acid, Venous 04/06/2015 3.20* 0.5 - 2.0 mmol/L Final  . Comment 04/06/2015 NOTIFIED PHYSICIAN   Final  . Squamous Epithelial / LPF 04/06/2015 0-5* NONE SEEN Final   Please note change in reference range.  . WBC, UA 04/06/2015 0-5  0 - 5 WBC/hpf Final   Please note change in  reference range.  . RBC / HPF 04/06/2015 NONE SEEN  0 - 5 RBC/hpf Final   Please note change in reference range.  . Bacteria, UA 04/06/2015 RARE* NONE SEEN Final   Please note change in reference range.  . Urine-Other 04/06/2015 MUCOUS PRESENT   Final  . WBC 04/06/2015 5.4  4.0 - 10.5 K/uL Final  . RBC 04/06/2015 3.54* 3.87 - 5.11 MIL/uL Final  . Hemoglobin 04/06/2015 10.7* 12.0 - 15.0 g/dL Final  . HCT 04/06/2015 34.2* 36.0 - 46.0 % Final  . MCV 04/06/2015 96.6  78.0 - 100.0 fL Final  . MCH 04/06/2015 30.2  26.0 - 34.0 pg Final  . MCHC 04/06/2015 31.3  30.0 - 36.0 g/dL Final  . RDW 04/06/2015 14.9  11.5 - 15.5 % Final  . Platelets 04/06/2015 145* 150 - 400 K/uL Final  . Creatinine, Ser 04/06/2015 0.91  0.44 - 1.00 mg/dL Final  . GFR calc non Af Amer 04/06/2015 54* >60 mL/min Final  . GFR calc Af Amer 04/06/2015 >60  >60 mL/min Final   Comment: (NOTE) The eGFR has been calculated using the CKD EPI equation. This  calculation has not been validated in all clinical situations. eGFR's persistently <60 mL/min signify possible Chronic Kidney Disease.   . Sodium 04/07/2015 141  135 - 145 mmol/L Final  . Potassium 04/07/2015 3.7  3.5 - 5.1 mmol/L Final  . Chloride 04/07/2015 106  101 - 111 mmol/L Final  . CO2 04/07/2015 28  22 - 32 mmol/L Final  . Glucose, Bld 04/07/2015 89  65 - 99 mg/dL Final  . BUN 04/07/2015 25* 6 - 20 mg/dL Final  . Creatinine, Ser 04/07/2015 0.70  0.44 - 1.00 mg/dL Final  . Calcium 04/07/2015 8.7* 8.9 - 10.3 mg/dL Final  . Total Protein 04/07/2015 6.8  6.5 - 8.1 g/dL Final  . Albumin 04/07/2015 3.1* 3.5 - 5.0 g/dL Final  . AST 04/07/2015 18  15 - 41 U/L Final  . ALT 04/07/2015 14  14 - 54 U/L Final  . Alkaline Phosphatase 04/07/2015 70  38 - 126 U/L Final  . Total Bilirubin 04/07/2015 0.5  0.3 - 1.2 mg/dL Final  . GFR calc non Af Amer 04/07/2015 >60  >60 mL/min Final  . GFR calc Af Amer 04/07/2015 >60  >60 mL/min Final   Comment: (NOTE) The eGFR has been calculated using the CKD EPI equation. This calculation has not been validated in all clinical situations. eGFR's persistently <60 mL/min signify possible Chronic Kidney Disease.   . Anion gap 04/07/2015 7  5 - 15 Final  . WBC 04/07/2015 5.7  4.0 - 10.5 K/uL Final  . RBC 04/07/2015 3.74* 3.87 - 5.11 MIL/uL Final  . Hemoglobin 04/07/2015 11.4* 12.0 - 15.0 g/dL Final  . HCT 04/07/2015 35.8* 36.0 - 46.0 % Final  . MCV 04/07/2015 95.7  78.0 - 100.0 fL Final  . MCH 04/07/2015 30.5  26.0 - 34.0 pg Final  . MCHC 04/07/2015 31.8  30.0 - 36.0 g/dL Final  . RDW 04/07/2015 15.1  11.5 - 15.5 % Final  . Platelets 04/07/2015 139* 150 - 400 K/uL Final  . Troponin I 04/06/2015 <0.03  <0.031 ng/mL Final   Comment:        NO INDICATION OF MYOCARDIAL INJURY.   . Troponin I 04/07/2015 <0.03  <0.031 ng/mL Final   Comment:        NO INDICATION OF MYOCARDIAL INJURY.   . Hgb A1c MFr Bld 04/06/2015 6.1* 4.8 - 5.6 %  Final   Comment:  (NOTE)         Pre-diabetes: 5.7 - 6.4         Diabetes: >6.4         Glycemic control for adults with diabetes: <7.0   . Mean Plasma Glucose 04/06/2015 128   Final   Comment: (NOTE) Performed At: [facility] [place], [place] [id number] Lindon Romp MD [id number]   . Iron 04/06/2015 33  28 - 170 ug/dL Final  . TIBC 04/06/2015 181* 250 - 450 ug/dL Final  . Saturation Ratios 04/06/2015 18  10.4 - 31.8 % Final  . UIBC 04/06/2015 148   Final  . Ferritin 04/06/2015 303  11 - 307 ng/mL Final  . Vitamin B-12 04/06/2015 373  180 - 914 pg/mL Final   Comment: (NOTE) This assay is not validated for testing neonatal or myeloproliferative syndrome specimens for Vitamin B12 levels.   . Folate 04/06/2015 11.5  >5.9 ng/mL Final  . Magnesium 04/06/2015 1.8  1.7 - 2.4 mg/dL Final  . Phosphorus 04/06/2015 3.2  2.5 - 4.6 mg/dL Final  . Lactic Acid, Venous 04/07/2015 1.3  0.5 - 2.0 mmol/L Final  . Glucose-Capillary 04/07/2015 91  65 - 99 mg/dL Final  . Glucose-Capillary 04/07/2015 93  65 - 99 mg/dL Final  . Ammonia 04/07/2015 32  9 - 35 umol/L Final  . Specimen Description 04/07/2015 URINE, RANDOM   Final  . Special Requests 04/07/2015 NONE   Final  . Culture 04/07/2015 MULTIPLE SPECIES PRESENT, SUGGEST RECOLLECTION   Final  . Report Status 04/07/2015 04/09/2015 FINAL   Final  . Glucose-Capillary 04/07/2015 120* 65 - 99 mg/dL Final  . Glucose-Capillary 04/07/2015 103* 65 - 99 mg/dL Final  . Comment 1 04/07/2015 Notify RN   Final  . Comment 2 04/07/2015 Document in Chart   Final  . Glucose-Capillary 04/08/2015 103* 65 - 99 mg/dL Final  . Glucose-Capillary 04/08/2015 90  65 - 99 mg/dL Final  . Glucose-Capillary 04/08/2015 124* 65 - 99 mg/dL Final  . Glucose-Capillary 04/08/2015 159* 65 - 99 mg/dL Final  . Glucose-Capillary 04/09/2015 103* 65 - 99 mg/dL Final  . Glucose-Capillary 04/09/2015 100* 65 - 99 mg/dL Final  . Glucose-Capillary 04/09/2015 126* 65 - 99 mg/dL  Final  Office Visit on 03/27/2015  Component Date Value Ref Range Status  . WBC 03/27/2015 4.4  3.4 - 10.8 x10E3/uL Final  . RBC 03/27/2015 3.55* 3.77 - 5.28 x10E6/uL Final  . Hemoglobin 03/27/2015 10.8* 11.1 - 15.9 g/dL Final  . Hematocrit 03/27/2015 33.0* 34.0 - 46.6 % Final  . MCV 03/27/2015 93  79 - 97 fL Final  . MCH 03/27/2015 30.4  26.6 - 33.0 pg Final  . MCHC 03/27/2015 32.7  31.5 - 35.7 g/dL Final  . RDW 03/27/2015 15.1  12.3 - 15.4 % Final  . Platelets 03/27/2015 198  150 - 379 x10E3/uL Final  . Neutrophils 03/27/2015 59   Final  . Lymphs 03/27/2015 28   Final  . Monocytes 03/27/2015 9   Final  . Eos 03/27/2015 3   Final  . Basos 03/27/2015 1   Final  . Neutrophils Absolute 03/27/2015 2.6  1.4 - 7.0 x10E3/uL Final  . Lymphocytes Absolute 03/27/2015 1.2  0.7 - 3.1 x10E3/uL Final  . Monocytes Absolute 03/27/2015 0.4  0.1 - 0.9 x10E3/uL Final  . EOS (ABSOLUTE) 03/27/2015 0.2  0.0 - 0.4 x10E3/uL Final  . Basophils Absolute 03/27/2015 0.0  0.0 - 0.2 x10E3/uL Final  .  Immature Granulocytes 03/27/2015 0   Final  . Immature Grans (Abs) 03/27/2015 0.0  0.0 - 0.1 x10E3/uL Final  . Glucose 03/27/2015 105* 65 - 99 mg/dL Final  . BUN 03/27/2015 25  10 - 36 mg/dL Final  . Creatinine, Ser 03/27/2015 0.67  0.57 - 1.00 mg/dL Final  . GFR calc non Af Amer 03/27/2015 77  >59 mL/min/1.73 Final  . GFR calc Af Amer 03/27/2015 89  >59 mL/min/1.73 Final  . BUN/Creatinine Ratio 03/27/2015 37* 11 - 26 Final  . Sodium 03/27/2015 142  136 - 144 mmol/L Final  . Potassium 03/27/2015 4.3  3.5 - 5.2 mmol/L Final  . Chloride 03/27/2015 101  97 - 106 mmol/L Final  . CO2 03/27/2015 25  18 - 29 mmol/L Final  . Calcium 03/27/2015 9.1  8.7 - 10.3 mg/dL Final  . Total Protein 03/27/2015 7.1  6.0 - 8.5 g/dL Final  . Albumin 03/27/2015 3.9  3.2 - 4.6 g/dL Final  . Globulin, Total 03/27/2015 3.2  1.5 - 4.5 g/dL Final  . Albumin/Globulin Ratio 03/27/2015 1.2  1.1 - 2.5 Final  . Bilirubin Total 03/27/2015 <0.2   0.0 - 1.2 mg/dL Final  . Alkaline Phosphatase 03/27/2015 90  39 - 117 IU/L Final  . AST 03/27/2015 22  0 - 40 IU/L Final  . ALT 03/27/2015 15  0 - 32 IU/L Final  . TSH 03/27/2015 1.740  0.450 - 4.500 uIU/mL Final    No results found.   Assessment/Plan   ICD-9-CM ICD-10-CM   1. Late onset Alzheimer's disease without behavioral disturbance 331.0 G30.1    294.10 F02.80   2. Severe episode of recurrent major depressive disorder, without psychotic features ([place]) 296.33 F33.2   3. Pacemaker V45.01 Z95.0    due to SSS  4. Arthritis 716.90 M19.90   5. Parkinsonian features 781.0 R25.9     Cont current meds as ordered  Labs pending  Nutritional supplements as indicated  PT/OT/ST as ordered  F/u with neurology as scheduled  GOAL: short term rehab with potential for long term care. Communicated with pt and nursing.  Will follow  Heinz Eckert S. Perlie Gold  [facility] [address] [place], [place] [address] [phone] Cell (Monday-Friday 8 AM - 5 PM) [phone] After 5 PM and follow prompts

## 2015-10-09 ENCOUNTER — Non-Acute Institutional Stay (SKILLED_NURSING_FACILITY): Payer: Medicare Other | Admitting: Adult Health

## 2015-10-09 ENCOUNTER — Encounter: Payer: Self-pay | Admitting: Adult Health

## 2015-10-09 DIAGNOSIS — D649 Anemia, unspecified: Secondary | ICD-10-CM

## 2015-10-09 DIAGNOSIS — R259 Unspecified abnormal involuntary movements: Secondary | ICD-10-CM

## 2015-10-09 DIAGNOSIS — G301 Alzheimer's disease with late onset: Secondary | ICD-10-CM | POA: Diagnosis not present

## 2015-10-09 DIAGNOSIS — I10 Essential (primary) hypertension: Secondary | ICD-10-CM | POA: Diagnosis not present

## 2015-10-09 DIAGNOSIS — R29818 Other symptoms and signs involving the nervous system: Secondary | ICD-10-CM

## 2015-10-09 DIAGNOSIS — F0280 Dementia in other diseases classified elsewhere without behavioral disturbance: Secondary | ICD-10-CM | POA: Diagnosis not present

## 2015-10-09 DIAGNOSIS — F329 Major depressive disorder, single episode, unspecified: Secondary | ICD-10-CM | POA: Diagnosis not present

## 2015-10-09 DIAGNOSIS — F0393 Unspecified dementia, unspecified severity, with mood disturbance: Secondary | ICD-10-CM

## 2015-10-09 NOTE — Progress Notes (Signed)
Patient ID: Sherri Leonard, female   DOB: 26-Dec-1920, 82 y.o.   MRN: [id number]   Location:  [facility] Room Number: 155-B Place of Service:  SNF (31)   CODE STATUS: DNR  Allergies  Allergen Reactions  . Ativan [Lorazepam] Other (See Comments)    Causes violence   . Benzodiazepines     Chief Complaint  Patient presents with  . Medical Management of Chronic Issues    Follow up    HPI:  She is a long term resident of this facility being seen for the management of her chronic illnesses.  Overall there is little change in her status. She tells me that she is feeling good. There are no nursing concerns at this time.    Past Medical History  Diagnosis Date  . Dementia   . Pacemaker   . OA (osteoarthritis)     Past Surgical History  Procedure Laterality Date  . Knee arthroplasty Bilateral   . Carpal tunnel release Bilateral   . Brain surgery  2013  . Atrial cardiac pacemaker insertion  2012  . Flexible sigmoidoscopy N/A 09/12/2014    Procedure: FLEXIBLE SIGMOIDOSCOPY;  Surgeon: Beverley Fiedler, MD;  Location: [facility];  Service: Gastroenterology;  Laterality: N/A;    Social History   Social History  . Marital Status: Widowed    Spouse Name: N/A  . Number of Children: 1  . Years of Education: N/A   Occupational History  . retired    Social History Main Topics  . Smoking status: Never Smoker   . Smokeless tobacco: Never Used  . Alcohol Use: No  . Drug Use: No  . Sexual Activity: No   Other Topics Concern  . Not on file   Social History Narrative   Diet:   Do you drink/eat things with caffeine? No   Marital status:     Widowed                         What year were you married?   Do you live in a house, apartment, assisted living, condo, trailer, etc)? Home   Is it one or more stories? One   How many persons live in your home? Two   Do you have any pets in your home? No   Current or past profession:   Do you exercise?       No                                              Type & how often:   Do you have a living will? Yes   Do you have a DNR Form? Yes   Do you have a POA/HPOA forms? Yes   Family History  Problem Relation Age of Onset  . Heart disease Son   . Cancer Son 60    on Liver  . Colon polyps Neg Hx   . Diabetes Neg Hx   . Kidney disease Neg Hx   . Heart attack Son   . Alzheimer's disease Mother   . Alzheimer's disease Father       VITAL SIGNS BP 132/59 mmHg  Pulse 72  Temp(Src) 97.9 F (36.6 C) (Oral)  Resp 18  Ht  (1.549 m)  Wt 136 lb (61.689 kg)  BMI 25.71 kg/m2  SpO2  96%  Patient's Medications  New Prescriptions   No medications on file  Previous Medications   ACETAMINOPHEN (TYLENOL) 325 MG TABLET    Take 650 mg by mouth every 4 (four) hours as needed.   CHOLECALCIFEROL (VITAMIN D) 1000 UNITS TABLET    Take 1 tablet (1,000 Units total) by mouth daily.   MELATONIN 3 MG TABS    Take 3 mg by mouth.   SERTRALINE (ZOLOFT) 25 MG TABLET    Take 1 tablet (25 mg total) by mouth daily.  Modified Medications   No medications on file  Discontinued Medications   No medications on file     SIGNIFICANT DIAGNOSTIC EXAMS  04-06-15: ct of head: No evidence of acute intracranial abnormality. Atrophy with small vessel ischemic changes  04-06-15: acute abdomen with chest x-ray: 1. No acute cardiopulmonary disease. 2. Normal bowel gas pattern. 3. Atherosclerosis.  04-07-15: EEG: Impression: This awake and asleep EEG is abnormal due to mild to moderate diffuse slowing of the waking background. Clinical Correlation of the above findings indicates diffuse cerebral dysfunction that is non-specific in etiology and can be seen with hypoxic/ischemic injury, toxic/metabolic encephalopathies, neurodegenerative disorders, or medication effect.  The absence of epileptiform discharges does not rule out a clinical diagnosis of epilepsy.  Clinical correlation is advised.   LABS REVIEWED:    03-27-15: tsh 1.740 04-06-15: wbc 5.4; hgb 11.4; hct 35.9; mcv 96.2; plt 151; glucose 218; bun 32; creaet 1.02; k+ 4.3; na++140; liver normal albumin 3.5; mag 1.8; phos 3.2; vit b12: 373; iron 33; tibc 181; ferritin 303; ammonia 24;  Folate 11.5 04-07-15: urine culture: multiple bacteria  04-17-15: wbc 6.6; hgb 11.9; hct 36.2; mcv 91.4; plt 250; glucose 83; bun 11; creat 0.70; k+ 4.9; na++138      Review of Systems  Constitutional: Negative for malaise/fatigue.  Respiratory: Negative for cough and shortness of breath.   Cardiovascular: Negative for chest pain and leg swelling.  Gastrointestinal: Negative for abdominal pain and constipation.  Musculoskeletal: Negative for myalgias and joint pain.  Skin: Negative.   Neurological: Negative for dizziness.  Psychiatric/Behavioral: The patient is not nervous/anxious.      Physical Exam  Constitutional: No distress.  Frail   Eyes: Conjunctivae are normal.  Neck: Neck supple. No JVD present. No thyromegaly present.  Cardiovascular: Normal rate, regular rhythm and intact distal pulses.   Respiratory: Effort normal and breath sounds normal. No respiratory distress. She has no wheezes.  GI: Soft. Bowel sounds are normal. She exhibits no distension. There is no tenderness.  Musculoskeletal: She exhibits no edema.  Able to move all extremities   Lymphadenopathy:    She has no cervical adenopathy.  Neurological: She is alert.  Skin: Skin is warm and dry. She is not diaphoretic.  Psychiatric: She has a normal mood and affect.       ASSESSMENT/ PLAN: .  1. Alzheimer's disease: no significant change in her status; is presently not on medications; will not make changes will monitor   2. Possible parkinson's disease: is presently not on medications; will not make changes will monitor her status.   3. Depression: will continue zoloft 25 mg daily. Her current weight is 135 pounds.   4. Anemia: her current hgb is 11.9; is presently not  taking medications.     Will check cbc; cmp   Synthia Innocenteborah Green NP [facility]  Contact [phone] Monday through Friday 8am- 5pm  After hours call [phone]

## 2015-10-11 LAB — BASIC METABOLIC PANEL
BUN: 11 mg/dL (ref 4–21)
Creatinine: 0.7 mg/dL (ref 0.5–1.1)
Glucose: 65 mg/dL
Potassium: 4.2 mmol/L (ref 3.4–5.3)
Sodium: 143 mmol/L (ref 137–147)

## 2015-10-11 LAB — CBC AND DIFFERENTIAL
HEMATOCRIT: 36 % (ref 36–46)
Hemoglobin: 11.8 g/dL — AB (ref 12.0–16.0)
PLATELETS: 242 10*3/uL (ref 150–399)
WBC: 6.1 10^3/mL

## 2015-10-11 LAB — HEPATIC FUNCTION PANEL
ALK PHOS: 99 U/L (ref 25–125)
ALT: 8 U/L (ref 7–35)
AST: 13 U/L (ref 13–35)
BILIRUBIN, TOTAL: 0.3 mg/dL

## 2015-11-14 ENCOUNTER — Non-Acute Institutional Stay (SKILLED_NURSING_FACILITY): Payer: Medicare Other | Admitting: Internal Medicine

## 2015-11-14 ENCOUNTER — Encounter: Payer: Self-pay | Admitting: Internal Medicine

## 2015-11-14 DIAGNOSIS — F0280 Dementia in other diseases classified elsewhere without behavioral disturbance: Secondary | ICD-10-CM

## 2015-11-14 DIAGNOSIS — I10 Essential (primary) hypertension: Secondary | ICD-10-CM

## 2015-11-14 DIAGNOSIS — F329 Major depressive disorder, single episode, unspecified: Secondary | ICD-10-CM | POA: Diagnosis not present

## 2015-11-14 DIAGNOSIS — F0393 Unspecified dementia, unspecified severity, with mood disturbance: Secondary | ICD-10-CM

## 2015-11-14 DIAGNOSIS — M1990 Unspecified osteoarthritis, unspecified site: Secondary | ICD-10-CM | POA: Diagnosis not present

## 2015-11-14 DIAGNOSIS — R259 Unspecified abnormal involuntary movements: Secondary | ICD-10-CM

## 2015-11-14 DIAGNOSIS — G301 Alzheimer's disease with late onset: Secondary | ICD-10-CM

## 2015-11-14 NOTE — Progress Notes (Signed)
DATE: 11/14/15  Location:    [facility] Room Number: 155 B Place of Service: SNF (31)   Extended Emergency Contact Information Primary Emergency Contact: [place],Armina Address: [address]          [place], [place] [address] [place] Home Phone: [phone] Mobile Phone: [phone] Relation: Grandaughter  Advanced Directive information Does patient have an advance directive?: Yes, Type of Advance Directive: Out of facility DNR (pink MOST or yellow form), Does patient want to make changes to advanced directive?: No - Patient declined  Chief Complaint  Patient presents with  . Medical Management of Chronic Issues    Routine Visit    HPI:  82 yo female long term resident seen today for f/u. She has no concerns. No nursing issues. No falls. No change in bowel/bladder habits. She is incontinent. She is a poor historian due to dementia. Hx obtained from chart  Alzheimer's disease -  no significant change in her status; is presently not on medications. Albumin 3.4  Possible parkinson's disease - is presently not on medications; will not make changes   Depression - mood stable on zoloft 50 mg daily. Her current weight is 138 pounds. She takes melatonin to help her sleep  Hx Anemia - stable. her current hgb is 11.8  SSS hx - s/p cardiac pacemaker  OA - pain controlled on prn tylenol  Past Medical History  Diagnosis Date  . Dementia   . Pacemaker   . OA (osteoarthritis)     Past Surgical History  Procedure Laterality Date  . Knee arthroplasty Bilateral   . Carpal tunnel release Bilateral   . Brain surgery  2013  . Atrial cardiac pacemaker insertion  2012  . Flexible sigmoidoscopy N/A 09/12/2014    Procedure: FLEXIBLE SIGMOIDOSCOPY;  Surgeon: Beverley Fiedler, MD;  Location: [facility];  Service: Gastroenterology;  Laterality: N/A;    Patient Care Team: Sharon Seller, NP as PCP - General (Nurse Practitioner)  Social History    Social History  . Marital Status: Widowed    Spouse Name: N/A  . Number of Children: 1  . Years of Education: N/A   Occupational History  . retired    Social History Main Topics  . Smoking status: Never Smoker   . Smokeless tobacco: Never Used  . Alcohol Use: No  . Drug Use: No  . Sexual Activity: No   Other Topics Concern  . Not on file   Social History Narrative   Diet:   Do you drink/eat things with caffeine? No   Marital status:     Widowed                         What year were you married?   Do you live in a house, apartment, assisted living, condo, trailer, etc)? Home   Is it one or more stories? One   How many persons live in your home? Two   Do you have any pets in your home? No   Current or past profession:   Do you exercise?      No                                              Type & how often:   Do you have a living will? Yes  Do you have a DNR Form? Yes   Do you have a POA/HPOA forms? Yes     reports that she has never smoked. She has never used smokeless tobacco. She reports that she does not drink alcohol or use illicit drugs.  Family History  Problem Relation Age of Onset  . Heart disease Son   . Cancer Son 60    on Liver  . Colon polyps Neg Hx   . Diabetes Neg Hx   . Kidney disease Neg Hx   . Heart attack Son   . Alzheimer's disease Mother   . Alzheimer's disease Father    Family Status  Relation Status Death Age  . Son Deceased   . Mother Deceased   . Father Deceased   . Sister Alive   . Sister Alive   . Sister Alive     Immunization History  Administered Date(s) Administered  . PPD Test 11/16/2014    Allergies  Allergen Reactions  . Ativan [Lorazepam] Other (See Comments)    Causes violence   . Benzodiazepines     Medications: Patient's Medications  New Prescriptions   No medications on file  Previous Medications   ACETAMINOPHEN (TYLENOL) 325 MG TABLET    Take 650 mg by mouth every 4 (four) hours as needed.    CHOLECALCIFEROL (VITAMIN D) 1000 UNITS TABLET    Take 1 tablet (1,000 Units total) by mouth daily.   MELATONIN 3 MG TABS    Take 3 mg by mouth.   POLYVINYL ALCOHOL (LIQUIFILM TEARS) 1.4 % OPHTHALMIC SOLUTION    Place 2 drops into both eyes 4 (four) times daily.   SERTRALINE (ZOLOFT) 50 MG TABLET    Take 50 mg by mouth daily.  Modified Medications   No medications on file  Discontinued Medications   SERTRALINE (ZOLOFT) 25 MG TABLET    Take 1 tablet (25 mg total) by mouth daily.    Review of Systems  Unable to perform ROS: Dementia    Filed Vitals:   11/14/15 1207  BP: 121/82  Pulse: 86  Temp: 98.6 F (37 C)  TempSrc: Oral  Resp: 20  Height: 5\' 1"  (1.549 m)  Weight: 138 lb 6.4 oz (62.778 kg)   Body mass index is 26.16 kg/(m^2).  Physical Exam  Constitutional: She appears well-developed.  Frail appearing in NAD  HENT:  Mouth/Throat: Oropharynx is clear and moist. No oropharyngeal exudate.  Eyes: Pupils are equal, round, and reactive to light. No scleral icterus.  Neck: Neck supple. Carotid bruit is not present. No tracheal deviation present.  Cardiovascular: Normal rate, regular rhythm and intact distal pulses.  Exam reveals no gallop and no friction rub.   Murmur (1/6 SEM) heard. No LE edema b/l. No calf TTP  Pulmonary/Chest: Effort normal and breath sounds normal. No stridor. No respiratory distress. She has no wheezes. She has no rales.  Palpable ACW pacer  Abdominal: Soft. Bowel sounds are normal. She exhibits no distension and no mass. There is no hepatomegaly. There is no tenderness. There is no rebound and no guarding.  Musculoskeletal: She exhibits edema.  Lymphadenopathy:    She has no cervical adenopathy.  Neurological: She is alert.  Skin: Skin is warm and dry. No rash noted.  Psychiatric: She has a normal mood and affect. Her behavior is normal.     Labs reviewed: Nursing Home on 11/14/2015  Component Date Value Ref Range Status  . Hemoglobin 10/11/2015  11.8* 12.0 - 16.0 g/dL Final  . HCT  10/11/2015 36  36 - 46 % Final  . Platelets 10/11/2015 242  150 - 399 K/L Final  . WBC 10/11/2015 6.1   Final  . Glucose 10/11/2015 65   Final  . BUN 10/11/2015 11  4 - 21 mg/dL Final  . Creatinine 16/10/960405/24/2017 0.7  0.5 - 1.1 mg/dL Final  . Potassium 54/09/811905/24/2017 4.2  3.4 - 5.3 mmol/L Final  . Sodium 10/11/2015 143  137 - 147 mmol/L Final  . Alkaline Phosphatase 10/11/2015 99  25 - 125 U/L Final  . ALT 10/11/2015 8  7 - 35 U/L Final  . AST 10/11/2015 13  13 - 35 U/L Final  . Bilirubin, Total 10/11/2015 0.3   Final    No results found.   Assessment/Plan   ICD-9-CM ICD-10-CM   1. Late onset Alzheimer's disease without behavioral disturbance 331.0 G30.1    294.10 F02.80   2. Essential hypertension, benign 401.1 I10   3. Depression due to dementia 311 F32.9    294.10 F02.80   4. Parkinsonian features 781.0 R25.9   5. Arthritis 716.90 M19.90    Cont nutritional supplements as ordered  Cont current meds as ordered  PT/OT/ST as indicated  Will follow  Oshua Mcconaha S. Ancil Linseyarter, D. O., F. A. C. O. I.  [facility] [address] [place], [place] [address] [phone] Cell (Monday-Friday 8 AM - 5 PM) [phone] After 5 PM and follow prompts

## 2015-12-21 ENCOUNTER — Encounter: Payer: Self-pay | Admitting: Adult Health

## 2015-12-21 ENCOUNTER — Non-Acute Institutional Stay (SKILLED_NURSING_FACILITY): Payer: Medicare Other | Admitting: Adult Health

## 2015-12-21 DIAGNOSIS — F0280 Dementia in other diseases classified elsewhere without behavioral disturbance: Secondary | ICD-10-CM | POA: Diagnosis not present

## 2015-12-21 DIAGNOSIS — F329 Major depressive disorder, single episode, unspecified: Secondary | ICD-10-CM | POA: Diagnosis not present

## 2015-12-21 DIAGNOSIS — D649 Anemia, unspecified: Secondary | ICD-10-CM | POA: Diagnosis not present

## 2015-12-21 DIAGNOSIS — G301 Alzheimer's disease with late onset: Secondary | ICD-10-CM | POA: Diagnosis not present

## 2015-12-21 DIAGNOSIS — R259 Unspecified abnormal involuntary movements: Secondary | ICD-10-CM | POA: Diagnosis not present

## 2015-12-21 DIAGNOSIS — F0393 Unspecified dementia, unspecified severity, with mood disturbance: Secondary | ICD-10-CM

## 2015-12-21 NOTE — Progress Notes (Signed)
Patient ID: Sherri Leonard, female   DOB: 04-11-1921, 82 y.o.   MRN: [id number]   Location:   [facility] Room Number: 101-A Place of Service:  SNF (31)   CODE STATUS: DNR  Allergies  Allergen Reactions  . Ativan [Lorazepam] Other (See Comments)    Causes violence   . Benzodiazepines     Chief Complaint  Patient presents with  . Medical Management of Chronic Issues    Follow up    HPI:  She is being seen for the management of her chronic illnesses. Overall her status is stable. She is unable to fully participate in the hpi or ros; but did tell me that she is feeling good. There are no nursing concerns at this time.   Past Medical History:  Diagnosis Date  . Dementia   . OA (osteoarthritis)   . Pacemaker     Past Surgical History:  Procedure Laterality Date  . ATRIAL CARDIAC PACEMAKER INSERTION  2012  . BRAIN SURGERY  2013  . CARPAL TUNNEL RELEASE Bilateral   . FLEXIBLE SIGMOIDOSCOPY N/A 09/12/2014   Procedure: FLEXIBLE SIGMOIDOSCOPY;  Surgeon: Beverley Fiedler, MD;  Location: [facility];  Service: Gastroenterology;  Laterality: N/A;  . KNEE ARTHROPLASTY Bilateral     Social History   Social History  . Marital status: Widowed    Spouse name: N/A  . Number of children: 1  . Years of education: N/A   Occupational History  . retired    Social History Main Topics  . Smoking status: Never Smoker  . Smokeless tobacco: Never Used  . Alcohol use No  . Drug use: No  . Sexual activity: No   Other Topics Concern  . Not on file   Social History Narrative   Diet:   Do you drink/eat things with caffeine? No   Marital status:     Widowed                         What year were you married?   Do you live in a house, apartment, assisted living, condo, trailer, etc)? Home   Is it one or more stories? One   How many persons live in your home? Two   Do you have any pets in your home? No   Current or past profession:   Do you exercise?      No                                               Type & how often:   Do you have a living will? Yes   Do you have a DNR Form? Yes   Do you have a POA/HPOA forms? Yes   Family History  Problem Relation Age of Onset  . Heart disease Son   . Cancer Son 60    on Liver  . Alzheimer's disease Mother   . Alzheimer's disease Father   . Heart attack Son   . Colon polyps Neg Hx   . Diabetes Neg Hx   . Kidney disease Neg Hx       VITAL SIGNS BP 132/74   Pulse 82   Temp 98.5 F (36.9 C) (Oral)   Resp 16   Ht 5\' 1"  (1.549 m)   Wt 140 lb 2 oz (63.6 kg)   SpO2  97%   BMI 26.48 kg/m   Patient's Medications  New Prescriptions   No medications on file  Previous Medications   ACETAMINOPHEN (TYLENOL) 325 MG TABLET    Take 650 mg by mouth every 4 (four) hours as needed.   CHOLECALCIFEROL (VITAMIN D) 1000 UNITS TABLET    Take 1 tablet (1,000 Units total) by mouth daily.   MELATONIN 3 MG TABS    Take 3 mg by mouth.   SERTRALINE (ZOLOFT) 50 MG TABLET    Take 50 mg by mouth daily.  Modified Medications   No medications on file  Discontinued Medications   POLYVINYL ALCOHOL (LIQUIFILM TEARS) 1.4 % OPHTHALMIC SOLUTION    Place 2 drops into both eyes 4 (four) times daily.     SIGNIFICANT DIAGNOSTIC EXAMS  04-06-15: ct of head: No evidence of acute intracranial abnormality. Atrophy with small vessel ischemic changes  04-06-15: acute abdomen with chest x-ray: 1. No acute cardiopulmonary disease. 2. Normal bowel gas pattern. 3. Atherosclerosis.  04-07-15: EEG: Impression: This awake and asleep EEG is abnormal due to mild to moderate diffuse slowing of the waking background. Clinical Correlation of the above findings indicates diffuse cerebral dysfunction that is non-specific in etiology and can be seen with hypoxic/ischemic injury, toxic/metabolic encephalopathies, neurodegenerative disorders, or medication effect.  The absence of epileptiform discharges does not rule out a clinical diagnosis of epilepsy.   Clinical correlation is advised.   LABS REVIEWED:   03-27-15: tsh 1.740 04-06-15: wbc 5.4; hgb 11.4; hct 35.9; mcv 96.2; plt 151; glucose 218; bun 32; creaet 1.02; k+ 4.3; na++140; liver normal albumin 3.5; mag 1.8; phos 3.2; vit b12: 373; iron 33; tibc 181; ferritin 303; ammonia 24;  Folate 11.5 04-07-15: urine culture: multiple bacteria  04-17-15: wbc 6.6; hgb 11.9; hct 36.2; mcv 91.4; plt 250; glucose 83; bun 11; creat 0.70; k+ 4.9; na++138 10-11-15: wbc 6.1; hgb 11.8; hct 36.1; mcv 81.9; plt 242; glucose 65; bun 11; creat 0.69; k+ 4.2; na++ 143; liver normal albumin 3.4   Review of Systems  Unable to perform ROS: Dementia     Physical Exam  Constitutional: No distress.  Frail   Eyes: Conjunctivae are normal.  Neck: Neck supple. No JVD present. No thyromegaly present.  Cardiovascular: Normal rate, regular rhythm and intact distal pulses.   Respiratory: Effort normal and breath sounds normal. No respiratory distress. She has no wheezes.  GI: Soft. Bowel sounds are normal. She exhibits no distension. There is no tenderness.  Musculoskeletal: She exhibits no edema.  Able to move all extremities   Lymphadenopathy:    She has no cervical adenopathy.  Neurological: She is alert.  Skin: Skin is warm and dry. She is not diaphoretic.  Psychiatric: She has a normal mood and affect.       ASSESSMENT/ PLAN: .  1. Alzheimer's disease: no significant change in her status; is presently not on medications; will not make changes will monitor  Her weight is stable   2. Possible parkinson's disease: is presently not on medications; will not make changes will monitor her status.   3. Depression: will continue zoloft 50 mg daily. Her current weight is 140 pounds.   4. Anemia: her current hgb is 11.9; is presently not taking medications.    Synthia Innocent NP [facility]  Contact [phone] Monday through Friday 8am- 5pm  After hours call [phone]

## 2016-01-25 ENCOUNTER — Non-Acute Institutional Stay (SKILLED_NURSING_FACILITY): Payer: Medicare Other | Admitting: Adult Health

## 2016-01-25 ENCOUNTER — Encounter: Payer: Self-pay | Admitting: Adult Health

## 2016-01-25 DIAGNOSIS — F0280 Dementia in other diseases classified elsewhere without behavioral disturbance: Secondary | ICD-10-CM | POA: Diagnosis not present

## 2016-01-25 DIAGNOSIS — I495 Sick sinus syndrome: Secondary | ICD-10-CM

## 2016-01-25 DIAGNOSIS — G301 Alzheimer's disease with late onset: Secondary | ICD-10-CM

## 2016-01-25 DIAGNOSIS — F329 Major depressive disorder, single episode, unspecified: Secondary | ICD-10-CM | POA: Diagnosis not present

## 2016-01-25 DIAGNOSIS — F0393 Unspecified dementia, unspecified severity, with mood disturbance: Secondary | ICD-10-CM

## 2016-01-25 DIAGNOSIS — R259 Unspecified abnormal involuntary movements: Secondary | ICD-10-CM

## 2016-01-25 DIAGNOSIS — I10 Essential (primary) hypertension: Secondary | ICD-10-CM | POA: Diagnosis not present

## 2016-01-25 DIAGNOSIS — D649 Anemia, unspecified: Secondary | ICD-10-CM

## 2016-01-25 NOTE — Progress Notes (Signed)
Patient ID: Sherri Leonard, female   DOB: 1921/05/10, 82 y.o.   MRN: [id number]    Location:   [facility] Room Number: 101-A Place of Service:  SNF (31)   CODE STATUS: DNR  Allergies  Allergen Reactions  . Ativan [Lorazepam] Other (See Comments)    Causes violence   . Benzodiazepines     Chief Complaint  Patient presents with  . Medical Management of Chronic Issues    Follow up    HPI:  She is a long term resident of this facility being seen for the management of her chronic illnesses. Overall her status is stable. She is unable to fully participate in the hpi or ros; but tells me that she is feeling good. There are no nursing concerns at this time.    Past Medical History:  Diagnosis Date  . Dementia   . OA (osteoarthritis)   . Pacemaker     Past Surgical History:  Procedure Laterality Date  . ATRIAL CARDIAC PACEMAKER INSERTION  2012  . BRAIN SURGERY  2013  . CARPAL TUNNEL RELEASE Bilateral   . FLEXIBLE SIGMOIDOSCOPY N/A 09/12/2014   Procedure: FLEXIBLE SIGMOIDOSCOPY;  Surgeon: Beverley Fiedler, MD;  Location: [facility];  Service: Gastroenterology;  Laterality: N/A;  . KNEE ARTHROPLASTY Bilateral     Social History   Social History  . Marital status: Widowed    Spouse name: N/A  . Number of children: 1  . Years of education: N/A   Occupational History  . retired    Social History Main Topics  . Smoking status: Never Smoker  . Smokeless tobacco: Never Used  . Alcohol use No  . Drug use: No  . Sexual activity: No   Other Topics Concern  . Not on file   Social History Narrative   Diet:   Do you drink/eat things with caffeine? No   Marital status:     Widowed                         What year were you married?   Do you live in a house, apartment, assisted living, condo, trailer, etc)? Home   Is it one or more stories? One   How many persons live in your home? Two   Do you have any pets in your home? No   Current or past profession:   Do you exercise?      No                                              Type & how often:   Do you have a living will? Yes   Do you have a DNR Form? Yes   Do you have a POA/HPOA forms? Yes   Family History  Problem Relation Age of Onset  . Heart disease Son   . Cancer Son 60    on Liver  . Alzheimer's disease Mother   . Alzheimer's disease Father   . Heart attack Son   . Colon polyps Neg Hx   . Diabetes Neg Hx   . Kidney disease Neg Hx       VITAL SIGNS BP (!) 142/56   Pulse 90   Temp 97.2 F (36.2 C) (Oral)   Resp 18   Ht 5\' 1"  (1.549 m)   Wt 138 lb  6 oz (62.8 kg)   SpO2 97%   BMI 26.15 kg/m   Patient's Medications  New Prescriptions   No medications on file  Previous Medications   ACETAMINOPHEN (TYLENOL) 325 MG TABLET    Take 650 mg by mouth every 4 (four) hours as needed.   CHOLECALCIFEROL (VITAMIN D) 1000 UNITS TABLET    Take 1 tablet (1,000 Units total) by mouth daily.   MELATONIN 3 MG TABS    Take 3 mg by mouth.   SERTRALINE (ZOLOFT) 100 MG TABLET    Take 100 mg by mouth daily.  Modified Medications   No medications on file  Discontinued Medications   SERTRALINE (ZOLOFT) 50 MG TABLET    Take 50 mg by mouth daily.     SIGNIFICANT DIAGNOSTIC EXAMS  04-06-15: ct of head: No evidence of acute intracranial abnormality. Atrophy with small vessel ischemic changes  04-06-15: acute abdomen with chest x-ray: 1. No acute cardiopulmonary disease. 2. Normal bowel gas pattern. 3. Atherosclerosis.  04-07-15: EEG: Impression: This awake and asleep EEG is abnormal due to mild to moderate diffuse slowing of the waking background. Clinical Correlation of the above findings indicates diffuse cerebral dysfunction that is non-specific in etiology and can be seen with hypoxic/ischemic injury, toxic/metabolic encephalopathies, neurodegenerative disorders, or medication effect.  The absence of epileptiform discharges does not rule out a clinical diagnosis of epilepsy.  Clinical  correlation is advised.   LABS REVIEWED:   03-27-15: tsh 1.740 04-06-15: wbc 5.4; hgb 11.4; hct 35.9; mcv 96.2; plt 151; glucose 218; bun 32; creaet 1.02; k+ 4.3; na++140; liver normal albumin 3.5; mag 1.8; phos 3.2; vit b12: 373; iron 33; tibc 181; ferritin 303; ammonia 24;  Folate 11.5 04-07-15: urine culture: multiple bacteria  04-17-15: wbc 6.6; hgb 11.9; hct 36.2; mcv 91.4; plt 250; glucose 83; bun 11; creat 0.70; k+ 4.9; na++138 10-11-15: wbc 6.1; hgb 11.8; hct 36.1; mcv 81.9; plt 242; glucose 65; bun 11; creat 0.69; k+ 4.2; na++ 143; liver normal albumin 3.4   Review of Systems  Unable to perform ROS: Dementia     Physical Exam  Constitutional: No distress.  Frail   Eyes: Conjunctivae are normal.  Neck: Neck supple. No JVD present. No thyromegaly present.  Cardiovascular: Normal rate, regular rhythm and intact distal pulses.   Respiratory: Effort normal and breath sounds normal. No respiratory distress. She has no wheezes.  GI: Soft. Bowel sounds are normal. She exhibits no distension. There is no tenderness.  Musculoskeletal: She exhibits no edema.  Able to move all extremities   Lymphadenopathy:    She has no cervical adenopathy.  Neurological: She is alert.  Skin: Skin is warm and dry. She is not diaphoretic.  Psychiatric: She has a normal mood and affect.       ASSESSMENT/ PLAN: .  1. Alzheimer's disease: no significant change in her status; is presently not on medications; will not make changes will monitor  Her weight is stable   2. Possible parkinson's disease: is presently not on medications; will not make changes will monitor her status.   3. Depression: will continue zoloft 100 mg daily. Her current weight is 138 pounds this is a 2 pound reduction from last month.   4. Anemia: her current hgb is 11.9; is presently not taking medications.   5. Hypertension: is not on medications; will monitor   6. Sick sinus syndrome: is status post pacer maker  insertion; will monitor     Synthia Innocenteborah Green NP [facility]  Contact [phone] Monday through Friday 8am- 5pm  After hours call [phone]

## 2016-02-19 ENCOUNTER — Non-Acute Institutional Stay (SKILLED_NURSING_FACILITY): Payer: Medicare Other | Admitting: Adult Health

## 2016-02-19 ENCOUNTER — Encounter: Payer: Self-pay | Admitting: Adult Health

## 2016-02-19 DIAGNOSIS — R259 Unspecified abnormal involuntary movements: Secondary | ICD-10-CM | POA: Diagnosis not present

## 2016-02-19 DIAGNOSIS — I495 Sick sinus syndrome: Secondary | ICD-10-CM | POA: Diagnosis not present

## 2016-02-19 DIAGNOSIS — D649 Anemia, unspecified: Secondary | ICD-10-CM | POA: Diagnosis not present

## 2016-02-19 DIAGNOSIS — G301 Alzheimer's disease with late onset: Secondary | ICD-10-CM | POA: Diagnosis not present

## 2016-02-19 DIAGNOSIS — I10 Essential (primary) hypertension: Secondary | ICD-10-CM

## 2016-02-19 DIAGNOSIS — F329 Major depressive disorder, single episode, unspecified: Secondary | ICD-10-CM | POA: Diagnosis not present

## 2016-02-19 DIAGNOSIS — F0393 Unspecified dementia, unspecified severity, with mood disturbance: Secondary | ICD-10-CM

## 2016-02-19 DIAGNOSIS — F0280 Dementia in other diseases classified elsewhere without behavioral disturbance: Secondary | ICD-10-CM

## 2016-02-19 NOTE — Progress Notes (Signed)
Patient ID: Sherri Leonard, female   DOB: July 03, 1920, 82 y.o.   MRN: [id number]   Location:   [facility] Room Number: 101-A Place of Service:  SNF (31)   CODE STATUS: DNR Patient ID: Sherri Leonard, female   DOB: 10/28/1920, 82 y.o.   MRN: [id number]    Location:   [facility] Room Number: 101-A Place of Service:  SNF (31)   CODE STATUS: DNR  Allergies  Allergen Reactions  . Ativan [Lorazepam] Other (See Comments)    Causes violence   . Benzodiazepines     Chief Complaint  Patient presents with  . Medical Management of Chronic Issues    Follow up    HPI:  She is a long term resident of this facility being seen for the management of her chronic illnesses.  Her status remains stable; her current weight is 137 pounds. She is unable to fully participate in the hpi or ros. She does get out of bed daily and wheels around the facility. There are no nursing concerns at this time.    Past Medical History:  Diagnosis Date  . Dementia   . OA (osteoarthritis)   . Pacemaker     Past Surgical History:  Procedure Laterality Date  . ATRIAL CARDIAC PACEMAKER INSERTION  2012  . BRAIN SURGERY  2013  . CARPAL TUNNEL RELEASE Bilateral   . FLEXIBLE SIGMOIDOSCOPY N/A 09/12/2014   Procedure: FLEXIBLE SIGMOIDOSCOPY;  Surgeon: Beverley Fiedler, MD;  Location: [facility];  Service: Gastroenterology;  Laterality: N/A;  . KNEE ARTHROPLASTY Bilateral     Social History   Social History  . Marital status: Widowed    Spouse name: N/A  . Number of children: 1  . Years of education: N/A   Occupational History  . retired    Social History Main Topics  . Smoking status: Never Smoker  . Smokeless tobacco: Never Used  . Alcohol use No  . Drug use: No  . Sexual activity: No   Other Topics Concern  . Not on file   Social History Narrative   Diet:   Do you drink/eat things with caffeine? No   Marital status:     Widowed                         What year  were you married?   Do you live in a house, apartment, assisted living, condo, trailer, etc)? Home   Is it one or more stories? One   How many persons live in your home? Two   Do you have any pets in your home? No   Current or past profession:   Do you exercise?      No                                              Type & how often:   Do you have a living will? Yes   Do you have a DNR Form? Yes   Do you have a POA/HPOA forms? Yes   Family History  Problem Relation Age of Onset  . Heart disease Son   . Cancer Son 60    on Liver  . Alzheimer's disease Mother   . Alzheimer's disease Father   . Heart attack Son   . Colon polyps Neg Hx   .  Diabetes Neg Hx   . Kidney disease Neg Hx       VITAL SIGNS BP (!) 153/86   Pulse (!) 121   Temp 98.8 F (37.1 C) (Oral)   Resp 20   Ht 5\' 1"  (1.549 m)   Wt 137 lb 8 oz (62.4 kg)   BMI 25.98 kg/m   Patient's Medications  New Prescriptions   No medications on file  Previous Medications   ACETAMINOPHEN (TYLENOL) 325 MG TABLET    Take 650 mg by mouth every 4 (four) hours as needed.   CHOLECALCIFEROL (VITAMIN D) 1000 UNITS TABLET    Take 1 tablet (1,000 Units total) by mouth daily.   MELATONIN 3 MG TABS    Take 3 mg by mouth.   QUETIAPINE (SEROQUEL) 25 MG TABLET    Take 25 mg by mouth at bedtime. Give one tablet along with 100 mg to equal 125 mg   SERTRALINE (ZOLOFT) 100 MG TABLET    Take 100 mg by mouth daily.  Modified Medications   No medications on file  Discontinued Medications   No medications on file     SIGNIFICANT DIAGNOSTIC EXAMS  04-06-15: ct of head: No evidence of acute intracranial abnormality. Atrophy with small vessel ischemic changes  04-06-15: acute abdomen with chest x-ray: 1. No acute cardiopulmonary disease. 2. Normal bowel gas pattern. 3. Atherosclerosis.  04-07-15: EEG: Impression: This awake and asleep EEG is abnormal due to mild to moderate diffuse slowing of the waking background. Clinical Correlation of  the above findings indicates diffuse cerebral dysfunction that is non-specific in etiology and can be seen with hypoxic/ischemic injury, toxic/metabolic encephalopathies, neurodegenerative disorders, or medication effect.  The absence of epileptiform discharges does not rule out a clinical diagnosis of epilepsy.  Clinical correlation is advised.   LABS REVIEWED:   03-27-15: tsh 1.740 04-06-15: wbc 5.4; hgb 11.4; hct 35.9; mcv 96.2; plt 151; glucose 218; bun 32; creaet 1.02; k+ 4.3; na++140; liver normal albumin 3.5; mag 1.8; phos 3.2; vit b12: 373; iron 33; tibc 181; ferritin 303; ammonia 24;  Folate 11.5 04-07-15: urine culture: multiple bacteria  04-17-15: wbc 6.6; hgb 11.9; hct 36.2; mcv 91.4; plt 250; glucose 83; bun 11; creat 0.70; k+ 4.9; na++138 10-11-15: wbc 6.1; hgb 11.8; hct 36.1; mcv 81.9; plt 242; glucose 65; bun 11; creat 0.69; k+ 4.2; na++ 143; liver normal albumin 3.4   Review of Systems  Unable to perform ROS: Dementia     Physical Exam  Constitutional: No distress.  Frail   Eyes: Conjunctivae are normal.  Neck: Neck supple. No JVD present. No thyromegaly present.  Cardiovascular: Normal rate, regular rhythm and intact distal pulses.   Respiratory: Effort normal and breath sounds normal. No respiratory distress. She has no wheezes.  GI: Soft. Bowel sounds are normal. She exhibits no distension. There is no tenderness.  Musculoskeletal: She exhibits no edema.  Able to move all extremities   Lymphadenopathy:    She has no cervical adenopathy.  Neurological: She is alert.  Skin: Skin is warm and dry. She is not diaphoretic.  Psychiatric: She has a normal mood and affect.       ASSESSMENT/ PLAN: .  1. Alzheimer's disease: no significant change in her status; is presently not on medications; will not make changes will monitor  Her weight is stable  At 137 pounds.   2. Possible parkinson's disease: is presently not on medications; will not make changes will monitor  her status.   3. Depression:  will continue zoloft 100 mg daily. Her current weight is 137  pounds this is a 1 pound reduction from last month.   4. Anemia: her current hgb is 11.9; is presently not taking medications.   5. Hypertension: is not on medications; will monitor   6. Sick sinus syndrome: is status post pacer maker insertion; will monitor     Synthia Innocenteborah Somalia Segler NP [facility]  Contact [phone] Monday through Friday 8am- 5pm  After hours call [phone]

## 2016-03-04 ENCOUNTER — Non-Acute Institutional Stay (SKILLED_NURSING_FACILITY): Payer: Medicare Other | Admitting: Adult Health

## 2016-03-04 ENCOUNTER — Encounter: Payer: Self-pay | Admitting: Adult Health

## 2016-03-04 DIAGNOSIS — M1990 Unspecified osteoarthritis, unspecified site: Secondary | ICD-10-CM | POA: Diagnosis not present

## 2016-03-04 NOTE — Progress Notes (Signed)
Patient ID: Sherri Leonard, female   DOB: 06/14/1920, 82 y.o.   MRN: [id number]   [facility]   [facility] Room Number: 101-A Place of Service:      CODE STATUS: DNR  Allergies  Allergen Reactions  . Ativan [Lorazepam] Other (See Comments)    Causes violence   . Benzodiazepines     Chief Complaint  Patient presents with  . Acute Visit    Left thumb    HPI:  Her left thumb is red hot swollen and painful to touch. There are no reports of fevers; no reports of trauma present. She does not have a history of gout. She does have osteoarthritis.    Past Medical History:  Diagnosis Date  . Dementia   . OA (osteoarthritis)   . Pacemaker     Past Surgical History:  Procedure Laterality Date  . ATRIAL CARDIAC PACEMAKER INSERTION  2012  . BRAIN SURGERY  2013  . CARPAL TUNNEL RELEASE Bilateral   . FLEXIBLE SIGMOIDOSCOPY N/A 09/12/2014   Procedure: FLEXIBLE SIGMOIDOSCOPY;  Surgeon: Beverley FiedlerJay M Pyrtle, MD;  Location: [facility];  Service: Gastroenterology;  Laterality: N/A;  . KNEE ARTHROPLASTY Bilateral     Social History   Social History  . Marital status: Widowed    Spouse name: N/A  . Number of children: 1  . Years of education: N/A   Occupational History  . retired    Social History Main Topics  . Smoking status: Never Smoker  . Smokeless tobacco: Never Used  . Alcohol use No  . Drug use: No  . Sexual activity: No   Other Topics Concern  . Not on file   Social History Narrative   Diet:   Do you drink/eat things with caffeine? No   Marital status:     Widowed                         What year were you married?   Do you live in a house, apartment, assisted living, condo, trailer, etc)? Home   Is it one or more stories? One   How many persons live in your home? Two   Do you have any pets in your home? No   Current or past profession:   Do you exercise?      No                                              Type & how often:   Do you have a living  will? Yes   Do you have a DNR Form? Yes   Do you have a POA/HPOA forms? Yes   Family History  Problem Relation Age of Onset  . Heart disease Son   . Cancer Son 60    on Liver  . Alzheimer's disease Mother   . Alzheimer's disease Father   . Heart attack Son   . Colon polyps Neg Hx   . Diabetes Neg Hx   . Kidney disease Neg Hx       VITAL SIGNS BP (!) 108/58   Pulse 80   Temp 97.9 F (36.6 C) (Oral)   Resp 18   Ht 5\' 1"  (1.549 m)   Wt 137 lb 8 oz (62.4 kg)   SpO2 97%   BMI 25.98 kg/m   Patient's Medications  New Prescriptions   No medications on file  Previous Medications   ACETAMINOPHEN (TYLENOL) 325 MG TABLET    Take 650 mg by mouth every 4 (four) hours as needed.   CHOLECALCIFEROL (VITAMIN D) 1000 UNITS TABLET    Take 1 tablet (1,000 Units total) by mouth daily.   MELATONIN 3 MG TABS    Take 3 mg by mouth.   QUETIAPINE (SEROQUEL) 25 MG TABLET    Take 25 mg by mouth at bedtime. Give one tablet along with 100 mg to equal 125 mg   SERTRALINE (ZOLOFT) 100 MG TABLET    Take 100 mg by mouth daily.  Modified Medications   No medications on file  Discontinued Medications   No medications on file     SIGNIFICANT DIAGNOSTIC EXAMS  04-06-15: ct of head: No evidence of acute intracranial abnormality. Atrophy with small vessel ischemic changes  04-06-15: acute abdomen with chest x-ray: 1. No acute cardiopulmonary disease. 2. Normal bowel gas pattern. 3. Atherosclerosis.  04-07-15: EEG: Impression: This awake and asleep EEG is abnormal due to mild to moderate diffuse slowing of the waking background. Clinical Correlation of the above findings indicates diffuse cerebral dysfunction that is non-specific in etiology and can be seen with hypoxic/ischemic injury, toxic/metabolic encephalopathies, neurodegenerative disorders, or medication effect.  The absence of epileptiform discharges does not rule out a clinical diagnosis of epilepsy.  Clinical correlation is  advised.   LABS REVIEWED:   03-27-15: tsh 1.740 04-06-15: wbc 5.4; hgb 11.4; hct 35.9; mcv 96.2; plt 151; glucose 218; bun 32; creaet 1.02; k+ 4.3; na++140; liver normal albumin 3.5; mag 1.8; phos 3.2; vit b12: 373; iron 33; tibc 181; ferritin 303; ammonia 24;  Folate 11.5 04-07-15: urine culture: multiple bacteria  04-17-15: wbc 6.6; hgb 11.9; hct 36.2; mcv 91.4; plt 250; glucose 83; bun 11; creat 0.70; k+ 4.9; na++138 10-11-15: wbc 6.1; hgb 11.8; hct 36.1; mcv 81.9; plt 242; glucose 65; bun 11; creat 0.69; k+ 4.2; na++ 143; liver normal albumin 3.4   Review of Systems  Unable to perform ROS: Dementia     Physical Exam  Constitutional: No distress.  Frail   Eyes: Conjunctivae are normal.  Neck: Neck supple. No JVD present. No thyromegaly present.  Cardiovascular: Normal rate, regular rhythm and intact distal pulses.   Respiratory: Effort normal and breath sounds normal. No respiratory distress. She has no wheezes.  GI: Soft. Bowel sounds are normal. She exhibits no distension. There is no tenderness.  Musculoskeletal: She exhibits no edema.  Able to move all extremities Left thumb is warm red swollen and painful; able to some passive range of motion   Lymphadenopathy:    She has no cervical adenopathy.  Neurological: She is alert.  Skin: Skin is warm and dry. She is not diaphoretic.  Psychiatric: She has a normal mood and affect.      ASSESSMENT/ PLAN: .   1. Arthritis left thumb: will begin aleve twice daily will get an x-ray to evaluate for trauma; will check uric acid level cbc; cmp; sed rate    Synthia Innocent NP [facility]  Contact [phone] Monday through Friday 8am- 5pm  After hours call [phone]

## 2016-03-05 LAB — POCT ERYTHROCYTE SEDIMENTATION RATE, NON-AUTOMATED: SED RATE: 85 mm

## 2016-03-05 LAB — BASIC METABOLIC PANEL
BUN: 26 mg/dL — AB (ref 4–21)
CREATININE: 0.7 mg/dL (ref 0.5–1.1)
GLUCOSE: 101 mg/dL
POTASSIUM: 4.1 mmol/L (ref 3.4–5.3)
SODIUM: 140 mmol/L (ref 137–147)

## 2016-03-05 LAB — HEPATIC FUNCTION PANEL
ALT: 7 U/L (ref 7–35)
AST: 15 U/L (ref 13–35)
Alkaline Phosphatase: 69 U/L (ref 25–125)
Bilirubin, Total: 0.4 mg/dL

## 2016-03-05 LAB — CBC AND DIFFERENTIAL
HEMATOCRIT: 35 % — AB (ref 36–46)
Hemoglobin: 10.8 g/dL — AB (ref 12.0–16.0)
Platelets: 192 10*3/uL (ref 150–399)
WBC: 7.6 10^3/mL

## 2016-03-14 ENCOUNTER — Non-Acute Institutional Stay (SKILLED_NURSING_FACILITY): Payer: Medicare Other | Admitting: Adult Health

## 2016-03-14 ENCOUNTER — Encounter: Payer: Self-pay | Admitting: Adult Health

## 2016-03-14 DIAGNOSIS — F329 Major depressive disorder, single episode, unspecified: Secondary | ICD-10-CM | POA: Diagnosis not present

## 2016-03-14 DIAGNOSIS — F0280 Dementia in other diseases classified elsewhere without behavioral disturbance: Secondary | ICD-10-CM

## 2016-03-14 DIAGNOSIS — G301 Alzheimer's disease with late onset: Secondary | ICD-10-CM | POA: Diagnosis not present

## 2016-03-14 DIAGNOSIS — F0393 Unspecified dementia, unspecified severity, with mood disturbance: Secondary | ICD-10-CM

## 2016-03-14 NOTE — Progress Notes (Signed)
Patient ID: Sherri Leonard, female   DOB: 01/21/1921, 82 y.o.   MRN: [id number]   Location[facility]   [facility] Room Number: 101-A Place of Service:  SNF (31)   CODE STATUS: DNR  Allergies  Allergen Reactions  . Ativan [Lorazepam] Other (See Comments)    Causes violence   . Benzodiazepines     Chief Complaint  Patient presents with  . Acute Visit    Acute    HPI:  Her daughter is wanting to review her medications. She tells me that her mother has had episodes of laughing and crying without meaning. She understands that behavior changes are known to happen with Alzheimer's disease. She is wanting her mother off the antipsychotic medication. She is ok with her being on an antidepressant.    Past Medical History:  Diagnosis Date  . Dementia   . OA (osteoarthritis)   . Pacemaker     Past Surgical History:  Procedure Laterality Date  . ATRIAL CARDIAC PACEMAKER INSERTION  2012  . BRAIN SURGERY  2013  . CARPAL TUNNEL RELEASE Bilateral   . FLEXIBLE SIGMOIDOSCOPY N/A 09/12/2014   Procedure: FLEXIBLE SIGMOIDOSCOPY;  Surgeon: Beverley FiedlerJay M Pyrtle, MD;  Location: [facility];  Service: Gastroenterology;  Laterality: N/A;  . KNEE ARTHROPLASTY Bilateral     Social History   Social History  . Marital status: Widowed    Spouse name: N/A  . Number of children: 1  . Years of education: N/A   Occupational History  . retired    Social History Main Topics  . Smoking status: Never Smoker  . Smokeless tobacco: Never Used  . Alcohol use No  . Drug use: No  . Sexual activity: No   Other Topics Concern  . Not on file   Social History Narrative   Diet:   Do you drink/eat things with caffeine? No   Marital status:     Widowed                         What year were you married?   Do you live in a house, apartment, assisted living, condo, trailer, etc)? Home   Is it one or more stories? One   How many persons live in your home? Two   Do you have any pets in your home? No   Current or past profession:   Do you exercise?      No                                              Type & how often:   Do you have a living will? Yes   Do you have a DNR Form? Yes   Do you have a POA/HPOA forms? Yes   Family History  Problem Relation Age of Onset  . Heart disease Son   . Cancer Son 60    on Liver  . Alzheimer's disease Mother   . Alzheimer's disease Father   . Heart attack Son   . Colon polyps Neg Hx   . Diabetes Neg Hx   . Kidney disease Neg Hx       VITAL SIGNS BP (!) 164/92   Pulse 93   Temp 97 F (36.1 C) (Oral)   Resp 18   Ht 5\' 1"  (1.549 m)   Wt 137 lb 8  oz (62.4 kg)   SpO2 98%   BMI 25.98 kg/m   Patient's Medications  New Prescriptions   No medications on file  Previous Medications   ACETAMINOPHEN (TYLENOL) 325 MG TABLET    Take 650 mg by mouth every 4 (four) hours as needed.   CHOLECALCIFEROL (VITAMIN D) 1000 UNITS TABLET    Take 1 tablet (1,000 Units total) by mouth daily.   MELATONIN 3 MG TABS    Take 3 mg by mouth.   QUETIAPINE (SEROQUEL) 25 MG TABLET    Take 25 mg by mouth at bedtime. Give one tablet along with 100 mg to equal 125 mg   SERTRALINE (ZOLOFT) 100 MG TABLET    Take 100 mg by mouth daily.  Modified Medications   No medications on file  Discontinued Medications   No medications on file     SIGNIFICANT DIAGNOSTIC EXAMS  04-06-15: ct of head: No evidence of acute intracranial abnormality. Atrophy with small vessel ischemic changes  04-06-15: acute abdomen with chest x-ray: 1. No acute cardiopulmonary disease. 2. Normal bowel gas pattern. 3. Atherosclerosis.  04-07-15: EEG: Impression: This awake and asleep EEG is abnormal due to mild to moderate diffuse slowing of the waking background. Clinical Correlation of the above findings indicates diffuse cerebral dysfunction that is non-specific in etiology and can be seen with hypoxic/ischemic injury, toxic/metabolic encephalopathies, neurodegenerative disorders, or medication  effect.  The absence of epileptiform discharges does not rule out a clinical diagnosis of epilepsy.  Clinical correlation is advised.   LABS REVIEWED:   03-27-15: tsh 1.740 04-06-15: wbc 5.4; hgb 11.4; hct 35.9; mcv 96.2; plt 151; glucose 218; bun 32; creaet 1.02; k+ 4.3; na++140; liver normal albumin 3.5; mag 1.8; phos 3.2; vit b12: 373; iron 33; tibc 181; ferritin 303; ammonia 24;  Folate 11.5 04-07-15: urine culture: multiple bacteria  04-17-15: wbc 6.6; hgb 11.9; hct 36.2; mcv 91.4; plt 250; glucose 83; bun 11; creat 0.70; k+ 4.9; na++138 10-11-15: wbc 6.1; hgb 11.8; hct 36.1; mcv 81.9; plt 242; glucose 65; bun 11; creat 0.69; k+ 4.2; na++ 143; liver normal albumin 3.4   Review of Systems  Unable to perform ROS: Dementia     Physical Exam  Constitutional: No distress.  Frail   Eyes: Conjunctivae are normal.  Neck: Neck supple. No JVD present. No thyromegaly present.  Cardiovascular: Normal rate, regular rhythm and intact distal pulses.   Respiratory: Effort normal and breath sounds normal. No respiratory distress. She has no wheezes.  GI: Soft. Bowel sounds are normal. She exhibits no distension. There is no tenderness.  Musculoskeletal: She exhibits no edema.  Able to move all extremities   Lymphadenopathy:    She has no cervical adenopathy.  Neurological: She is alert.  Skin: Skin is warm and dry. She is not diaphoretic.  Psychiatric: She has a normal mood and affect.       ASSESSMENT/ PLAN: .  1. Alzheimer's disease: no significant change in her status; is presently not on medications; will not make changes will monitor  Her weight is stable  At 137 pounds.   2. Depression: will continue zoloft 100 mg daily. Her current weight is 137  pounds will wean off seroquel and will monitor    MD is aware of resident's narcotic use and is in agreement with current plan of care. We will attempt to wean resident as appropriate.    Synthia Innocent NP [facility]    Contact [phone] Monday through Friday 8am- 5pm  After hours call [phone]

## 2016-03-21 ENCOUNTER — Encounter: Payer: Self-pay | Admitting: Adult Health

## 2016-03-21 ENCOUNTER — Non-Acute Institutional Stay (SKILLED_NURSING_FACILITY): Payer: Medicare Other | Admitting: Adult Health

## 2016-03-21 DIAGNOSIS — F0280 Dementia in other diseases classified elsewhere without behavioral disturbance: Secondary | ICD-10-CM

## 2016-03-21 DIAGNOSIS — R259 Unspecified abnormal involuntary movements: Secondary | ICD-10-CM | POA: Diagnosis not present

## 2016-03-21 DIAGNOSIS — D649 Anemia, unspecified: Secondary | ICD-10-CM

## 2016-03-21 DIAGNOSIS — G301 Alzheimer's disease with late onset: Secondary | ICD-10-CM

## 2016-03-21 DIAGNOSIS — I10 Essential (primary) hypertension: Secondary | ICD-10-CM

## 2016-03-21 DIAGNOSIS — F329 Major depressive disorder, single episode, unspecified: Secondary | ICD-10-CM

## 2016-03-21 DIAGNOSIS — I495 Sick sinus syndrome: Secondary | ICD-10-CM

## 2016-03-21 DIAGNOSIS — F0393 Unspecified dementia, unspecified severity, with mood disturbance: Secondary | ICD-10-CM

## 2016-03-21 NOTE — Progress Notes (Signed)
Patient ID: Sherri Leonard, female   DOB: 11/29/1920, 82 y.o.   MRN: [id number]   Provider:  Synthia Innocenteborah Elmina Hendel, NP Location:  [facility] Room Number: 101-A Place of Service:  SNF (31)   PCP: Kirt Boysarter, Monica, DO Patient Care Team: Kirt BoysMonica Carter, DO as PCP - General (Internal Medicine)  Extended Emergency Contact Information Primary Emergency Contact: [place],Armina Address: [address]          [place], [place] [address] [place] Home Phone: [phone] Mobile Phone: [phone] Relation: Grandaughter  Code Status: DNR Goals of Care: Advanced Directive information Advanced Directives 03/21/2016  Does patient have an advance directive? Yes  Type of Advance Directive Out of facility DNR (pink MOST or yellow form)  Does patient want to make changes to advanced directive? No - Patient declined  Copy of advanced directive(s) in chart? Yes  Would patient like information on creating an advanced directive? -      Allergies  Allergen Reactions  . Ativan [Lorazepam] Other (See Comments)    Causes violence   . Benzodiazepines      Chief Complaint  Patient presents with  . Annual Exam    Annual    HPI: Patient is a 82 y.o. female seen today for an annual comprehensive examination. Overall there has been little change in her status. She has not been hospitalized over the past year. She is unable to fully participate in the hpi or ros; but did tell me that she is feeling good. There are no nursing concerns at this time.    Past Medical History:  Diagnosis Date  . Dementia   . OA (osteoarthritis)   . Pacemaker    Past Surgical History:  Procedure Laterality Date  . ATRIAL CARDIAC PACEMAKER INSERTION  2012  . BRAIN SURGERY  2013  . CARPAL TUNNEL RELEASE Bilateral   . FLEXIBLE SIGMOIDOSCOPY N/A 09/12/2014   Procedure: FLEXIBLE SIGMOIDOSCOPY;  Surgeon: Beverley FiedlerJay M Pyrtle, MD;  Location: [facility];  Service: Gastroenterology;  Laterality: N/A;  .  KNEE ARTHROPLASTY Bilateral     reports that she has never smoked. She has never used smokeless tobacco. She reports that she does not drink alcohol or use drugs. Social History   Social History  . Marital status: Widowed    Spouse name: N/A  . Number of children: 1  . Years of education: N/A   Occupational History  . retired    Social History Main Topics  . Smoking status: Never Smoker  . Smokeless tobacco: Never Used  . Alcohol use No  . Drug use: No  . Sexual activity: No   Other Topics Concern  . Not on file   Social History Narrative   Diet:   Do you drink/eat things with caffeine? No   Marital status:     Widowed                         What year were you married?   Do you live in a house, apartment, assisted living, condo, trailer, etc)? Home   Is it one or more stories? One   How many persons live in your home? Two   Do you have any pets in your home? No   Current or past profession:   Do you exercise?      No  Type & how often:   Do you have a living will? Yes   Do you have a DNR Form? Yes   Do you have a POA/HPOA forms? Yes   Family History  Problem Relation Age of Onset  . Heart disease Son   . Cancer Son 60    on Liver  . Alzheimer's disease Mother   . Alzheimer's disease Father   . Heart attack Son   . Colon polyps Neg Hx   . Diabetes Neg Hx   . Kidney disease Neg Hx     Vitals:   03/21/16 1016  BP: 105/61  Pulse: 73  Resp: 16  Temp: 98.9 F (37.2 C)  TempSrc: Oral  SpO2: 97%  Weight: 137 lb 8 oz (62.4 kg)  Height: 5\' 1"  (1.549 m)   Body mass index is 25.98 kg/m.    Medication List       Accurate as of 03/21/16 11:07 AM. Always use your most recent med list.          acetaminophen 325 MG tablet Commonly known as:  TYLENOL Take 650 mg by mouth every 4 (four) hours as needed.   cholecalciferol 1000 units tablet Commonly known as:  VITAMIN D Take 1 tablet (1,000 Units total) by mouth  daily.   Melatonin 3 MG Tabs Take 3 mg by mouth.   naproxen sodium 220 MG tablet Commonly known as:  ANAPROX Take 220 mg by mouth 2 (two) times daily with a meal.   sertraline 100 MG tablet Commonly known as:  ZOLOFT Take 100 mg by mouth daily.        SIGNIFICANT DIAGNOSTIC EXAMS  04-06-15: ct of head: No evidence of acute intracranial abnormality. Atrophy with small vessel ischemic changes  04-06-15: acute abdomen with chest x-ray: 1. No acute cardiopulmonary disease. 2. Normal bowel gas pattern. 3. Atherosclerosis.  04-07-15: EEG: Impression: This awake and asleep EEG is abnormal due to mild to moderate diffuse slowing of the waking background. Clinical Correlation of the above findings indicates diffuse cerebral dysfunction that is non-specific in etiology and can be seen with hypoxic/ischemic injury, toxic/metabolic encephalopathies, neurodegenerative disorders, or medication effect.  The absence of epileptiform discharges does not rule out a clinical diagnosis of epilepsy.  Clinical correlation is advised.   LABS REVIEWED:   03-27-15: tsh 1.740 04-06-15: wbc 5.4; hgb 11.4; hct 35.9; mcv 96.2; plt 151; glucose 218; bun 32; creaet 1.02; k+ 4.3; na++140; liver normal albumin 3.5; mag 1.8; phos 3.2; vit b12: 373; iron 33; tibc 181; ferritin 303; ammonia 24;  Folate 11.5 04-07-15: urine culture: multiple bacteria  04-17-15: wbc 6.6; hgb 11.9; hct 36.2; mcv 91.4; plt 250; glucose 83; bun 11; creat 0.70; k+ 4.9; na++138 10-11-15: wbc 6.1; hgb 11.8; hct 36.1; mcv 81.9; plt 242; glucose 65; bun 11; creat 0.69; k+ 4.2; na++ 143; liver normal albumin 3.4   Review of Systems  Unable to perform ROS: Dementia     Physical Exam  Constitutional: No distress.  Frail   Eyes: Conjunctivae are normal.  Neck: Neck supple. No JVD present. No thyromegaly present.  Cardiovascular: Normal rate, regular rhythm and intact distal pulses.   Respiratory: Effort normal and breath sounds normal.  No respiratory distress. She has no wheezes.  GI: Soft. Bowel sounds are normal. She exhibits no distension. There is no tenderness.  Musculoskeletal: She exhibits no edema.  Able to move all extremities   Lymphadenopathy:    She has no cervical adenopathy.  Neurological: She is  alert.  Skin: Skin is warm and dry. She is not diaphoretic.  Psychiatric: She has a normal mood and affect.       ASSESSMENT/ PLAN: .  1. Alzheimer's disease: no significant change in her status; is presently not on medications; will not make changes will monitor  Her weight is stable  At 137 pounds.   2. Possible parkinson's disease: is presently not on medications; will not make changes will monitor her status.   3. Depression: will continue zoloft 100 mg daily. Her current weight is 137  pounds   4. Anemia: her current hgb is 11.9; is presently not taking medications.   5. Hypertension: is not on medications; will monitor   6. Sick sinus syndrome: is status post pacer maker insertion; will monitor   Will check cbc; cmp tsh   Health maintenance is up to date.   Time spent with patient  45  minutes >50% time spent counseling; reviewing medical record; tests; labs; and developing future plan of care   Synthia Innocent NP [facility]  Contact [phone] Monday through Friday 8am- 5pm  After hours call [phone]

## 2016-04-22 ENCOUNTER — Non-Acute Institutional Stay (SKILLED_NURSING_FACILITY): Payer: Medicare Other | Admitting: Adult Health

## 2016-04-22 ENCOUNTER — Encounter: Payer: Self-pay | Admitting: Adult Health

## 2016-04-22 DIAGNOSIS — M1990 Unspecified osteoarthritis, unspecified site: Secondary | ICD-10-CM | POA: Diagnosis not present

## 2016-04-22 DIAGNOSIS — I495 Sick sinus syndrome: Secondary | ICD-10-CM

## 2016-04-22 DIAGNOSIS — D649 Anemia, unspecified: Secondary | ICD-10-CM

## 2016-04-22 DIAGNOSIS — I10 Essential (primary) hypertension: Secondary | ICD-10-CM

## 2016-04-22 DIAGNOSIS — R259 Unspecified abnormal involuntary movements: Secondary | ICD-10-CM

## 2016-04-22 DIAGNOSIS — G301 Alzheimer's disease with late onset: Secondary | ICD-10-CM

## 2016-04-22 DIAGNOSIS — F0280 Dementia in other diseases classified elsewhere without behavioral disturbance: Secondary | ICD-10-CM

## 2016-04-22 NOTE — Progress Notes (Signed)
Patient ID: Sherri Leonard, female   DOB: 05/08/1921, 82 y.o.   MRN: [id number]   Location:   [facility] Room Number: 101-A Place of Service:  SNF (31)   CODE STATUS: DNR  Allergies  Allergen Reactions  . Ativan [Lorazepam] Other (See Comments)    Causes violence   . Benzodiazepines     Chief Complaint  Patient presents with  . Medical Management of Chronic Issues    Follow up    HPI:  She is a long term resident of this facility being seen for the management of her chronic illnesses. Overall there is little change in her status. She is unable to fully participate in the hpi or ros. There are no reports of her having pain present. There are no nursing concerns at this time.    Past Medical History:  Diagnosis Date  . Dementia   . OA (osteoarthritis)   . Pacemaker     Past Surgical History:  Procedure Laterality Date  . ATRIAL CARDIAC PACEMAKER INSERTION  2012  . BRAIN SURGERY  2013  . CARPAL TUNNEL RELEASE Bilateral   . FLEXIBLE SIGMOIDOSCOPY N/A 09/12/2014   Procedure: FLEXIBLE SIGMOIDOSCOPY;  Surgeon: Beverley FiedlerJay M Pyrtle, MD;  Location: [facility];  Service: Gastroenterology;  Laterality: N/A;  . KNEE ARTHROPLASTY Bilateral     Social History   Social History  . Marital status: Widowed    Spouse name: N/A  . Number of children: 1  . Years of education: N/A   Occupational History  . retired    Social History Main Topics  . Smoking status: Never Smoker  . Smokeless tobacco: Never Used  . Alcohol use No  . Drug use: No  . Sexual activity: No   Other Topics Concern  . Not on file   Social History Narrative   Diet:   Do you drink/eat things with caffeine? No   Marital status:     Widowed                         What year were you married?   Do you live in a house, apartment, assisted living, condo, trailer, etc)? Home   Is it one or more stories? One   How many persons live in your home? Two   Do you have any pets in your home? No   Current or past profession:   Do you exercise?      No                                              Type & how often:   Do you have a living will? Yes   Do you have a DNR Form? Yes   Do you have a POA/HPOA forms? Yes   Family History  Problem Relation Age of Onset  . Heart disease Son   . Cancer Son 60    on Liver  . Alzheimer's disease Mother   . Alzheimer's disease Father   . Heart attack Son   . Colon polyps Neg Hx   . Diabetes Neg Hx   . Kidney disease Neg Hx       VITAL SIGNS BP 128/70   Pulse 74   Temp 97.5 F (36.4 C) (Oral)   Resp 18   Ht 5\' 1"  (1.549 m)  Wt 145 lb 2 oz (65.8 kg)   SpO2 96%   BMI 27.42 kg/m   Patient's Medications  New Prescriptions   No medications on file  Previous Medications   ACETAMINOPHEN (TYLENOL) 325 MG TABLET    Take 650 mg by mouth every 4 (four) hours as needed.   CHOLECALCIFEROL (VITAMIN D) 1000 UNITS TABLET    Take 1 tablet (1,000 Units total) by mouth daily.   MELATONIN 3 MG TABS    Take 3 mg by mouth.   NAPROXEN SODIUM (ANAPROX) 220 MG TABLET    Take 220 mg by mouth 2 (two) times daily with a meal.   SERTRALINE (ZOLOFT) 100 MG TABLET    Take 100 mg by mouth daily.   SERTRALINE (ZOLOFT) 25 MG TABLET    Take 25 mg by mouth daily. Give with 100mg  to equal 125 mg QD  Modified Medications   No medications on file  Discontinued Medications   QUETIAPINE (SEROQUEL) 25 MG TABLET    Take 25 mg by mouth at bedtime. Give one tablet along with 100 mg to equal 125 mg     SIGNIFICANT DIAGNOSTIC EXAMS  04-06-15: ct of head: No evidence of acute intracranial abnormality. Atrophy with small vessel ischemic changes  04-06-15: acute abdomen with chest x-ray: 1. No acute cardiopulmonary disease. 2. Normal bowel gas pattern. 3. Atherosclerosis.  04-07-15: EEG: Impression: This awake and asleep EEG is abnormal due to mild to moderate diffuse slowing of the waking background. Clinical Correlation of the above findings indicates diffuse  cerebral dysfunction that is non-specific in etiology and can be seen with hypoxic/ischemic injury, toxic/metabolic encephalopathies, neurodegenerative disorders, or medication effect.  The absence of epileptiform discharges does not rule out a clinical diagnosis of epilepsy.  Clinical correlation is advised.  03-04-16: left hand x-ray: modest osteoarthritis of left hand     LABS REVIEWED:   10-11-15: wbc 6.1; hgb 11.8; hct 36.1; mcv 81.9; plt 242; glucose 65; bun 11; creat 0.69; k+ 4.2; na++ 143; liver normal albumin 3.4 03-04-16: uric acid 3.8  03-05-16: wbc 7.6; hgb 10.8; hct 34.6; mcv 92.6; plt 192 glucose 101; bun 26.0; creat 0.69; k+ 4.1; na++ 140; liver normal albumin 3.8 sed rate 85  03-22-16: wbc 6.2; hgb 10.5; hct 31.7; mcv 97.9; plt 138; glucose 92; bun 42.0; creat 0.76; k+ 4.7; na++ 141; liver normal albumin 3.4 tsh 4.01;     Review of Systems  Unable to perform ROS: Dementia     Physical Exam  Constitutional: No distress.  Frail   Eyes: Conjunctivae are normal.  Neck: Neck supple. No JVD present. No thyromegaly present.  Cardiovascular: Normal rate, regular rhythm and intact distal pulses.   Respiratory: Effort normal and breath sounds normal. No respiratory distress. She has no wheezes.  GI: Soft. Bowel sounds are normal. She exhibits no distension. There is no tenderness.  Musculoskeletal: She exhibits no edema.  Able to move all extremities   Lymphadenopathy:    She has no cervical adenopathy.  Neurological: She is alert.  Skin: Skin is warm and dry. She is not diaphoretic.  Psychiatric: She has a normal mood and affect.       ASSESSMENT/ PLAN: .  1. Alzheimer's disease: no significant change in her status; is presently not on medications; will not make changes will monitor  Her weight is stable  At 145 pounds.   2. Parkinson features : is presently not on medications; will not make changes will monitor her status.   3.  Depression: will continue zoloft 125  mg daily. Her current weight is 145  pounds   4. Anemia: her current hgb is 10.5; is presently not taking medications.   5. Hypertension: is not on medications; will monitor   6. Sick sinus syndrome: is status post pacer maker insertion; will monitor   7. Osteoarthritis: will continue aleve 220 mg twice daily       Synthia Innocenteborah Rubee Vega NP [facility]  Contact [phone] Monday through Friday 8am- 5pm  After hours call [phone]

## 2016-05-27 ENCOUNTER — Non-Acute Institutional Stay (SKILLED_NURSING_FACILITY): Payer: Medicare Other | Admitting: Adult Health

## 2016-05-27 ENCOUNTER — Encounter: Payer: Self-pay | Admitting: Adult Health

## 2016-05-27 DIAGNOSIS — I10 Essential (primary) hypertension: Secondary | ICD-10-CM

## 2016-05-27 DIAGNOSIS — G301 Alzheimer's disease with late onset: Secondary | ICD-10-CM | POA: Diagnosis not present

## 2016-05-27 DIAGNOSIS — I495 Sick sinus syndrome: Secondary | ICD-10-CM

## 2016-05-27 DIAGNOSIS — R259 Unspecified abnormal involuntary movements: Secondary | ICD-10-CM | POA: Diagnosis not present

## 2016-05-27 DIAGNOSIS — F0280 Dementia in other diseases classified elsewhere without behavioral disturbance: Secondary | ICD-10-CM | POA: Diagnosis not present

## 2016-05-27 DIAGNOSIS — Z950 Presence of cardiac pacemaker: Secondary | ICD-10-CM | POA: Insufficient documentation

## 2016-05-27 DIAGNOSIS — M1990 Unspecified osteoarthritis, unspecified site: Secondary | ICD-10-CM | POA: Diagnosis not present

## 2016-05-27 NOTE — Progress Notes (Signed)
[facility]    Place of Service:  SNF (31)    CODE STATUS: dnr  Allergies  Allergen Reactions  . Ativan [Lorazepam] Other (See Comments)    Causes violence   . Benzodiazepines     Chief Complaint  Patient presents with  . Medical Management of Chronic Issues    HPI:  She is a long term resident of this facility being seen for the management of her chronic illnesses. Her status is without significant change. She does get out of bed daily and does participate with activities. There are no nursing concerns at this time.    Past Medical History:  Diagnosis Date  . Dementia   . OA (osteoarthritis)   . Pacemaker     Past Surgical History:  Procedure Laterality Date  . ATRIAL CARDIAC PACEMAKER INSERTION  2012  . BRAIN SURGERY  2013  . CARPAL TUNNEL RELEASE Bilateral   . FLEXIBLE SIGMOIDOSCOPY N/A 09/12/2014   Procedure: FLEXIBLE SIGMOIDOSCOPY;  Surgeon: Beverley FiedlerJay M Pyrtle, MD;  Location: [facility];  Service: Gastroenterology;  Laterality: N/A;  . KNEE ARTHROPLASTY Bilateral     Social History   Social History  . Marital status: Widowed    Spouse name: N/A  . Number of children: 1  . Years of education: N/A   Occupational History  . retired    Social History Main Topics  . Smoking status: Never Smoker  . Smokeless tobacco: Never Used  . Alcohol use No  . Drug use: No  . Sexual activity: No   Other Topics Concern  . Not on file   Social History Narrative   Diet:   Do you drink/eat things with caffeine? No   Marital status:     Widowed                         What year were you married?   Do you live in a house, apartment, assisted living, condo, trailer, etc)? Home   Is it one or more stories? One   How many persons live in your home? Two   Do you have any pets in your home? No   Current or past profession:   Do you exercise?      No                                              Type & how often:   Do you have a living will? Yes   Do you have  a DNR Form? Yes   Do you have a POA/HPOA forms? Yes   Family History  Problem Relation Age of Onset  . Heart disease Son   . Cancer Son 60    on Liver  . Alzheimer's disease Mother   . Alzheimer's disease Father   . Heart attack Son   . Colon polyps Neg Hx   . Diabetes Neg Hx   . Kidney disease Neg Hx       VITAL SIGNS BP 130/66   Pulse 72   Temp 97.7 F (36.5 C)   Resp 18   Ht 5\' 1"  (1.549 m)   Wt 141 lb 14.4 oz (64.4 kg)   SpO2 95%   BMI 26.81 kg/m   Patient's Medications  New Prescriptions   No medications on file  Previous Medications  ACETAMINOPHEN (TYLENOL) 325 MG TABLET    Take 650 mg by mouth every 4 (four) hours as needed.   CHOLECALCIFEROL (VITAMIN D) 1000 UNITS TABLET    Take 1 tablet (1,000 Units total) by mouth daily.   MELATONIN 3 MG TABS    Take 3 mg by mouth.   NAPROXEN SODIUM (ANAPROX) 220 MG TABLET    Take 220 mg by mouth 2 (two) times daily with a meal.   SERTRALINE (ZOLOFT) 100 MG TABLET    Take 100 mg by mouth daily.   SERTRALINE (ZOLOFT) 25 MG TABLET    Take 25 mg by mouth daily. Give with 100mg  to equal 125 mg QD  Modified Medications   No medications on file  Discontinued Medications   No medications on file     SIGNIFICANT DIAGNOSTIC EXAMS   04-06-15: ct of head: No evidence of acute intracranial abnormality. Atrophy with small vessel ischemic changes  04-06-15: acute abdomen with chest x-ray: 1. No acute cardiopulmonary disease. 2. Normal bowel gas pattern. 3. Atherosclerosis.  04-07-15: EEG: Impression: This awake and asleep EEG is abnormal due to mild to moderate diffuse slowing of the waking background. Clinical Correlation of the above findings indicates diffuse cerebral dysfunction that is non-specific in etiology and can be seen with hypoxic/ischemic injury, toxic/metabolic encephalopathies, neurodegenerative disorders, or medication effect.  The absence of epileptiform discharges does not rule out a clinical diagnosis of  epilepsy.  Clinical correlation is advised.  03-04-16: left hand x-ray: modest osteoarthritis of left hand     LABS REVIEWED:   10-11-15: wbc 6.1; hgb 11.8; hct 36.1; mcv 81.9; plt 242; glucose 65; bun 11; creat 0.69; k+ 4.2; na++ 143; liver normal albumin 3.4 03-04-16: uric acid 3.8  03-05-16: wbc 7.6; hgb 10.8; hct 34.6; mcv 92.6; plt 192 glucose 101; bun 26.0; creat 0.69; k+ 4.1; na++ 140; liver normal albumin 3.8 sed rate 85  03-22-16: wbc 6.2; hgb 10.5; hct 31.7; mcv 97.9; plt 138; glucose 92; bun 42.0; creat 0.76; k+ 4.7; na++ 141; liver normal albumin 3.4 tsh 4.01;     Review of Systems  Unable to perform ROS: Dementia     Physical Exam  Constitutional: No distress.  Frail   Eyes: Conjunctivae are normal.  Neck: Neck supple. No JVD present. No thyromegaly present.  Cardiovascular: Normal rate, regular rhythm and intact distal pulses.   Respiratory: Effort normal and breath sounds normal. No respiratory distress. She has no wheezes.  GI: Soft. Bowel sounds are normal. She exhibits no distension. There is no tenderness.  Musculoskeletal: She exhibits no edema.  Able to move all extremities   Lymphadenopathy:    She has no cervical adenopathy.  Neurological: She is alert.  Skin: Skin is warm and dry. She is not diaphoretic.  Psychiatric: She has a normal mood and affect.       ASSESSMENT/ PLAN: .  1. Alzheimer's disease: no significant change in her status; is presently not on medications; will not make changes will monitor  Her weight is stable  At 145 pounds.   2. Parkinson features : is presently not on medications; will not make changes will monitor her status.   3. Depression: will continue zoloft 125 mg daily. Her current weight is 145  pounds   4. Anemia: her current hgb is 10.5; is presently not taking medications.   5. Hypertension: is not on medications; will monitor   6. Sick sinus syndrome: is status post pacer maker insertion; will monitor   7.  Osteoarthritis: will continue aleve 220 mg twice daily          Synthia Innocent NP [facility]  Contact [phone] Monday through Friday 8am- 5pm  After hours call [phone]

## 2016-07-01 ENCOUNTER — Non-Acute Institutional Stay (SKILLED_NURSING_FACILITY): Payer: Medicare Other | Admitting: Adult Health

## 2016-07-01 DIAGNOSIS — R259 Unspecified abnormal involuntary movements: Secondary | ICD-10-CM | POA: Diagnosis not present

## 2016-07-01 DIAGNOSIS — M1990 Unspecified osteoarthritis, unspecified site: Secondary | ICD-10-CM

## 2016-07-01 DIAGNOSIS — F329 Major depressive disorder, single episode, unspecified: Secondary | ICD-10-CM

## 2016-07-01 DIAGNOSIS — Z950 Presence of cardiac pacemaker: Secondary | ICD-10-CM

## 2016-07-01 DIAGNOSIS — F0393 Unspecified dementia, unspecified severity, with mood disturbance: Secondary | ICD-10-CM

## 2016-07-01 DIAGNOSIS — I10 Essential (primary) hypertension: Secondary | ICD-10-CM | POA: Diagnosis not present

## 2016-07-01 DIAGNOSIS — G301 Alzheimer's disease with late onset: Secondary | ICD-10-CM

## 2016-07-01 DIAGNOSIS — I495 Sick sinus syndrome: Secondary | ICD-10-CM | POA: Diagnosis not present

## 2016-07-01 DIAGNOSIS — R29818 Other symptoms and signs involving the nervous system: Secondary | ICD-10-CM

## 2016-07-01 DIAGNOSIS — F0280 Dementia in other diseases classified elsewhere without behavioral disturbance: Secondary | ICD-10-CM | POA: Diagnosis not present

## 2016-07-27 LAB — HEMOGLOBIN A1C: HEMOGLOBIN A1C: 6.6

## 2016-07-31 NOTE — Progress Notes (Signed)
Location:   Database administrator of Service:  SNF (31)   CODE STATUS: dnr  Allergies  Allergen Reactions  . Ativan [Lorazepam] Other (See Comments)    Causes violence   . Benzodiazepines     Chief Complaint  Patient presents with  . Medical Management of Chronic Issues    HPI:  She is a resident of this facility being seen for the management of her chronic illnesses. There is little change in her status. She is unable to fully participate in the hpi or ros; but did tell me that her shoulders hurt. There are no nursing concerns at this time.  '  Past Medical History:  Diagnosis Date  . Dementia   . OA (osteoarthritis)   . Pacemaker     Past Surgical History:  Procedure Laterality Date  . ATRIAL CARDIAC PACEMAKER INSERTION  2012  . BRAIN SURGERY  2013  . CARPAL TUNNEL RELEASE Bilateral   . FLEXIBLE SIGMOIDOSCOPY N/A 09/12/2014   Procedure: FLEXIBLE SIGMOIDOSCOPY;  Surgeon: Beverley Fiedler, MD;  Location: [facility];  Service: Gastroenterology;  Laterality: N/A;  . KNEE ARTHROPLASTY Bilateral     Social History   Social History  . Marital status: Widowed    Spouse name: N/A  . Number of children: 1  . Years of education: N/A   Occupational History  . retired    Social History Main Topics  . Smoking status: Never Smoker  . Smokeless tobacco: Never Used  . Alcohol use No  . Drug use: No  . Sexual activity: No   Other Topics Concern  . Not on file   Social History Narrative   Diet:   Do you drink/eat things with caffeine? No   Marital status:     Widowed                         What year were you married?   Do you live in a house, apartment, assisted living, condo, trailer, etc)? Home   Is it one or more stories? One   How many persons live in your home? Two   Do you have any pets in your home? No   Current or past profession:   Do you exercise?      No                                              Type & how often:   Do you have a living will? Yes     Do you have a DNR Form? Yes   Do you have a POA/HPOA forms? Yes   Family History  Problem Relation Age of Onset  . Heart disease Son   . Cancer Son 60    on Liver  . Alzheimer's disease Mother   . Alzheimer's disease Father   . Heart attack Son   . Colon polyps Neg Hx   . Diabetes Neg Hx   . Kidney disease Neg Hx       VITAL SIGNS BP 114/81   Pulse 98   Temp 98.1 F (36.7 C)   Resp 18   Ht 5\' 1"  (1.549 m)   Wt 138 lb 3.2 oz (62.7 kg)   SpO2 95%   BMI 26.11 kg/m   Patient's Medications  New Prescriptions   No  medications on file  Previous Medications   ACETAMINOPHEN (TYLENOL) 325 MG TABLET    Take 650 mg by mouth every 4 (four) hours as needed.   CHOLECALCIFEROL (VITAMIN D) 1000 UNITS TABLET    Take 1 tablet (1,000 Units total) by mouth daily.   MELATONIN 3 MG TABS    Take 3 mg by mouth.   NAPROXEN SODIUM (ANAPROX) 220 MG TABLET    Take 220 mg by mouth 2 (two) times daily with a meal.   SERTRALINE (ZOLOFT) 100 MG TABLET    Take 100 mg by mouth daily.   SERTRALINE (ZOLOFT) 25 MG TABLET    Take 25 mg by mouth daily. Give with 100mg  to equal 125 mg QD  Modified Medications   No medications on file  Discontinued Medications   No medications on file     SIGNIFICANT DIAGNOSTIC EXAMS   04-06-15: ct of head: No evidence of acute intracranial abnormality. Atrophy with small vessel ischemic changes  04-06-15: acute abdomen with chest x-ray: 1. No acute cardiopulmonary disease. 2. Normal bowel gas pattern. 3. Atherosclerosis.  04-07-15: EEG: Impression: This awake and asleep EEG is abnormal due to mild to moderate diffuse slowing of the waking background. Clinical Correlation of the above findings indicates diffuse cerebral dysfunction that is non-specific in etiology and can be seen with hypoxic/ischemic injury, toxic/metabolic encephalopathies, neurodegenerative disorders, or medication effect.  The absence of epileptiform discharges does not rule out a clinical  diagnosis of epilepsy.  Clinical correlation is advised.  03-04-16: left hand x-ray: modest osteoarthritis of left hand     LABS REVIEWED:   10-11-15: wbc 6.1; hgb 11.8; hct 36.1; mcv 81.9; plt 242; glucose 65; bun 11; creat 0.69; k+ 4.2; na++ 143; liver normal albumin 3.4 03-04-16: uric acid 3.8  03-05-16: wbc 7.6; hgb 10.8; hct 34.6; mcv 92.6; plt 192 glucose 101; bun 26.0; creat 0.69; k+ 4.1; na++ 140; liver normal albumin 3.8 sed rate 85  03-22-16: wbc 6.2; hgb 10.5; hct 31.7; mcv 97.9; plt 138; glucose 92; bun 42.0; creat 0.76; k+ 4.7; na++ 141; liver normal albumin 3.4 tsh 4.01;     Review of Systems  Unable to perform ROS: Dementia     Physical Exam  Constitutional: No distress.  Frail   Eyes: Conjunctivae are normal.  Neck: Neck supple. No JVD present. No thyromegaly present.  Cardiovascular: Normal rate, regular rhythm and intact distal pulses.   Respiratory: Effort normal and breath sounds normal. No respiratory distress. She has no wheezes.  GI: Soft. Bowel sounds are normal. She exhibits no distension. There is no tenderness.  Musculoskeletal: She exhibits no edema.  Able to move all extremities   Lymphadenopathy:    She has no cervical adenopathy.  Neurological: She is alert.  Skin: Skin is warm and dry. She is not diaphoretic.  Psychiatric: She has a normal mood and affect.       ASSESSMENT/ PLAN: .  1. Alzheimer's disease: no significant change in her status; is presently not on medications; will not make changes will monitor  Her weight is stable  At 138 pounds.   2. Parkinson features : is presently not on medications; will not make changes will monitor her status.   3. Depression: will continue zoloft 125 mg daily. Her current weight is 138  pounds   4. Anemia: her current hgb is 10.5; is presently not taking medications.   5. Hypertension: is not on medications; will monitor   6. Sick sinus syndrome: is status post  pacer maker insertion; will  monitor   7. Osteoarthritis: will continue aleve 220 mg twice daily and will begin tylenol 650 mg twice daily       Synthia Innocent NP [facility]  Contact [phone] Monday through Friday 8am- 5pm  After hours call [phone]

## 2016-08-07 IMAGING — CR DG ABDOMEN ACUTE W/ 1V CHEST
4 series · 4 of 4 positions shown · non-contrast
Comparison: CT of the abdomen and pelvis 08/24/2014.

CLINICAL DATA: Altered mental status.

EXAM:
DG ABDOMEN ACUTE W/ 1V CHEST

[w abdomen decub *]
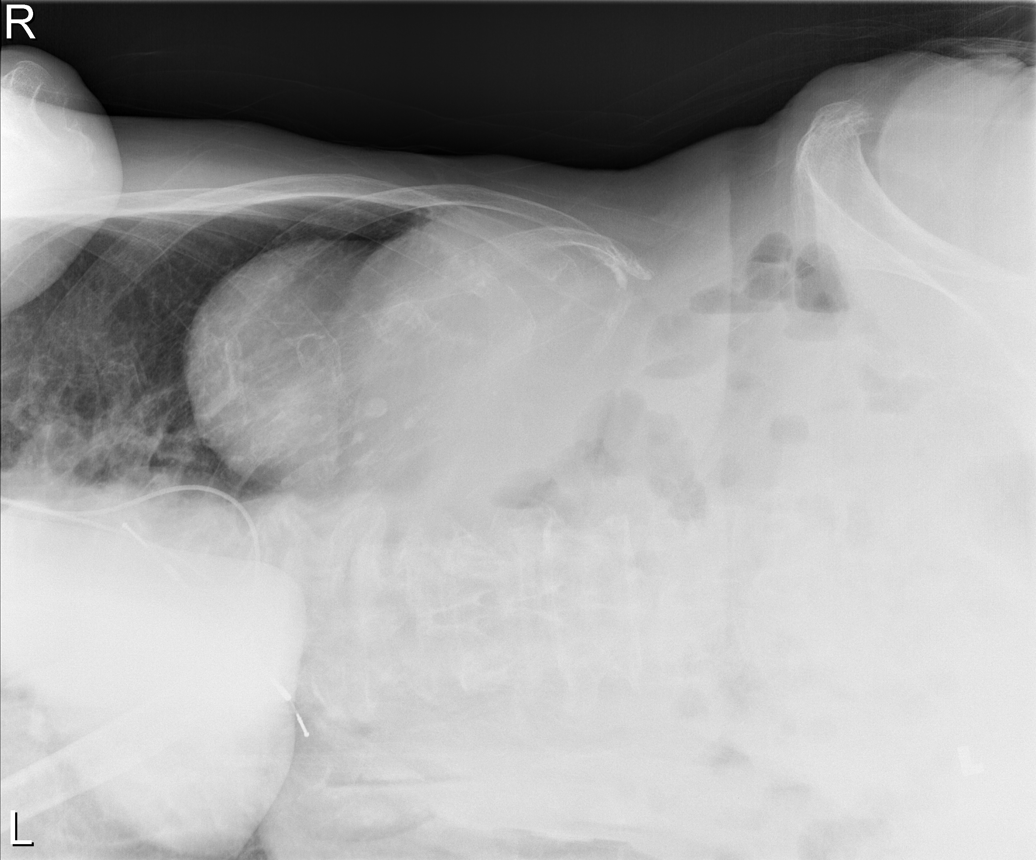

[t chest supine *]
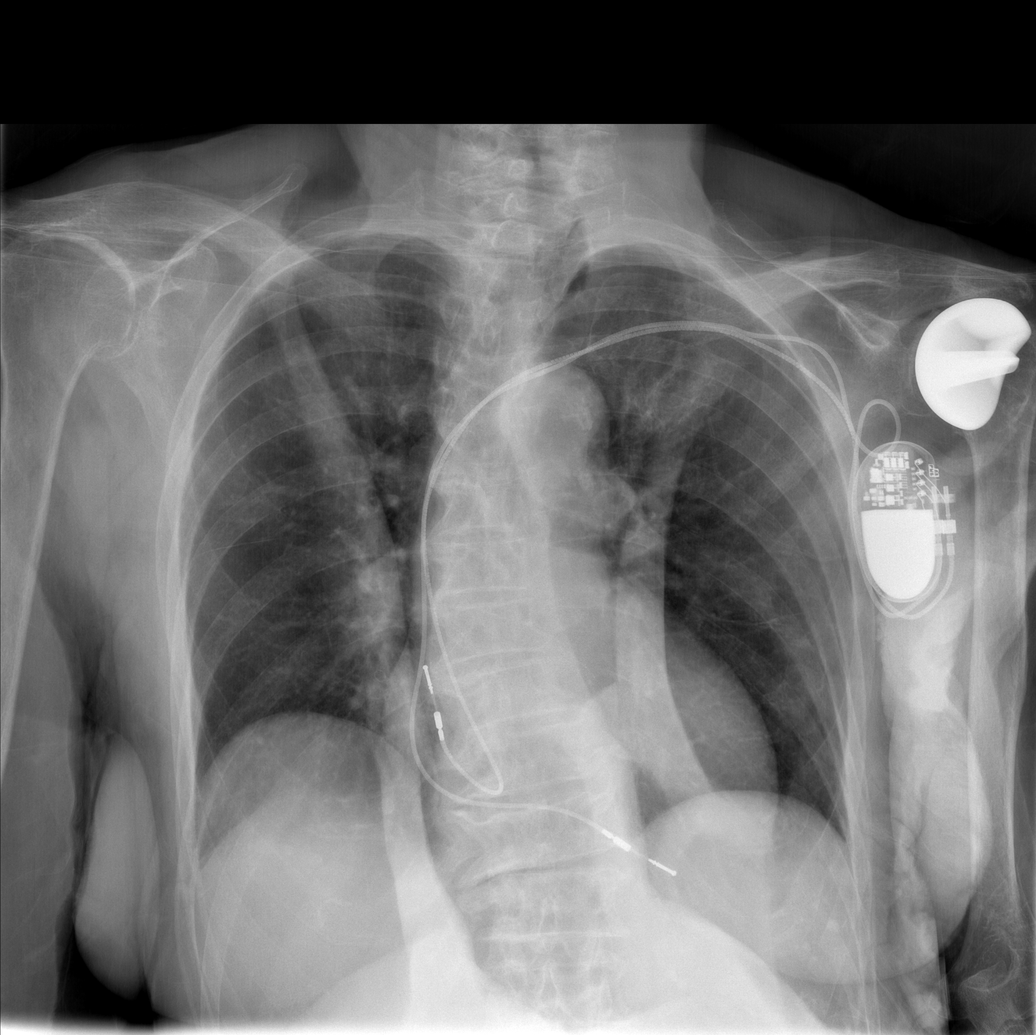

[t abdomen supine (1 of 2)]
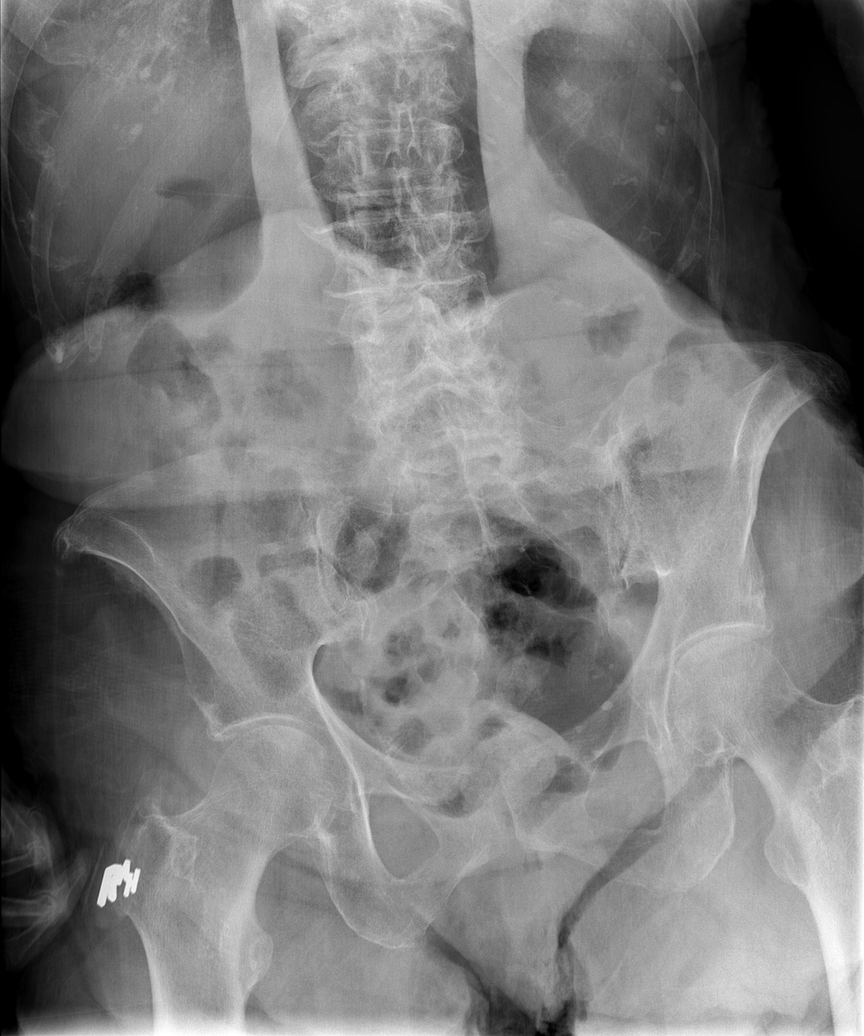

[t abdomen supine (2 of 2)]
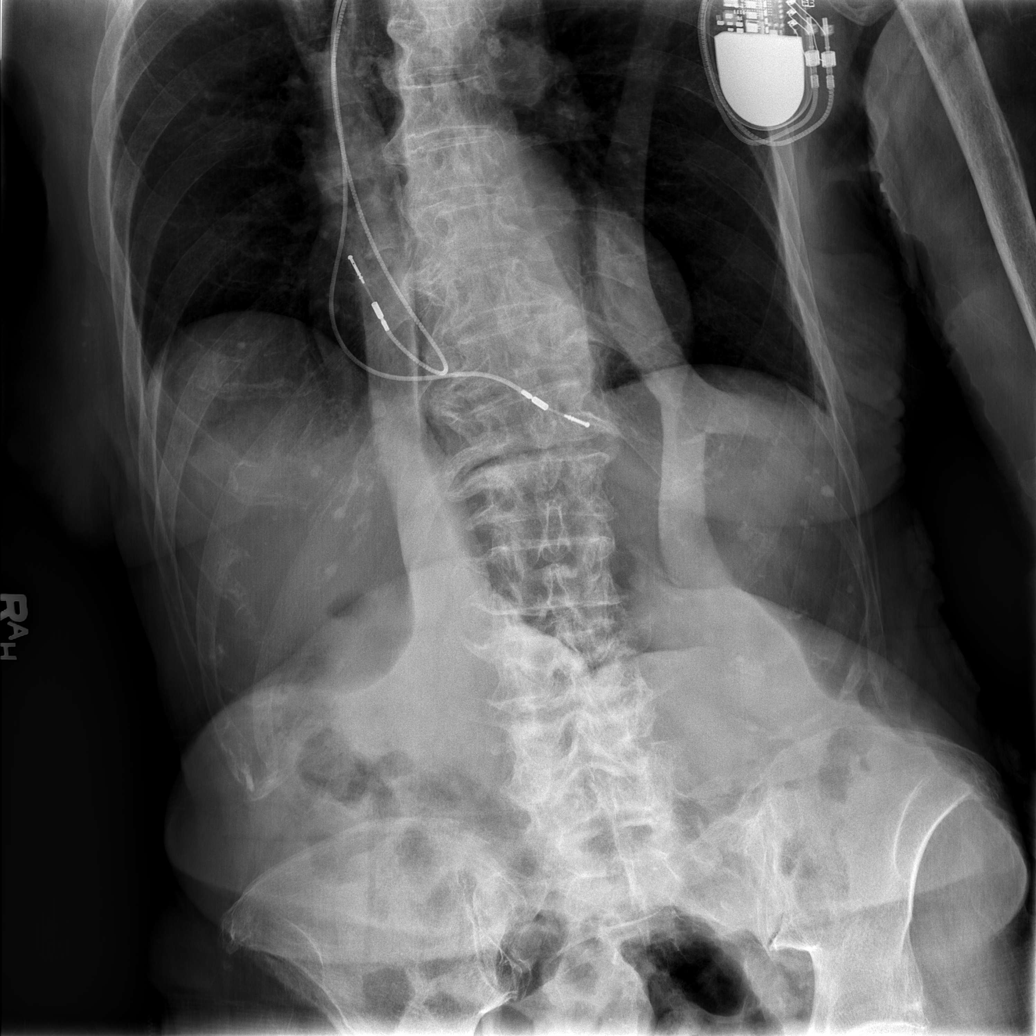

[4 of 4 positions shown; findings below may reference images not displayed]

FINDINGS: The heart size is normal. Atherosclerotic changes are present at the
aortic arch. Lungs are clear.

The bowel gas pattern is normal. There is no free air or
obstruction. Multilevel degenerative changes are noted throughout
the thoracic spine. Pacing wires are stable.
IMPRESSION: 1. No acute cardiopulmonary disease.
2. Normal bowel gas pattern.
3. Atherosclerosis.

## 2016-08-28 ENCOUNTER — Non-Acute Institutional Stay (SKILLED_NURSING_FACILITY): Payer: Medicare Other | Admitting: Internal Medicine

## 2016-08-28 ENCOUNTER — Encounter: Payer: Self-pay | Admitting: Internal Medicine

## 2016-08-28 DIAGNOSIS — F0280 Dementia in other diseases classified elsewhere without behavioral disturbance: Secondary | ICD-10-CM

## 2016-08-28 DIAGNOSIS — R739 Hyperglycemia, unspecified: Secondary | ICD-10-CM

## 2016-08-28 DIAGNOSIS — D649 Anemia, unspecified: Secondary | ICD-10-CM | POA: Diagnosis not present

## 2016-08-28 DIAGNOSIS — F329 Major depressive disorder, single episode, unspecified: Secondary | ICD-10-CM | POA: Diagnosis not present

## 2016-08-28 DIAGNOSIS — F0393 Unspecified dementia, unspecified severity, with mood disturbance: Secondary | ICD-10-CM

## 2016-08-28 DIAGNOSIS — I10 Essential (primary) hypertension: Secondary | ICD-10-CM

## 2016-08-28 DIAGNOSIS — M1990 Unspecified osteoarthritis, unspecified site: Secondary | ICD-10-CM | POA: Insufficient documentation

## 2016-08-28 NOTE — Assessment & Plan Note (Signed)
07/27/16 A1c 6.6% which is borderline diabetes. At her age no treatment is indicated. A1c goal is 8%

## 2016-08-28 NOTE — Patient Instructions (Signed)
See assessment and plan under each diagnosis in the problem list and acutely for this visit 

## 2016-08-28 NOTE — Assessment & Plan Note (Signed)
The hemoglobin and hematocrit had dropped slightly on prior values. At 33 she is at high risk for nonsteroidal induced gastritis and GI bleeding. CBC will be checked and the NSAID changed to a lower risk medicine.

## 2016-08-28 NOTE — Assessment & Plan Note (Signed)
To decrease risk of gastitis from a nonsteroidal,  Tylenol 325 every 6 hours as needed and meloxicam 7.5 daily Rxed

## 2016-08-28 NOTE — Assessment & Plan Note (Signed)
BP controlled; no antihypertensive indicated

## 2016-08-28 NOTE — Assessment & Plan Note (Signed)
The patient is on extremely large doses of SSRI, an opportunity exists to "Deprescribe" and wean the dose if possible

## 2016-08-28 NOTE — Progress Notes (Signed)
Patient ID: Sherri Leonard, female   DOB: 1920/10/23, 83 y.o.   MRN: [id number]   This is a nursing facility follow up of chronic medical diagnoses  Interim medical record and care since last [facility] visit was updated with review of diagnostic studies and change in clinical status since last visit were documented.  HPI: The patient is on naproxen 220 mg twice a day with meals for osteoarthritis. She is also on sertraline a total 125 mg daily. Her A1c was 6.6% on 07/27/16 indicating excellent hyperglycemic control. Last chemistries were  03/05/16. Liver function was normal. She had minimal prerenal azotemia. She did exhibit a mild anemia with hemoglobin 10.8 hematocrit 35.  Past history includes pacemaker insertion, osteoarthritis, and dementia. She has a diagnosis of essential hypertension, but she is on no antihypertensive medications. Her Alzheimer's has been associated with depression. Pacemaker was placed for sick sinus syndrome.  She has had bilateral knee arthroplasty. She also had brain surgery in 2013.   Family history is positive for Alzheimer's in both parents. There is also heart disease in her female children.  Review of systems: Dementia prevented completion. She was unable to provide any meaningful history. She became somewhat agitated when I asked her to name the date and the president. She began to babble unintelligibly  Physical exam:  Pertinent or positive findings: She is frail. Arcus senilis is present. She has all her own teeth, there is malalignment of the lower anterior teeth. Has minor rales at the bases. All pulses are decreased. Clubbing of nails beds is suggested. She has a constant tremor in right upper extremity. There is dramatic wasting of the interosseous muscles of the right hand particularly between the thumb and index finger. She exhibits cogwheeling & rigidity  with range of motion testing of UEs.  General appearance: no acute distress ,  increased work of breathing is present.   Lymphatic: No lymphadenopathy about the head, neck, axilla . Eyes: No conjunctival inflammation or lid edema is present. There is no scleral icterus. Ears:  External ear exam shows no significant lesions or deformities.   Nose:  External nasal examination shows no deformity or inflammation. Nasal mucosa are pink and moist without lesions ,exudates Oral exam: lips and gums are healthy appearing.There is no oropharyngeal erythema or exudate . Neck:  No thyromegaly, masses, tenderness noted.    Heart:  Normal rate and regular rhythm. S1 and S2 normal without gallop, murmur, click, rub .  Lungs:Chest clear to auscultation without wheezes, rhonchi , rubs. Abdomen:Bowel sounds are normal. Abdomen is soft and nontender with no organomegaly, hernias,masses. GU: deferred  Extremities:  No cyanosis, clubbing,edema  Neurologic exam : Balance,Rhomberg,finger to nose testing could not be completed due to clinical state Skin: Warm & dry w/o tenting. No significant lesions or rash.  See summary under each active problem in the Problem List with associated updated therapeutic plan as well as for each new diagnosis @ this visit

## 2016-10-22 ENCOUNTER — Non-Acute Institutional Stay (SKILLED_NURSING_FACILITY): Payer: Medicare Other

## 2016-10-22 DIAGNOSIS — Z0000 Encounter for general adult medical examination without abnormal findings: Secondary | ICD-10-CM | POA: Diagnosis not present

## 2016-10-22 NOTE — Patient Instructions (Signed)
Ms. Sherri Leonard , Thank you for taking time to come for your Medicare Wellness Visit. I appreciate your ongoing commitment to your health goals. Please review the following plan we discussed and let me know if I can assist you in the future.   Screening recommendations/referrals: Colonoscopy pt over age 81 Mammogram pt over age 975 Bone Density due Recommended yearly ophthalmology/optometry visit for glaucoma screening and checkup Recommended yearly dental visit for hygiene and checkup  Vaccinations: Influenza vaccine due 03/21/2017 Pneumococcal vaccine up to date Tdap vaccine not in records Shingles vaccine not in records  Advanced directives: DNR in chart  Conditions/risks identified: None  Next appointment:None upcoming   Preventive Care 65 Years and Older, Female Preventive care refers to lifestyle choices and visits with your health care provider that can promote health and wellness. What does preventive care include?  A yearly physical exam. This is also called an annual well check.  Dental exams once or twice a year.  Routine eye exams. Ask your health care provider how often you should have your eyes checked.  Personal lifestyle choices, including:  Daily care of your teeth and gums.  Regular physical activity.  Eating a healthy diet.  Avoiding tobacco and drug use.  Limiting alcohol use.  Practicing safe sex.  Taking low-dose aspirin every day.  Taking vitamin and mineral supplements as recommended by your health care provider. What happens during an annual well check? The services and screenings done by your health care provider during your annual well check will depend on your age, overall health, lifestyle risk factors, and family history of disease. Counseling  Your health care provider may ask you questions about your:  Alcohol use.  Tobacco use.  Drug use.  Emotional well-being.  Home and relationship well-being.  Sexual  activity.  Eating habits.  History of falls.  Memory and ability to understand (cognition).  Work and work Astronomerenvironment.  Reproductive health. Screening  You may have the following tests or measurements:  Height, weight, and BMI.  Blood pressure.  Lipid and cholesterol levels. These may be checked every 5 years, or more frequently if you are over 81 years old.  Skin check.  Lung cancer screening. You may have this screening every year starting at age 81 if you have a 30-pack-year history of smoking and currently smoke or have quit within the past 15 years.  Fecal occult blood test (FOBT) of the stool. You may have this test every year starting at age 81.  Flexible sigmoidoscopy or colonoscopy. You may have a sigmoidoscopy every 5 years or a colonoscopy every 10 years starting at age 81.  Hepatitis C blood test.  Hepatitis B blood test.  Sexually transmitted disease (STD) testing.  Diabetes screening. This is done by checking your blood sugar (glucose) after you have not eaten for a while (fasting). You may have this done every 1-3 years.  Bone density scan. This is done to screen for osteoporosis. You may have this done starting at age 81.  Mammogram. This may be done every 1-2 years. Talk to your health care provider about how often you should have regular mammograms. Talk with your health care provider about your test results, treatment options, and if necessary, the need for more tests. Vaccines  Your health care provider may recommend certain vaccines, such as:  Influenza vaccine. This is recommended every year.  Tetanus, diphtheria, and acellular pertussis (Tdap, Td) vaccine. You may need a Td booster every 10 years.  Zoster vaccine. You  may need this after age 85.  Pneumococcal 13-valent conjugate (PCV13) vaccine. One dose is recommended after age 46.  Pneumococcal polysaccharide (PPSV23) vaccine. One dose is recommended after age 94. Talk to your health care  provider about which screenings and vaccines you need and how often you need them. This information is not intended to replace advice given to you by your health care provider. Make sure you discuss any questions you have with your health care provider. Document Released: 06/02/2015 Document Revised: 01/24/2016 Document Reviewed: 03/07/2015 Elsevier Interactive Patient Education  2017 [employer] Prevention in the Home Falls can cause injuries. They can happen to people of all ages. There are many things you can do to make your home safe and to help prevent falls. What can I do on the outside of my home?  Regularly fix the edges of walkways and driveways and fix any cracks.  Remove anything that might make you trip as you walk through a door, such as a raised step or threshold.  Trim any bushes or trees on the path to your home.  Use bright outdoor lighting.  Clear any walking paths of anything that might make someone trip, such as rocks or tools.  Regularly check to see if handrails are loose or broken. Make sure that both sides of any steps have handrails.  Any raised decks and porches should have guardrails on the edges.  Have any leaves, snow, or ice cleared regularly.  Use sand or salt on walking paths during winter.  Clean up any spills in your garage right away. This includes oil or grease spills. What can I do in the bathroom?  Use night lights.  Install grab bars by the toilet and in the tub and shower. Do not use towel bars as grab bars.  Use non-skid mats or decals in the tub or shower.  If you need to sit down in the shower, use a plastic, non-slip stool.  Keep the floor dry. Clean up any water that spills on the floor as soon as it happens.  Remove soap buildup in the tub or shower regularly.  Attach bath mats securely with double-sided non-slip rug tape.  Do not have throw rugs and other things on the floor that can make you trip. What can I do in  the bedroom?  Use night lights.  Make sure that you have a light by your bed that is easy to reach.  Do not use any sheets or blankets that are too big for your bed. They should not hang down onto the floor.  Have a firm chair that has side arms. You can use this for support while you get dressed.  Do not have throw rugs and other things on the floor that can make you trip. What can I do in the kitchen?  Clean up any spills right away.  Avoid walking on wet floors.  Keep items that you use a lot in easy-to-reach places.  If you need to reach something above you, use a strong step stool that has a grab bar.  Keep electrical cords out of the way.  Do not use floor polish or wax that makes floors slippery. If you must use wax, use non-skid floor wax.  Do not have throw rugs and other things on the floor that can make you trip. What can I do with my stairs?  Do not leave any items on the stairs.  Make sure that there are handrails on both  sides of the stairs and use them. Fix handrails that are broken or loose. Make sure that handrails are as long as the stairways.  Check any carpeting to make sure that it is firmly attached to the stairs. Fix any carpet that is loose or worn.  Avoid having throw rugs at the top or bottom of the stairs. If you do have throw rugs, attach them to the floor with carpet tape.  Make sure that you have a light switch at the top of the stairs and the bottom of the stairs. If you do not have them, ask someone to add them for you. What else can I do to help prevent falls?  Wear shoes that:  Do not have high heels.  Have rubber bottoms.  Are comfortable and fit you well.  Are closed at the toe. Do not wear sandals.  If you use a stepladder:  Make sure that it is fully opened. Do not climb a closed stepladder.  Make sure that both sides of the stepladder are locked into place.  Ask someone to hold it for you, if possible.  Clearly mark and  make sure that you can see:  Any grab bars or handrails.  First and last steps.  Where the edge of each step is.  Use tools that help you move around (mobility aids) if they are needed. These include:  Canes.  Walkers.  Scooters.  Crutches.  Turn on the lights when you go into a dark area. Replace any light bulbs as soon as they burn out.  Set up your furniture so you have a clear path. Avoid moving your furniture around.  If any of your floors are uneven, fix them.  If there are any pets around you, be aware of where they are.  Review your medicines with your doctor. Some medicines can make you feel dizzy. This can increase your chance of falling. Ask your doctor what other things that you can do to help prevent falls. This information is not intended to replace advice given to you by your health care provider. Make sure you discuss any questions you have with your health care provider. Document Released: 03/02/2009 Document Revised: 10/12/2015 Document Reviewed: 06/10/2014 Elsevier Interactive Patient Education  2017 [employer].

## 2016-10-22 NOTE — Progress Notes (Signed)
Subjective:   Sherri Leonard is a 83 y.o. female who presents for Medicare Annual (Subsequent) preventive examination at [facility]      Objective:     Vitals: BP 120/70 (BP Location: Right Arm, Patient Position: Sitting)   Pulse 66   Temp 97.7 F (36.5 C) (Oral)   Ht 5\' 1"  (1.549 m)   Wt 148 lb (67.1 kg)   SpO2 94%   BMI 27.96 kg/m   Body mass index is 27.96 kg/m.   Tobacco History  Smoking Status  . Never Smoker  Smokeless Tobacco  . Never Used     Counseling given: Not Answered   Past Medical History:  Diagnosis Date  . Dementia   . OA (osteoarthritis)   . Pacemaker    Past Surgical History:  Procedure Laterality Date  . ATRIAL CARDIAC PACEMAKER INSERTION  2012  . BRAIN SURGERY  2013  . CARPAL TUNNEL RELEASE Bilateral   . FLEXIBLE SIGMOIDOSCOPY N/A 09/12/2014   Procedure: FLEXIBLE SIGMOIDOSCOPY;  Surgeon: Beverley Fiedler, MD;  Location: [facility];  Service: Gastroenterology;  Laterality: N/A;  . KNEE ARTHROPLASTY Bilateral    Family History  Problem Relation Age of Onset  . Heart disease Son   . Cancer Son 60       on Liver  . Alzheimer's disease Mother   . Alzheimer's disease Father   . Heart attack Son   . Colon polyps Neg Hx   . Diabetes Neg Hx   . Kidney disease Neg Hx    History  Sexual Activity  . Sexual activity: No    Outpatient Encounter Prescriptions as of 10/22/2016  Medication Sig  . acetaminophen (TYLENOL) 325 MG tablet Take 2 tablets (650 mg total) by mouth every 6 (six) hours as needed.  . cholecalciferol (VITAMIN D) 1000 UNITS tablet Take 1 tablet (1,000 Units total) by mouth daily.  . Melatonin 3 MG TABS Take 3 mg by mouth.  . meloxicam (MOBIC) 7.5 MG tablet Take 1 tablet (7.5 mg total) by mouth daily.  . naproxen sodium (ANAPROX) 220 MG tablet Take 220 mg by mouth 2 (two) times daily with a meal.  . sertraline (ZOLOFT) 100 MG tablet Take 100 mg by mouth daily.  Marland Kitchen UNABLE TO FIND Med Name: House 2.0 three  times daily 90 mL   No facility-administered encounter medications on file as of 10/22/2016.     Activities of Daily Living In your present state of health, do you have any difficulty performing the following activities: 10/22/2016  Hearing? Y  Vision? N  Difficulty concentrating or making decisions? Y  Walking or climbing stairs? Y  Dressing or bathing? Y  Doing errands, shopping? Y  Preparing Food and eating ? Y  Using the Toilet? Y  In the past six months, have you accidently leaked urine? Y  Do you have problems with loss of bowel control? Y  Managing your Medications? Y  Managing your Finances? Y  Housekeeping or managing your Housekeeping? Y  Some recent data might be hidden    Patient Care Team: Kirt Boys, DO as PCP - [facility] ([facility]    Assessment:   Exercise Activities and Dietary recommendations Current Exercise Habits: The patient does not participate in regular exercise at present, Exercise limited by: neurologic condition(s);orthopedic condition(s)  Goals    . Maintain Lifestyle          Pt will maintain lifestyle.  Fall Risk Fall Risk  10/22/2016 03/27/2015 09/29/2014 09/06/2014  Falls in the past year? No No Yes Yes  Number falls in past yr: - - 2 or more 2 or more  Injury with Fall? - - No -  Risk for fall due to : - - Impaired balance/gait -   Depression Screen PHQ 2/9 Scores 10/22/2016 09/29/2014 09/06/2014  PHQ - 2 Score 0 0 0     Cognitive Function MMSE - Mini Mental State Exam 09/29/2014  Orientation to time 1  Orientation to Place 1  Registration 3  Attention/ Calculation 0  Recall 0  Language- name 2 objects 2  Language- repeat 1  Language- follow 3 step command 2  Language- read & follow direction 0  Write a sentence 0  Copy design 0  Total score 10     6CIT Screen 10/22/2016  What Year? 4 points  What month? 3 points  What time? 3 points  Count back from 20 4  points  Months in reverse 4 points  Repeat phrase 10 points  Total Score 28    Immunization History  Administered Date(s) Administered  . PPD Test 11/16/2014   Screening Tests Health Maintenance  Topic Date Due  . DEXA SCAN  08/02/2043 (Originally 04/24/1988)  . TETANUS/TDAP  08/02/2043 (Originally 04/24/1940)  . PNA vac Low Risk Adult (1 of 2 - PCV13) 08/02/2043 (Originally 04/24/1988)  . INFLUENZA VACCINE  12/18/2016      Plan:    I have personally reviewed and addressed the Medicare Annual Wellness questionnaire and have noted the following in the patient's chart:  A. Medical and social history B. Use of alcohol, tobacco or illicit drugs  C. Current medications and supplements D. Functional ability and status E.  Nutritional status F.  Physical activity G. Advance directives H. List of other physicians I.  Hospitalizations, surgeries, and ER visits in previous 12 months J.  Vitals K. Screenings to include hearing, vision, cognitive, depression L. Referrals and appointments - none  In addition, I have reviewed and discussed with patient certain preventive protocols, quality metrics, and best practice recommendations. A written personalized care plan for preventive services as well as general preventive health recommendations were provided to patient.  See attached scanned questionnaire for additional information.   Signed,   Annetta MawSara Gonthier, RN Nurse Health Advisor   Quick Notes   Health Maintenance: Flu due when available. DEXA due     Abnormal Screen: 6 CIT-28     Patient Concerns: None     Nurse Concerns: None

## 2016-10-29 ENCOUNTER — Other Ambulatory Visit: Payer: Self-pay | Admitting: Nurse Practitioner

## 2016-10-29 DIAGNOSIS — E2839 Other primary ovarian failure: Secondary | ICD-10-CM

## 2016-11-05 ENCOUNTER — Other Ambulatory Visit: Payer: Self-pay | Admitting: Family Medicine

## 2016-11-05 DIAGNOSIS — E2839 Other primary ovarian failure: Secondary | ICD-10-CM

## 2016-11-06 ENCOUNTER — Inpatient Hospital Stay
Admission: RE | Admit: 2016-11-06 | Discharge: 2016-11-06 | Disposition: A | Discharge status: Home / Self Care | Payer: Medicare (Managed Care) | Source: Ambulatory Visit | Attending: Nurse Practitioner | Admitting: Nurse Practitioner

## 2017-04-10 ENCOUNTER — Other Ambulatory Visit: Payer: Self-pay

## 2017-04-10 ENCOUNTER — Encounter (HOSPITAL_COMMUNITY): Payer: Self-pay

## 2017-04-10 ENCOUNTER — Emergency Department (HOSPITAL_COMMUNITY): Payer: Medicare Other

## 2017-04-10 ENCOUNTER — Emergency Department (HOSPITAL_COMMUNITY)
Admission: EM | Admit: 2017-04-10 | Discharge: 2017-04-11 | Disposition: A | Discharge status: Home / Self Care | Payer: Medicare Other | Attending: Emergency Medicine | Admitting: Emergency Medicine

## 2017-04-10 DIAGNOSIS — W050XXA Fall from non-moving wheelchair, initial encounter: Secondary | ICD-10-CM | POA: Insufficient documentation

## 2017-04-10 DIAGNOSIS — Y998 Other external cause status: Secondary | ICD-10-CM | POA: Diagnosis not present

## 2017-04-10 DIAGNOSIS — G309 Alzheimer's disease, unspecified: Secondary | ICD-10-CM | POA: Insufficient documentation

## 2017-04-10 DIAGNOSIS — Z96653 Presence of artificial knee joint, bilateral: Secondary | ICD-10-CM | POA: Diagnosis not present

## 2017-04-10 DIAGNOSIS — Z950 Presence of cardiac pacemaker: Secondary | ICD-10-CM | POA: Insufficient documentation

## 2017-04-10 DIAGNOSIS — W19XXXA Unspecified fall, initial encounter: Secondary | ICD-10-CM

## 2017-04-10 DIAGNOSIS — Y92129 Unspecified place in nursing home as the place of occurrence of the external cause: Secondary | ICD-10-CM | POA: Diagnosis not present

## 2017-04-10 DIAGNOSIS — Y9389 Activity, other specified: Secondary | ICD-10-CM | POA: Insufficient documentation

## 2017-04-10 DIAGNOSIS — S0003XA Contusion of scalp, initial encounter: Secondary | ICD-10-CM | POA: Diagnosis not present

## 2017-04-10 DIAGNOSIS — Z79899 Other long term (current) drug therapy: Secondary | ICD-10-CM | POA: Insufficient documentation

## 2017-04-10 DIAGNOSIS — S0990XA Unspecified injury of head, initial encounter: Secondary | ICD-10-CM | POA: Diagnosis present

## 2017-04-10 NOTE — ED Triage Notes (Signed)
Pt. From [facility]. Pt. Fell unattended and had a hematoma to the right forehead. No LOC . Pt. Is aphasic. No blood thinners. DNR

## 2017-04-10 NOTE — ED Provider Notes (Signed)
[facility] EM[facility]GENCY [facility]PART[facility] Provider Note   CSN: [id number] Arrival date & time: 04/10/17  1927     History   Chief Complaint Chief Complaint  Patient presents with  . Fall    HPI Sherri Leonard is a 83 y.o. female.  The history is provided by the EMS personnel and a relative. No language interpreter was used.    Sherri Leonard is a 83 y.o. female who presents to the Emergency Department complaining of fall.  Level V caveat due to dementia.  Hx is provided by EMS and nursing facility.  She was sitting in a wheelchair unattended and had an unwitnessed fall, striking her head.  She screamed right away.  She has dementia and aphasia at baseline.  She does not take any blood thinners.   Past Medical History:  Diagnosis Date  . Dementia   . OA (osteoarthritis)   . Pacemaker     Patient Active Problem List   Diagnosis Date Noted  . Osteoarthritis 08/28/2016  . Presence of permanent cardiac pacemaker 05/27/2016  . Essential hypertension, benign 09/07/2015  . Arthritis 05/03/2015  . Alzheimer's disease 04/10/2015  . Parkinsonian features 04/10/2015  . Depression due to dementia 04/10/2015  . Anemia 04/06/2015  . Hyperglycemia 04/06/2015  . Sick sinus syndrome (HCC) 05/06/2013    Past Surgical History:  Procedure Laterality Date  . ATRIAL CARDIAC PACEMAKER INSERTION  2012  . BRAIN SURGERY  2013  . CARPAL TUNNEL RELEASE Bilateral   . FLEXIBLE SIGMOIDOSCOPY N/A 09/12/2014   Procedure: FLEXIBLE SIGMOIDOSCOPY;  Surgeon: Beverley FiedlerJay M Pyrtle, MD;  Location: [facility];  Service: Gastroenterology;  Laterality: N/A;  . KNEE ARTHROPLASTY Bilateral     OB History    No data available       Home Medications    Prior to Admission medications   Medication Sig Start Date End Date Taking? Authorizing Provider  acetaminophen (TYLENOL) 325 MG tablet Take 2 tablets (650 mg total) by mouth every 6 (six) hours as needed. 08/28/16   [facility] Lawless[facility]pper, William F, MD    cholecalciferol (VITAMIN D) 1000 UNITS tablet Take 1 tablet (1,000 Units total) by mouth daily. 03/30/15   Sharon SellerEubanks, Jessica K, NP  Melatonin 3 MG TABS Take 3 mg by mouth.    [provider]  meloxicam (MOBIC) 7.5 MG tablet Take 1 tablet (7.5 mg total) by mouth daily. 08/28/16   [facility] LawlessHopper, William F, MD  naproxen sodium (ANAPROX) 220 MG tablet Take 220 mg by mouth 2 (two) times daily with a meal.    [provider]  sertraline (ZOLOFT) 100 MG tablet Take 100 mg by mouth daily.    [provider]  UNABLE TO FIND Med Name: House 2.0 three times daily 90 mL    [provider]    Family History Family History  Problem Relation Age of Onset  . Heart disease Son   . Cancer Son 60       on Liver  . Alzheimer's disease Mother   . Alzheimer's disease Father   . Heart attack Son   . Colon polyps Neg Hx   . Diabetes Neg Hx   . Kidney disease Neg Hx     Social History Social History   Tobacco Use  . Smoking status: Never Smoker  . Smokeless tobacco: Never Used  Substance Use Topics  . Alcohol use: No    Alcohol/week: 0.0 oz  . Drug use: No     Allergies   Ativan [lorazepam] and  Benzodiazepines   Review of Systems Review of Systems  All other systems reviewed and are negative.    Physical Exam Updated Vital Signs BP (!) 100/52   Pulse 65   Temp 98.8 F (37.1 C) (Oral)   Resp 18   SpO2 99%   Physical Exam  Constitutional: She appears well-developed and well-nourished.  HENT:  Head: Normocephalic.  Large hematoma to the right forehead  Cardiovascular: Normal rate and regular rhythm.  No murmur heard. Pulmonary/Chest: Effort normal and breath sounds normal. No respiratory distress.  Abdominal: Soft. There is no tenderness. There is no rebound and no guarding.  Musculoskeletal: She exhibits no edema or tenderness.  Contractures of all 4 extremities  Neurological:  Alert, moans but is not able to articulate.  Very weakly moves all 4  extremities  Skin: Skin is warm and dry.  Psychiatric: She has a normal mood and affect. Her behavior is normal.  Nursing note and vitals reviewed.    ED Treatments / Results  Labs (all labs ordered are listed, but only abnormal results are displayed) Labs Reviewed - No data to display  EKG  EKG Interpretation None       Radiology Ct Head Wo Contrast  Result Date: 04/10/2017 CLINICAL DATA:  Fall EXAM: CT HEAD WITHOUT CONTRAST TECHNIQUE: Contiguous axial images were obtained from the base of the skull through the vertex without intravenous contrast. COMPARISON:  Head CT 11/17/ 2016 FINDINGS: The examination is degraded by poor signal to noise ratio due to difficulties with patient positioning, requiring increased field of view. Brain: No mass lesion, intraparenchymal hemorrhage or extra-axial collection. No evidence of acute cortical infarct. There is generalized atrophy of the brain. There is periventricular hypoattenuation compatible with chronic microvascular disease. Vascular: No hyperdense vessel or unexpected calcification. Skull: There is a large left frontal scalp hematoma. No associated skull fracture. Sinuses/Orbits: There is complete opacification of the right maxillary sinus, right frontal sinus and anterior right ethmoid air cells. Normal orbits. IMPRESSION: 1. Technically degraded study due to difficulty with patient positioning and cooperation. The sensitivity of the study is decreased. 2. No acute intracranial abnormality. 3. Chronic ischemic microangiopathy and generalized volume loss. 4. Large left frontal scalp hematoma without skull fracture. 5. Chronic right ostiomeatal unit pattern sinusitis. Electronically Signed   By: Deatra RobinsonKevin  Herman M.D.   On: 04/10/2017 22:29    Procedures Procedures (including critical care time)  Medications Ordered in ED Medications - No data to display   Initial Impression / Assessment and Plan / ED Course  I have reviewed the triage vital  signs and the nursing notes.  Pertinent labs & imaging results that were available during my care of the patient were reviewed by me and considered in my medical decision making (see chart for details).     Patient here for evaluation of injuries following a fall out of a wheelchair.  She does have a large hematoma on examination.  She does not appear to have any tenderness outside of her scalp hematoma.  Unable to obtain C-spine imaging due to her contractures.  She does not have any apparent tenderness on palpation on examination outside of her scalp.  Plan to discharge back to nursing facility for ongoing care.  Final Clinical Impressions(s) / ED Diagnoses   Final diagnoses:  Fall, initial encounter  Contusion of scalp, initial encounter    ED Discharge Orders    None       Tilden Fossaees, Johncharles Fusselman, MD 04/11/17 [phone]

## 2017-04-10 NOTE — ED Notes (Signed)
Pt. Return from CT via stretcher. 

## 2017-04-10 NOTE — ED Notes (Signed)
CT unable to be completed due to Pt. Moving. MD notified.

## 2017-04-11 NOTE — ED Notes (Signed)
PTAR contacted to tx patient back to [facility]

## 2017-04-11 NOTE — ED Notes (Signed)
Pt. Unable to sign.

## 2017-04-23 ENCOUNTER — Telehealth: Payer: Self-pay | Admitting: Internal Medicine

## 2017-04-23 ENCOUNTER — Encounter: Payer: Self-pay | Admitting: Internal Medicine

## 2017-04-23 ENCOUNTER — Ambulatory Visit (INDEPENDENT_AMBULATORY_CARE_PROVIDER_SITE_OTHER): Payer: Medicare Other | Admitting: Internal Medicine

## 2017-04-23 VITALS — BP 100/68 | HR 72 | Ht 61.0 in

## 2017-04-23 DIAGNOSIS — I495 Sick sinus syndrome: Secondary | ICD-10-CM

## 2017-04-23 DIAGNOSIS — Z950 Presence of cardiac pacemaker: Secondary | ICD-10-CM | POA: Diagnosis not present

## 2017-04-23 NOTE — Telephone Encounter (Signed)
CVM left for granddaughter. Notified that Dr. Ladona Ridgelaylor will be assuming care of patient's pacemaker.  Left this nurse name and # if any further questions, nothing further to add at this time.  Pt to be seen for first time this afternoon.

## 2017-04-23 NOTE — Progress Notes (Signed)
HPI Sherri Leonard is referred today for ongoing evaluation and management of her pacemaker.  She is a 83 year old woman with a history of symptomatic sinus node dysfunction, and severe dementia, who underwent pacemaker insertion in 2011.  [place] is [place] moved from [place] this area.  The patient is unable to give any history, and cannot tell me whether she has any children or not.  Her caretaker notes one granddaughter. Allergies  Allergen Reactions  . Ativan [Lorazepam] Other (See Comments)    Causes violence   . Benzodiazepines      Current Outpatient Medications  Medication Sig Dispense Refill  . acetaminophen (TYLENOL) 325 MG tablet Take 2 tablets (650 mg total) by mouth every 6 (six) hours as needed.    . cholecalciferol (VITAMIN D) 1000 UNITS tablet Take 1 tablet (1,000 Units total) by mouth daily. 30 tablet 3  . Melatonin 3 MG TABS Take 3 mg by mouth.    . meloxicam (MOBIC) 7.5 MG tablet Take 1 tablet (7.5 mg total) by mouth daily. 30 tablet 0  . naproxen sodium (ANAPROX) 220 MG tablet Take 220 mg by mouth 2 (two) times daily with a meal.    . sertraline (ZOLOFT) 100 MG tablet Take 100 mg by mouth daily.    Marland Kitchen. UNABLE TO FIND Med Name: House 2.0 three times daily 90 mL     No current facility-administered medications for this visit.      Past Medical History:  Diagnosis Date  . Dementia   . OA (osteoarthritis)   . Pacemaker     ROS:   All systems reviewed and negative except as noted in the HPI.   Past Surgical History:  Procedure Laterality Date  . ATRIAL CARDIAC PACEMAKER INSERTION  2012  . BRAIN SURGERY  2013  . CARPAL TUNNEL RELEASE Bilateral   . FLEXIBLE SIGMOIDOSCOPY N/A 09/12/2014   Procedure: FLEXIBLE SIGMOIDOSCOPY;  Surgeon: Beverley FiedlerJay M Pyrtle, MD;  Location: [facility];  Service: Gastroenterology;  Laterality: N/A;  . KNEE ARTHROPLASTY Bilateral      Family History  Problem Relation Age of Onset  . Heart disease Son   . Cancer Son 60    on Liver  . Alzheimer's disease Mother   . Alzheimer's disease Father   . Heart attack Son   . Colon polyps Neg Hx   . Diabetes Neg Hx   . Kidney disease Neg Hx      Social History   Socioeconomic History  . Marital status: Widowed    Spouse name: Not on file  . Number of children: 1  . Years of education: Not on file  . Highest education level: Not on file  Social Needs  . Financial resource strain: Not on file  . Food insecurity - worry: Not on file  . Food insecurity - inability: Not on file  . Transportation needs - medical: Not on file  . Transportation needs - non-medical: Not on file  Occupational History  . Occupation: retired  Tobacco Use  . Smoking status: Never Smoker  . Smokeless tobacco: Never Used  Substance and Sexual Activity  . Alcohol use: No    Alcohol/week: 0.0 oz  . Drug use: No  . Sexual activity: No  Other Topics Concern  . Not on file  Social History Narrative   Diet:   Do you drink/eat things with caffeine? No   Marital status:     Widowed  What year were you married?   Do you live in a house, apartment, assisted living, condo, trailer, etc)? Home   Is it one or more stories? One   How many persons live in your home? Two   Do you have any pets in your home? No   Current or past profession:   Do you exercise?      No                                              Type & how often:   Do you have a living will? Yes   Do you have a DNR Form? Yes   Do you have a POA/HPOA forms? Yes     BP 100/68   Pulse 72   Ht 5\' 1"  (1.549 m)   SpO2 97%   BMI 27.96 kg/m   Physical Exam:  Chronically ill appearing elderly woman, NAD HEENT: Unremarkable Neck:   7 cm  JVD, no thyromegally Lymphatics:  No adenopathy Back:  No CVA tenderness Lungs:  Clear, with no wheezes, rales, or rhonchi HEART:  Regular rate rhythm, no murmurs, no rubs, no clicks Abd:  soft, positive bowel sounds, no organomegally, no rebound, no  guarding Ext:  2 plus pulses, no edema, no cyanosis, no clubbing Skin:  No rashes no nodules Neuro:  CN II through XII intact, motor grossly intact  EKG sinus rhythm with right bundle branch block and atrial pacing   DEVICE  Normal device function.  See PaceArt for details.   Assess/Plan: 1.  Sinus node dysfunction -she is status post pacemaker insertion. 2.  Dementia -the patient cannot give any history, and her dementia is fairly profound.  She will continue her medications. 3.  Pacemaker -interrogation of her Medtronic dual-chamber pacemaker demonstrates normal device function.  We will plan to see her back in several months.  Sharrell KuGreg Taylor, MD

## 2017-04-23 NOTE — Progress Notes (Signed)
EKG

## 2017-04-23 NOTE — Patient Instructions (Signed)
Medication Instructions:  Your physician recommends that you continue on your current medications as directed. Please refer to the Current Medication list given to you today.  Labwork: None ordered.  Testing/Procedures: None ordered.  Follow-Up: Your physician wants you to follow-up in: device clinic in 6 months for a device check.   You will receive a reminder letter in the mail two months in advance. If you don't receive a letter, please call our office to schedule the follow-up appointment.  Your physician wants you to follow-up in: one year with Dr. Ladona Ridgelaylor.   You will receive a reminder letter in the mail two months in advance. If you don't receive a letter, please call our office to schedule the follow-up appointment.   Any Other Special Instructions Will Be Listed Below (If Applicable).  If you need a refill on your cardiac medications before your next appointment, please call your pharmacy.

## 2017-04-23 NOTE — Telephone Encounter (Signed)
New Message  Pt granddaughter called requesting to speak with RN to get an over view of pts appt that will be today at 2:30. The home the pt is living in did not call here to inform her of the pts appt. So she will not be in attendance. Please call back to discuss

## 2017-04-24 NOTE — Telephone Encounter (Signed)
No call back received from granddaughter.  Will await further needs.

## 2017-04-30 LAB — CUP PACEART INCLINIC DEVICE CHECK
Battery Impedance: 1025 Ohm
Battery Remaining Longevity: 68 mo
Battery Voltage: 2.77 V
Brady Statistic AP VP Percent: 0 %
Brady Statistic AS VP Percent: 0 %
Date Time Interrogation Session: 20181205195123
Implantable Lead Implant Date: 20110525
Implantable Lead Model: 4092
Implantable Lead Model: 4592
Implantable Pulse Generator Implant Date: 20110525
Lead Channel Impedance Value: 530 Ohm
Lead Channel Impedance Value: 562 Ohm
Lead Channel Pacing Threshold Amplitude: 0.75 V
Lead Channel Pacing Threshold Pulse Width: 0.4 ms
Lead Channel Setting Pacing Amplitude: 2 V
MDC IDC LEAD IMPLANT DT: 20110525
MDC IDC LEAD LOCATION: 753859
MDC IDC LEAD LOCATION: 753860
MDC IDC MSMT LEADCHNL RA SENSING INTR AMPL: 0.7 mV
MDC IDC MSMT LEADCHNL RV PACING THRESHOLD AMPLITUDE: 0.75 V
MDC IDC MSMT LEADCHNL RV PACING THRESHOLD PULSEWIDTH: 0.4 ms
MDC IDC MSMT LEADCHNL RV SENSING INTR AMPL: 5.6 mV
MDC IDC SET LEADCHNL RV PACING AMPLITUDE: 2.5 V
MDC IDC SET LEADCHNL RV PACING PULSEWIDTH: 0.4 ms
MDC IDC SET LEADCHNL RV SENSING SENSITIVITY: 2 mV
MDC IDC STAT BRADY AP VS PERCENT: 21 %
MDC IDC STAT BRADY AS VS PERCENT: 78 %

## 2017-09-02 ENCOUNTER — Inpatient Hospital Stay (HOSPITAL_COMMUNITY): Payer: Medicare Other

## 2017-09-02 ENCOUNTER — Inpatient Hospital Stay (HOSPITAL_COMMUNITY): Payer: Medicare Other | Admitting: Anesthesiology

## 2017-09-02 ENCOUNTER — Encounter (HOSPITAL_COMMUNITY)
Admission: EM | Disposition: A | Discharge status: Skilled Nursing Facility | Payer: Self-pay | Source: Home / Self Care | Attending: Internal Medicine

## 2017-09-02 ENCOUNTER — Other Ambulatory Visit: Payer: Self-pay

## 2017-09-02 ENCOUNTER — Emergency Department (HOSPITAL_COMMUNITY): Payer: Medicare Other

## 2017-09-02 ENCOUNTER — Inpatient Hospital Stay (HOSPITAL_COMMUNITY)
Admission: EM | Admit: 2017-09-02 | Discharge: 2017-09-08 | DRG: 470 | Disposition: A | Discharge status: Skilled Nursing Facility | Payer: Medicare Other | Attending: Internal Medicine | Admitting: Internal Medicine

## 2017-09-02 ENCOUNTER — Encounter (HOSPITAL_COMMUNITY): Payer: Self-pay | Admitting: Emergency Medicine

## 2017-09-02 DIAGNOSIS — F0390 Unspecified dementia without behavioral disturbance: Secondary | ICD-10-CM | POA: Diagnosis not present

## 2017-09-02 DIAGNOSIS — M1990 Unspecified osteoarthritis, unspecified site: Secondary | ICD-10-CM | POA: Diagnosis present

## 2017-09-02 DIAGNOSIS — Z888 Allergy status to other drugs, medicaments and biological substances status: Secondary | ICD-10-CM | POA: Diagnosis not present

## 2017-09-02 DIAGNOSIS — N179 Acute kidney failure, unspecified: Secondary | ICD-10-CM | POA: Diagnosis present

## 2017-09-02 DIAGNOSIS — I959 Hypotension, unspecified: Secondary | ICD-10-CM | POA: Diagnosis present

## 2017-09-02 DIAGNOSIS — Z515 Encounter for palliative care: Secondary | ICD-10-CM | POA: Diagnosis not present

## 2017-09-02 DIAGNOSIS — F419 Anxiety disorder, unspecified: Secondary | ICD-10-CM | POA: Diagnosis present

## 2017-09-02 DIAGNOSIS — Y92129 Unspecified place in nursing home as the place of occurrence of the external cause: Secondary | ICD-10-CM | POA: Diagnosis not present

## 2017-09-02 DIAGNOSIS — D62 Acute posthemorrhagic anemia: Secondary | ICD-10-CM | POA: Diagnosis not present

## 2017-09-02 DIAGNOSIS — R1310 Dysphagia, unspecified: Secondary | ICD-10-CM | POA: Diagnosis present

## 2017-09-02 DIAGNOSIS — Z79899 Other long term (current) drug therapy: Secondary | ICD-10-CM

## 2017-09-02 DIAGNOSIS — Z993 Dependence on wheelchair: Secondary | ICD-10-CM | POA: Diagnosis not present

## 2017-09-02 DIAGNOSIS — R4781 Slurred speech: Secondary | ICD-10-CM | POA: Diagnosis present

## 2017-09-02 DIAGNOSIS — Z950 Presence of cardiac pacemaker: Secondary | ICD-10-CM

## 2017-09-02 DIAGNOSIS — R197 Diarrhea, unspecified: Secondary | ICD-10-CM | POA: Diagnosis present

## 2017-09-02 DIAGNOSIS — Z820 Family history of epilepsy and other diseases of the nervous system: Secondary | ICD-10-CM

## 2017-09-02 DIAGNOSIS — M25552 Pain in left hip: Secondary | ICD-10-CM

## 2017-09-02 DIAGNOSIS — Z79891 Long term (current) use of opiate analgesic: Secondary | ICD-10-CM

## 2017-09-02 DIAGNOSIS — Z7189 Other specified counseling: Secondary | ICD-10-CM | POA: Diagnosis not present

## 2017-09-02 DIAGNOSIS — S72002A Fracture of unspecified part of neck of left femur, initial encounter for closed fracture: Principal | ICD-10-CM | POA: Diagnosis present

## 2017-09-02 DIAGNOSIS — Z96649 Presence of unspecified artificial hip joint: Secondary | ICD-10-CM

## 2017-09-02 DIAGNOSIS — Z8249 Family history of ischemic heart disease and other diseases of the circulatory system: Secondary | ICD-10-CM

## 2017-09-02 DIAGNOSIS — W050XXA Fall from non-moving wheelchair, initial encounter: Secondary | ICD-10-CM | POA: Diagnosis present

## 2017-09-02 DIAGNOSIS — D72829 Elevated white blood cell count, unspecified: Secondary | ICD-10-CM | POA: Diagnosis present

## 2017-09-02 DIAGNOSIS — Z96653 Presence of artificial knee joint, bilateral: Secondary | ICD-10-CM | POA: Diagnosis present

## 2017-09-02 HISTORY — PX: HIP ARTHROPLASTY: SHX981

## 2017-09-02 LAB — CBC WITH DIFFERENTIAL/PLATELET
BASOS ABS: 0 10*3/uL (ref 0.0–0.1)
BASOS PCT: 0 %
Eosinophils Absolute: 0.1 10*3/uL (ref 0.0–0.7)
Eosinophils Relative: 0 %
HEMATOCRIT: 29 % — AB (ref 36.0–46.0)
Hemoglobin: 9.2 g/dL — ABNORMAL LOW (ref 12.0–15.0)
Lymphocytes Relative: 12 %
Lymphs Abs: 1.7 10*3/uL (ref 0.7–4.0)
MCH: 30.1 pg (ref 26.0–34.0)
MCHC: 31.7 g/dL (ref 30.0–36.0)
MCV: 94.8 fL (ref 78.0–100.0)
MONO ABS: 0.9 10*3/uL (ref 0.1–1.0)
Monocytes Relative: 6 %
NEUTROS ABS: 12 10*3/uL — AB (ref 1.7–7.7)
NEUTROS PCT: 82 %
Platelets: 341 10*3/uL (ref 150–400)
RBC: 3.06 MIL/uL — ABNORMAL LOW (ref 3.87–5.11)
RDW: 14.8 % (ref 11.5–15.5)
WBC: 14.7 10*3/uL — AB (ref 4.0–10.5)

## 2017-09-02 LAB — PROTIME-INR
INR: 1.14
Prothrombin Time: 14.5 seconds (ref 11.4–15.2)

## 2017-09-02 LAB — ABO/RH: ABO/RH(D): O POS

## 2017-09-02 LAB — BASIC METABOLIC PANEL
ANION GAP: 10 (ref 5–15)
BUN: 37 mg/dL — ABNORMAL HIGH (ref 6–20)
CALCIUM: 8.8 mg/dL — AB (ref 8.9–10.3)
CO2: 24 mmol/L (ref 22–32)
Chloride: 110 mmol/L (ref 101–111)
Creatinine, Ser: 1.2 mg/dL — ABNORMAL HIGH (ref 0.44–1.00)
GFR, EST AFRICAN AMERICAN: 43 mL/min — AB (ref >60)
GFR, EST NON AFRICAN AMERICAN: 37 mL/min — AB (ref >60)
GLUCOSE: 169 mg/dL — AB (ref 65–99)
Potassium: 4.2 mmol/L (ref 3.5–5.1)
SODIUM: 144 mmol/L (ref 135–145)

## 2017-09-02 LAB — TROPONIN I: Troponin I: 0.04 ng/mL (ref <0.03)

## 2017-09-02 LAB — MRSA PCR SCREENING: MRSA BY PCR: NEGATIVE

## 2017-09-02 SURGERY — HEMIARTHROPLASTY, HIP, DIRECT ANTERIOR APPROACH, FOR FRACTURE
Anesthesia: General | Site: Hip | Laterality: Left

## 2017-09-02 MED ORDER — FERROUS SULFATE 325 (65 FE) MG PO TABS: 325.0000 mg | ORAL_TABLET | Freq: Three times a day (TID) | ORAL | Status: DC

## 2017-09-02 MED ORDER — SODIUM CHLORIDE 0.9% FLUSH: 3.0000 mL | INTRAVENOUS | Status: DC | PRN

## 2017-09-02 MED ORDER — LACTATED RINGERS IV SOLN
INTRAVENOUS | Status: DC
  Administered 2017-09-02 (×2): via INTRAVENOUS

## 2017-09-02 MED ORDER — ROCURONIUM BROMIDE 10 MG/ML (PF) SYRINGE
PREFILLED_SYRINGE | INTRAVENOUS | Status: DC | PRN
  Administered 2017-09-02: 40 mg via INTRAVENOUS

## 2017-09-02 MED ORDER — MENTHOL 3 MG MT LOZG
1.0000 | LOZENGE | OROMUCOSAL | Status: DC | PRN
  Filled 2017-09-02: qty 9

## 2017-09-02 MED ORDER — ONDANSETRON HCL 4 MG PO TABS: 4.0000 mg | ORAL_TABLET | Freq: Four times a day (QID) | ORAL | Status: DC | PRN

## 2017-09-02 MED ORDER — FENTANYL CITRATE (PF) 100 MCG/2ML IJ SOLN
25.0000 ug | Freq: Once | INTRAMUSCULAR | Status: AC
  Administered 2017-09-02: 25 ug via INTRAVENOUS
  Filled 2017-09-02: qty 2

## 2017-09-02 MED ORDER — ACETAMINOPHEN 325 MG PO TABS: 650.0000 mg | ORAL_TABLET | Freq: Four times a day (QID) | ORAL | Status: DC | PRN

## 2017-09-02 MED ORDER — CEFAZOLIN SODIUM-DEXTROSE 2-4 GM/100ML-% IV SOLN
INTRAVENOUS | Status: AC
Start: 2017-09-02 — End: 2017-09-02
  Filled 2017-09-02: qty 100

## 2017-09-02 MED ORDER — ALBUMIN HUMAN 5 % IV SOLN
INTRAVENOUS | Status: AC
  Filled 2017-09-02: qty 250

## 2017-09-02 MED ORDER — SENNA 8.6 MG PO TABS: 1.0000 | ORAL_TABLET | Freq: Two times a day (BID) | ORAL | Status: DC

## 2017-09-02 MED ORDER — CEFAZOLIN SODIUM-DEXTROSE 2-4 GM/100ML-% IV SOLN
2.0000 g | INTRAVENOUS | Status: AC
  Administered 2017-09-02: 2 g via INTRAVENOUS

## 2017-09-02 MED ORDER — ONDANSETRON HCL 4 MG/2ML IJ SOLN
INTRAMUSCULAR | Status: AC
  Filled 2017-09-02: qty 2

## 2017-09-02 MED ORDER — FENTANYL CITRATE (PF) 100 MCG/2ML IJ SOLN: 50.0000 ug | INTRAMUSCULAR | Status: DC

## 2017-09-02 MED ORDER — ONDANSETRON HCL 4 MG/2ML IJ SOLN: 4.0000 mg | Freq: Four times a day (QID) | INTRAMUSCULAR | Status: DC | PRN

## 2017-09-02 MED ORDER — SODIUM CHLORIDE 0.9 % IV SOLN: 250.0000 mL | INTRAVENOUS | Status: DC | PRN

## 2017-09-02 MED ORDER — SODIUM CHLORIDE 0.9 % IV BOLUS
500.0000 mL | Freq: Once | INTRAVENOUS | Status: AC
  Administered 2017-09-02: 500 mL via INTRAVENOUS

## 2017-09-02 MED ORDER — 0.9 % SODIUM CHLORIDE (POUR BTL) OPTIME
TOPICAL | Status: DC | PRN
  Administered 2017-09-02: 1000 mL

## 2017-09-02 MED ORDER — DEXTROSE-NACL 5-0.45 % IV SOLN
INTRAVENOUS | Status: DC
  Administered 2017-09-02 – 2017-09-08 (×8): via INTRAVENOUS

## 2017-09-02 MED ORDER — HEPARIN SODIUM (PORCINE) 5000 UNIT/ML IJ SOLN: 5000.0000 [IU] | Freq: Three times a day (TID) | INTRAMUSCULAR | Status: DC

## 2017-09-02 MED ORDER — ACETAMINOPHEN 650 MG RE SUPP: 650.0000 mg | Freq: Four times a day (QID) | RECTAL | Status: DC | PRN

## 2017-09-02 MED ORDER — SUGAMMADEX SODIUM 200 MG/2ML IV SOLN
INTRAVENOUS | Status: AC
  Filled 2017-09-02: qty 2

## 2017-09-02 MED ORDER — POLYETHYLENE GLYCOL 3350 17 G PO PACK: 17.0000 g | PACK | Freq: Every day | ORAL | Status: DC | PRN

## 2017-09-02 MED ORDER — CHLORHEXIDINE GLUCONATE 4 % EX LIQD
60.0000 mL | Freq: Once | CUTANEOUS | Status: DC
  Filled 2017-09-02: qty 60

## 2017-09-02 MED ORDER — FENTANYL CITRATE (PF) 100 MCG/2ML IJ SOLN: 25.0000 ug | INTRAMUSCULAR | Status: DC | PRN

## 2017-09-02 MED ORDER — OXYCODONE HCL 5 MG/5ML PO SOLN
5.0000 mg | Freq: Once | ORAL | Status: DC | PRN
  Filled 2017-09-02: qty 5

## 2017-09-02 MED ORDER — SUGAMMADEX SODIUM 200 MG/2ML IV SOLN
INTRAVENOUS | Status: DC | PRN
  Administered 2017-09-02: 140 mg via INTRAVENOUS

## 2017-09-02 MED ORDER — PHENYLEPHRINE 40 MCG/ML (10ML) SYRINGE FOR IV PUSH (FOR BLOOD PRESSURE SUPPORT)
PREFILLED_SYRINGE | INTRAVENOUS | Status: DC | PRN
  Administered 2017-09-02 (×3): 80 ug via INTRAVENOUS

## 2017-09-02 MED ORDER — OXYCODONE HCL 5 MG PO TABS: 5.0000 mg | ORAL_TABLET | Freq: Once | ORAL | Status: DC | PRN

## 2017-09-02 MED ORDER — SODIUM CHLORIDE 0.9 % IV BOLUS: 250.0000 mL | Freq: Once | INTRAVENOUS | Status: DC

## 2017-09-02 MED ORDER — DEXAMETHASONE SODIUM PHOSPHATE 10 MG/ML IJ SOLN
INTRAMUSCULAR | Status: DC | PRN
  Administered 2017-09-02: 6 mg via INTRAVENOUS

## 2017-09-02 MED ORDER — FENTANYL CITRATE (PF) 100 MCG/2ML IJ SOLN
INTRAMUSCULAR | Status: AC
  Administered 2017-09-02 (×2): 25 ug
  Filled 2017-09-02: qty 2

## 2017-09-02 MED ORDER — CEFAZOLIN SODIUM-DEXTROSE 2-4 GM/100ML-% IV SOLN
2.0000 g | Freq: Four times a day (QID) | INTRAVENOUS | Status: AC
  Administered 2017-09-02 – 2017-09-03 (×2): 2 g via INTRAVENOUS
  Filled 2017-09-02 (×2): qty 100

## 2017-09-02 MED ORDER — ALBUTEROL SULFATE (2.5 MG/3ML) 0.083% IN NEBU: 2.5000 mg | INHALATION_SOLUTION | RESPIRATORY_TRACT | Status: DC | PRN

## 2017-09-02 MED ORDER — PHENYLEPHRINE 40 MCG/ML (10ML) SYRINGE FOR IV PUSH (FOR BLOOD PRESSURE SUPPORT)
PREFILLED_SYRINGE | INTRAVENOUS | Status: AC
  Filled 2017-09-02: qty 10

## 2017-09-02 MED ORDER — PHENOL 1.4 % MT LIQD: 1.0000 | OROMUCOSAL | Status: DC | PRN

## 2017-09-02 MED ORDER — METOCLOPRAMIDE HCL 5 MG/ML IJ SOLN: 5.0000 mg | Freq: Three times a day (TID) | INTRAMUSCULAR | Status: DC | PRN

## 2017-09-02 MED ORDER — PROPOFOL 10 MG/ML IV BOLUS
INTRAVENOUS | Status: AC
  Filled 2017-09-02: qty 20

## 2017-09-02 MED ORDER — ALUM & MAG HYDROXIDE-SIMETH 200-200-20 MG/5ML PO SUSP: 30.0000 mL | ORAL | Status: DC | PRN

## 2017-09-02 MED ORDER — ACETAMINOPHEN 325 MG PO TABS
650.0000 mg | ORAL_TABLET | Freq: Four times a day (QID) | ORAL | Status: DC | PRN
  Administered 2017-09-07: 650 mg via ORAL
  Filled 2017-09-02: qty 2

## 2017-09-02 MED ORDER — DOCUSATE SODIUM 100 MG PO CAPS: 100.0000 mg | ORAL_CAPSULE | Freq: Two times a day (BID) | ORAL | Status: DC

## 2017-09-02 MED ORDER — SERTRALINE HCL 50 MG PO TABS: 125.0000 mg | ORAL_TABLET | Freq: Every day | ORAL | Status: DC

## 2017-09-02 MED ORDER — ACETAMINOPHEN 650 MG RE SUPP
650.0000 mg | Freq: Four times a day (QID) | RECTAL | Status: DC | PRN
  Administered 2017-09-02 – 2017-09-03 (×2): 650 mg via RECTAL
  Filled 2017-09-02 (×2): qty 1

## 2017-09-02 MED ORDER — FENTANYL CITRATE (PF) 100 MCG/2ML IJ SOLN
INTRAMUSCULAR | Status: AC
  Filled 2017-09-02: qty 2

## 2017-09-02 MED ORDER — ENOXAPARIN SODIUM 40 MG/0.4ML ~~LOC~~ SOLN: 40.0000 mg | SUBCUTANEOUS | Status: DC

## 2017-09-02 MED ORDER — POVIDONE-IODINE 10 % EX SWAB: 2.0000 "application " | Freq: Once | CUTANEOUS | Status: DC

## 2017-09-02 MED ORDER — ASPIRIN EC 325 MG PO TBEC: 325.0000 mg | DELAYED_RELEASE_TABLET | Freq: Two times a day (BID) | ORAL | Status: DC

## 2017-09-02 MED ORDER — STERILE WATER FOR IRRIGATION IR SOLN
Status: DC | PRN
  Administered 2017-09-02: 1000 mL

## 2017-09-02 MED ORDER — SODIUM CHLORIDE 0.9% FLUSH
3.0000 mL | Freq: Two times a day (BID) | INTRAVENOUS | Status: DC
  Administered 2017-09-02 – 2017-09-06 (×7): 3 mL via INTRAVENOUS

## 2017-09-02 MED ORDER — PROPOFOL 10 MG/ML IV BOLUS
INTRAVENOUS | Status: DC | PRN
  Administered 2017-09-02: 60 mg via INTRAVENOUS

## 2017-09-02 MED ORDER — DEXTROSE 5 % IV SOLN
INTRAVENOUS | Status: DC | PRN
  Administered 2017-09-02: 50 ug/min via INTRAVENOUS

## 2017-09-02 MED ORDER — SODIUM CHLORIDE 0.9 % IV SOLN
INTRAVENOUS | Status: DC
  Administered 2017-09-02: 17:00:00 via INTRAVENOUS

## 2017-09-02 MED ORDER — METOCLOPRAMIDE HCL 5 MG PO TABS: 5.0000 mg | ORAL_TABLET | Freq: Three times a day (TID) | ORAL | Status: DC | PRN

## 2017-09-02 MED ORDER — LIDOCAINE 2% (20 MG/ML) 5 ML SYRINGE
INTRAMUSCULAR | Status: DC | PRN
  Administered 2017-09-02: 60 mg via INTRAVENOUS

## 2017-09-02 MED ORDER — ONDANSETRON HCL 4 MG/2ML IJ SOLN
INTRAMUSCULAR | Status: DC | PRN
  Administered 2017-09-02: 4 mg via INTRAVENOUS

## 2017-09-02 MED ORDER — ALBUMIN HUMAN 5 % IV SOLN
12.5000 g | Freq: Once | INTRAVENOUS | Status: AC
  Administered 2017-09-02: 12.5 g via INTRAVENOUS

## 2017-09-02 MED ORDER — TRAZODONE HCL 50 MG PO TABS
50.0000 mg | ORAL_TABLET | Freq: Every evening | ORAL | Status: DC | PRN
  Administered 2017-09-02: 50 mg via ORAL
  Filled 2017-09-02: qty 1

## 2017-09-02 MED ORDER — TRAMADOL HCL 50 MG PO TABS: 25.0000 mg | ORAL_TABLET | Freq: Two times a day (BID) | ORAL | Status: DC

## 2017-09-02 MED ORDER — FENTANYL CITRATE (PF) 100 MCG/2ML IJ SOLN
INTRAMUSCULAR | Status: DC | PRN
  Administered 2017-09-02 (×2): 50 ug via INTRAVENOUS

## 2017-09-02 MED ORDER — BACLOFEN 10 MG PO TABS
5.0000 mg | ORAL_TABLET | ORAL | Status: DC | PRN
Start: 2017-09-02 — End: 2017-09-08

## 2017-09-02 SURGICAL SUPPLY — 52 items
BAG ZIPLOCK 12X15 (MISCELLANEOUS) ×3 IMPLANT
BLADE SAW SGTL 11.0X1.19X90.0M (BLADE) IMPLANT
BLADE SAW SGTL 18X1.27X75 (BLADE) ×2 IMPLANT
BLADE SAW SGTL 18X1.27X75MM (BLADE) ×1
CAPT HIP HEMI 2 ×3 IMPLANT
CLOSURE WOUND 1/2 X4 (GAUZE/BANDAGES/DRESSINGS) ×2
COVER SURGICAL LIGHT HANDLE (MISCELLANEOUS) ×3 IMPLANT
DERMABOND ADVANCED (GAUZE/BANDAGES/DRESSINGS) ×2
DERMABOND ADVANCED .7 DNX12 (GAUZE/BANDAGES/DRESSINGS) ×1 IMPLANT
DRAPE ORTHO SPLIT 77X108 STRL (DRAPES) ×4
DRAPE POUCH INSTRU U-SHP 10X18 (DRAPES) ×3 IMPLANT
DRAPE SURG 17X11 SM STRL (DRAPES) ×3 IMPLANT
DRAPE SURG ORHT 6 SPLT 77X108 (DRAPES) ×2 IMPLANT
DRAPE U-SHAPE 47X51 STRL (DRAPES) ×3 IMPLANT
DRSG AQUACEL AG ADV 3.5X10 (GAUZE/BANDAGES/DRESSINGS) ×3 IMPLANT
DRSG TEGADERM 4X4.75 (GAUZE/BANDAGES/DRESSINGS) ×3 IMPLANT
DURAPREP 26ML APPLICATOR (WOUND CARE) ×3 IMPLANT
ELECT BLADE TIP CTD 4 INCH (ELECTRODE) ×3 IMPLANT
ELECT REM PT RETURN 15FT ADLT (MISCELLANEOUS) ×3 IMPLANT
EVACUATOR 1/8 PVC DRAIN (DRAIN) ×3 IMPLANT
FACESHIELD WRAPAROUND (MASK) ×12 IMPLANT
GAUZE SPONGE 2X2 8PLY STRL LF (GAUZE/BANDAGES/DRESSINGS) ×1 IMPLANT
GLOVE BIOGEL M 7.0 STRL (GLOVE) IMPLANT
GLOVE BIOGEL PI IND STRL 7.5 (GLOVE) ×1 IMPLANT
GLOVE BIOGEL PI IND STRL 8.5 (GLOVE) ×1 IMPLANT
GLOVE BIOGEL PI IND STRL 9 (GLOVE) ×1 IMPLANT
GLOVE BIOGEL PI INDICATOR 7.5 (GLOVE) ×2
GLOVE BIOGEL PI INDICATOR 8.5 (GLOVE) ×2
GLOVE BIOGEL PI INDICATOR 9 (GLOVE) ×2
GLOVE ECLIPSE 8.5 STRL (GLOVE) IMPLANT
GLOVE ORTHO TXT STRL SZ7.5 (GLOVE) ×6 IMPLANT
GOWN STRL REUS W/TWL 2XL LVL3 (GOWN DISPOSABLE) ×3 IMPLANT
GOWN STRL REUS W/TWL LRG LVL3 (GOWN DISPOSABLE) ×3 IMPLANT
GOWN STRL REUS W/TWL XL LVL3 (GOWN DISPOSABLE) ×6 IMPLANT
HANDPIECE INTERPULSE COAX TIP (DISPOSABLE)
IMMOBILIZER KNEE 20 (SOFTGOODS)
IMMOBILIZER KNEE 20 THIGH 36 (SOFTGOODS) IMPLANT
KIT BASIN OR (CUSTOM PROCEDURE TRAY) ×3 IMPLANT
MANIFOLD NEPTUNE II (INSTRUMENTS) ×3 IMPLANT
PACK TOTAL JOINT (CUSTOM PROCEDURE TRAY) ×3 IMPLANT
POSITIONER SURGICAL ARM (MISCELLANEOUS) ×3 IMPLANT
SET HNDPC FAN SPRY TIP SCT (DISPOSABLE) IMPLANT
SPONGE GAUZE 2X2 STER 10/PKG (GAUZE/BANDAGES/DRESSINGS) ×2
STRIP CLOSURE SKIN 1/2X4 (GAUZE/BANDAGES/DRESSINGS) ×4 IMPLANT
SUT ETHIBOND NAB CT1 #1 30IN (SUTURE) ×3 IMPLANT
SUT MNCRL AB 4-0 PS2 18 (SUTURE) ×3 IMPLANT
SUT VIC AB 1 CT1 36 (SUTURE) ×6 IMPLANT
SUT VIC AB 2-0 CT1 27 (SUTURE) ×4
SUT VIC AB 2-0 CT1 TAPERPNT 27 (SUTURE) ×2 IMPLANT
SUT VLOC 180 0 24IN GS25 (SUTURE) ×3 IMPLANT
TOWEL OR 17X26 10 PK STRL BLUE (TOWEL DISPOSABLE) ×6 IMPLANT
TRAY FOLEY W/METER SILVER 16FR (SET/KITS/TRAYS/PACK) ×3 IMPLANT

## 2017-09-02 NOTE — ED Notes (Signed)
Spoke with surgeon about patient staying at [facility] for surgery and admit.

## 2017-09-02 NOTE — ED Triage Notes (Signed)
Pt presents [facility] for [facility] left femoral neck fracture. EMS reports that pt has been moaning for the last several days and uncertain of when injury occurred. Facility did a xray of left hip and the fracture was found at time.

## 2017-09-02 NOTE — Transfer of Care (Signed)
Immediate Anesthesia Transfer of Care Note  Patient: Sherri Leonard  Procedure(s) Performed: ARTHROPLASTY BIPOLAR HIP (HEMIARTHROPLASTY) (Left Hip)  Patient Location: PACU  Anesthesia Type:General  Level of Consciousness: awake  Airway & Oxygen Therapy: Patient Spontanous Breathing and Patient connected to face mask oxygen  Post-op Assessment: Report given to RN and Post -op Vital signs reviewed and stable  Post vital signs: Reviewed and stable  Last Vitals:  Vitals Value Taken Time  BP    Temp    Pulse 66 09/02/2017  3:44 PM  Resp 24 09/02/2017  3:46 PM  SpO2 70 % 09/02/2017  3:44 PM  Vitals shown include unvalidated device data.  Last Pain:  Vitals:   09/02/17 0114  TempSrc: Oral         Complications: No apparent anesthesia complications

## 2017-09-02 NOTE — ED Notes (Addendum)
Bed placement called and was advised that pt was on waiting list for room assignment at this time.

## 2017-09-02 NOTE — H&P (View-Only) (Signed)
Reason for Consult:  Left hip fracture  Referring Physician: ED Physician  Sherri Leonard is an 85 y.o. female.  HPI: Patient with history of dementia presents from nursing facility.  Per staff at nursing home, patient was noted to be in pain and a hip x-ray was done that noted a left femoral neck fracture.  No recent falls reported.  Patient is unable to verbalize when this happened.  Patient is nonambulatory on a daily basis.  She is only able to stand with assistance and then sit down.  Ortho consulted.    Past Medical History:  Diagnosis Date  . Dementia   . OA (osteoarthritis)   . Pacemaker     Past Surgical History:  Procedure Laterality Date  . ATRIAL CARDIAC PACEMAKER INSERTION  2012  . BRAIN SURGERY  2013  . CARPAL TUNNEL RELEASE Bilateral   . FLEXIBLE SIGMOIDOSCOPY N/A 09/12/2014   Procedure: FLEXIBLE SIGMOIDOSCOPY;  Surgeon: Jerene Bears, MD;  Location: [facility];  Service: Gastroenterology;  Laterality: N/A;  . KNEE ARTHROPLASTY Bilateral     Family History  Problem Relation Age of Onset  . Heart disease Son   . Cancer Son 60       on Liver  . Alzheimer's disease Mother   . Alzheimer's disease Father   . Heart attack Son   . Colon polyps Neg Hx   . Diabetes Neg Hx   . Kidney disease Neg Hx     Social History:  reports that she has never smoked. She has never used smokeless tobacco. She reports that she does not drink alcohol or use drugs.  Allergies:  Allergies  Allergen Reactions  . Ativan [Lorazepam] Other (See Comments)    Causes violence   . Benzodiazepines      Results for orders placed or performed during the hospital encounter of 09/02/17 (from the past 48 hour(s))  Basic metabolic panel     Status: Abnormal   Collection Time: 09/02/17  2:04 AM  Result Value Ref Range   Sodium 144 135 - 145 mmol/L   Potassium 4.2 3.5 - 5.1 mmol/L   Chloride 110 101 - 111 mmol/L   CO2 24 22 - 32 mmol/L   Glucose, Bld 169 (H) 65 - 99 mg/dL   BUN 37 (H) 6  - 20 mg/dL   Creatinine, Ser 1.20 (H) 0.44 - 1.00 mg/dL   Calcium 8.8 (L) 8.9 - 10.3 mg/dL   GFR calc non Af Amer 37 (L) >60 mL/min   GFR calc Af Amer 43 (L) >60 mL/min    Comment: (NOTE) The eGFR has been calculated using the CKD EPI equation. This calculation has not been validated in all clinical situations. eGFR's persistently <60 mL/min signify possible Chronic Kidney Disease.    Anion gap 10 5 - 15    Comment: Performed at [facility], [place] [address], [place], [place] [address]  CBC WITH DIFFERENTIAL     Status: Abnormal   Collection Time: 09/02/17  2:04 AM  Result Value Ref Range   WBC 14.7 (H) 4.0 - 10.5 K/uL   RBC 3.06 (L) 3.87 - 5.11 MIL/uL   Hemoglobin 9.2 (L) 12.0 - 15.0 g/dL   HCT 29.0 (L) 36.0 - 46.0 %   MCV 94.8 78.0 - 100.0 fL   MCH 30.1 26.0 - 34.0 pg   MCHC 31.7 30.0 - 36.0 g/dL   RDW 14.8 11.5 - 15.5 %   Platelets 341 150 - 400 K/uL   Neutrophils  Relative % 82 %   Neutro Abs 12.0 (H) 1.7 - 7.7 K/uL   Lymphocytes Relative 12 %   Lymphs Abs 1.7 0.7 - 4.0 K/uL   Monocytes Relative 6 %   Monocytes Absolute 0.9 0.1 - 1.0 K/uL   Eosinophils Relative 0 %   Eosinophils Absolute 0.1 0.0 - 0.7 K/uL   Basophils Relative 0 %   Basophils Absolute 0.0 0.0 - 0.1 K/uL    Comment: Performed at [facility], [place] [address] [place], [place] [address]  Protime-INR     Status: None   Collection Time: 09/02/17  2:04 AM  Result Value Ref Range   Prothrombin Time 14.5 11.4 - 15.2 seconds   INR 1.14     Comment: Performed at [facility], [place] [address] [place], [place] [address]  Type and screen [place]     Status: None   Collection Time: 09/02/17  2:07 AM  Result Value Ref Range   ABO/RH(D) O POS    Antibody Screen NEG    Sample Expiration      09/05/2017 Performed at [facility], [place] [address] [place], [place] [address]   ABO/Rh     Status: None   Collection Time:  09/02/17  2:07 AM  Result Value Ref Range   ABO/RH(D)      O POS Performed at [facility], [place] [address] [place], [place] [address]   Troponin I     Status: Abnormal   Collection Time: 09/02/17  5:10 AM  Result Value Ref Range   Troponin I 0.04 (HH) <0.03 ng/mL    Comment: CRITICAL RESULT CALLED TO, READ BACK BY AND VERIFIED WITH: K MINGRIA,RN @0600  09/02/17 MKELLY Performed at [facility], [place] [address], [place], [place] [address]     Dg Chest 1 View  Result Date: 09/02/2017 CLINICAL DATA:  Hip fracture EXAM: CHEST  1 VIEW COMPARISON:  04/06/2015 FINDINGS: Elevation of the right diaphragm. Left-sided pacing device as before. Atelectasis at the right base. No acute consolidation or effusion. Stable cardiomediastinal silhouette. No pneumothorax. Postsurgical changes of the left humerus. IMPRESSION: No active disease.  Stable elevation of the right diaphragm. Electronically Signed   By: Donavan Foil M.D.   On: 09/02/2017 03:00   Dg Hip Unilat With Pelvis 2-3 Views Left  Result Date: 09/02/2017 CLINICAL DATA:  Left hip pain EXAM: DG HIP (WITH OR WITHOUT PELVIS) 2-3V LEFT COMPARISON:  None. FINDINGS: Right femoral head projects in joint. Pubic symphysis and rami appear intact. Acute left femoral neck fracture with varus angulation and cephalad migration of the distal femur. About 1 bone with of anterior displacement of distal fracture fragment. IMPRESSION: Acute displaced and mildly angulated left femoral neck fracture Electronically Signed   By: Donavan Foil M.D.   On: 09/02/2017 02:54    Review of Systems  Constitutional: Negative.   HENT: Negative.   Eyes: Negative.   Respiratory: Negative.   Cardiovascular: Negative.   Gastrointestinal: Negative.   Genitourinary: Negative.   Musculoskeletal: Positive for joint pain.  Skin: Negative.   Neurological: Negative.   Endo/Heme/Allergies: Negative.   Psychiatric/Behavioral: Positive for memory  loss.    Blood pressure 119/62, pulse 92, temperature 98.2 F (36.8 C), temperature source Oral, resp. rate 17, height 5' 5"  (1.651 m), weight 65.8 kg (145 lb), SpO2 97 %. Physical Exam  Constitutional: She is oriented to person, place, and time. She appears well-developed.  HENT:  Head: Normocephalic.  Eyes: Pupils are equal, round, and reactive to light.  Neck: Neck supple. No JVD present. No tracheal deviation present. No thyromegaly present.  Cardiovascular: Normal rate, regular rhythm and intact distal pulses.  Respiratory: Effort normal and breath sounds normal. No respiratory distress. She has no wheezes.  GI: Soft. There is no tenderness. There is no guarding.  Musculoskeletal:       Left hip: She exhibits decreased range of motion, decreased strength, tenderness and bony tenderness. She exhibits no swelling, no deformity and no laceration.  Lymphadenopathy:    She has no cervical adenopathy.  Neurological: She is alert and oriented to person, place, and time.  Skin: Skin is warm and dry.  Psychiatric: She has a normal mood and affect. Cognition and memory are impaired.    Assessment/Plan: Left femoral neck fracture   Discussion was had with the family and the granddaughter wants to proceed with surgery to help pain control.  Risks, benefits and expectations were discussed with the patient's family member.  Risks including but not limited to the risk of anesthesia, blood clots, nerve damage, blood vessel damage, failure of the prosthesis, infection and up to and including death.  Patient's family member understands the risks, benefits and expectations and wishes to proceed with surgery.   NPO now  Plan for left hip hemiarthroplasty    Lucille Passy [facility] 09/02/2017, 10:11 AM

## 2017-09-02 NOTE — Interval H&P Note (Signed)
History and Physical Interval Note:  09/02/2017 1:21 PM  Sherri Leonard  has presented today for surgery, with the diagnosis of left femoral neck fracture  The various methods of treatment have been discussed with the patient and family. After consideration of risks, benefits and other options for treatment, the patient has consented to  Procedure(s): ARTHROPLASTY BIPOLAR HIP (HEMIARTHROPLASTY) (Left) as a surgical intervention .  The patient's history has been reviewed, patient examined, no change in status, stable for surgery.  I have reviewed the patient's chart and labs.  Questions were answered to the patient's satisfaction.     Shelda PalMatthew D Payge Eppes

## 2017-09-02 NOTE — Anesthesia Preprocedure Evaluation (Signed)
Anesthesia Evaluation  Patient identified by MRN, date of birth, ID band Patient awake    Reviewed: Allergy & Precautions, H&P , NPO status , Patient's Chart, lab work & pertinent test results  Airway Mallampati: II   Neck ROM: full    Dental   Pulmonary    breath sounds clear to auscultation       Cardiovascular hypertension, + pacemaker  Rhythm:regular Rate:Normal     Neuro/Psych PSYCHIATRIC DISORDERS Dementia    GI/Hepatic   Endo/Other    Renal/GU      Musculoskeletal  (+) Arthritis ,   Abdominal   Peds  Hematology  (+) anemia ,   Anesthesia Other Findings   Reproductive/Obstetrics                             Anesthesia Physical Anesthesia Plan  ASA: III  Anesthesia Plan: General   Post-op Pain Management:    Induction: Intravenous  PONV Risk Score and Plan: 3 and Ondansetron, Dexamethasone and Treatment may vary due to age or medical condition  Airway Management Planned: Oral ETT  Additional Equipment:   Intra-op Plan:   Post-operative Plan: Extubation in OR  Informed Consent: I have reviewed the patients History and Physical, chart, labs and discussed the procedure including the risks, benefits and alternatives for the proposed anesthesia with the patient or authorized representative who has indicated his/her understanding and acceptance.     Plan Discussed with: CRNA, Anesthesiologist and Surgeon  Anesthesia Plan Comments:         Anesthesia Quick Evaluation

## 2017-09-02 NOTE — ED Provider Notes (Signed)
[facility]-EMERGENCY DEPT Provider Note   CSN: [id number] Arrival date & time: 09/02/17  0101     History   Chief Complaint Chief Complaint  Patient presents with  . Hip Injury   Level 5 caveat due to dementia HPI Sherri Leonard is a 84 y.o. female.  The history is provided by the patient and the nursing home. The history is limited by the condition of the patient.  Hip Pain  This is a new problem. The problem occurs constantly. The problem has not changed since onset.Exacerbated by: movement. Nothing relieves the symptoms.  Patient with history of dementia presents from nursing facility.  Per staff at nursing home, patient was noted to be in pain and a hip x-ray was done that noted a left femoral neck fracture.  No recent falls reported.  Patient is unable to verbalize when this happened.  Patient is nonambulatory on a daily basis.  She is only able to stand with assistance and then sit down.  Past Medical History:  Diagnosis Date  . Dementia   . OA (osteoarthritis)   . Pacemaker     Patient Active Problem List   Diagnosis Date Noted  . Osteoarthritis 08/28/2016  . Presence of permanent cardiac pacemaker 05/27/2016  . Essential hypertension, benign 09/07/2015  . Arthritis 05/03/2015  . Alzheimer's disease 04/10/2015  . Parkinsonian features 04/10/2015  . Depression due to dementia 04/10/2015  . Anemia 04/06/2015  . Hyperglycemia 04/06/2015  . Sick sinus syndrome (HCC) 05/06/2013    Past Surgical History:  Procedure Laterality Date  . ATRIAL CARDIAC PACEMAKER INSERTION  2012  . BRAIN SURGERY  2013  . CARPAL TUNNEL RELEASE Bilateral   . FLEXIBLE SIGMOIDOSCOPY N/A 09/12/2014   Procedure: FLEXIBLE SIGMOIDOSCOPY;  Surgeon: Beverley Fiedler, MD;  Location: [facility];  Service: Gastroenterology;  Laterality: N/A;  . KNEE ARTHROPLASTY Bilateral      OB History   None      Home Medications    Prior to Admission medications   Medication Sig  Start Date End Date Taking? Authorizing Provider  acetaminophen (TYLENOL) 325 MG tablet Take 2 tablets (650 mg total) by mouth every 6 (six) hours as needed. 08/28/16   Pecola Lawless, MD  cholecalciferol (VITAMIN D) 1000 UNITS tablet Take 1 tablet (1,000 Units total) by mouth daily. 03/30/15   Sharon Seller, NP  Melatonin 3 MG TABS Take 3 mg by mouth.    [provider]  meloxicam (MOBIC) 7.5 MG tablet Take 1 tablet (7.5 mg total) by mouth daily. 08/28/16   Pecola Lawless, MD  naproxen sodium (ANAPROX) 220 MG tablet Take 220 mg by mouth 2 (two) times daily with a meal.    [provider]  sertraline (ZOLOFT) 100 MG tablet Take 100 mg by mouth daily.    [provider]  UNABLE TO FIND Med Name: House 2.0 three times daily 90 mL    [provider]    Family History Family History  Problem Relation Age of Onset  . Heart disease Son   . Cancer Son 60       on Liver  . Alzheimer's disease Mother   . Alzheimer's disease Father   . Heart attack Son   . Colon polyps Neg Hx   . Diabetes Neg Hx   . Kidney disease Neg Hx     Social History Social History   Tobacco Use  . Smoking status: Never Smoker  . Smokeless tobacco: Never Used  Substance Use Topics  . Alcohol use: No    Alcohol/week: 0.0 oz  . Drug use: No     Allergies   Ativan [lorazepam] and Benzodiazepines   Review of Systems Review of Systems  Unable to perform ROS: Dementia     Physical Exam Updated Vital Signs BP 114/73 (BP Location: Left Arm)   Pulse (!) 103   Temp 98.2 F (36.8 C) (Oral)   Resp 16   Ht 1.651 m (5\' 5" )   Wt 65.8 kg (145 lb)   SpO2 97%   BMI 24.13 kg/m   Physical Exam  CONSTITUTIONAL: Elderly and frail HEAD: Normocephalic/atraumatic EYES: EOMI ENMT: Mucous membranes moist NECK: supple no meningeal signs SPINE/BACK:entire spine nontender, no bruising/crepitance/stepoffs noted to spine CV: S1/S2 noted LUNGS: Lungs are clear to  auscultation bilaterally, no apparent distress ABDOMEN: soft NEURO: Pt is awake/alert moaning in pain.  Appears to move all extremities x4 She appears confused.   EXTREMITIES: pulses normal/equal, left lower extremity is shortened and rotated.  Tenderness with range of motion left hip All other extremities/joints palpated/ranged and nontender  SKIN: warm, color normal PSYCH: Unable to assess  ED Treatments / Results  Labs (all labs ordered are listed, but only abnormal results are displayed) Labs Reviewed  BASIC METABOLIC PANEL - Abnormal; Notable for the following components:      Result Value   Glucose, Bld 169 (*)    BUN 37 (*)    Creatinine, Ser 1.20 (*)    Calcium 8.8 (*)    GFR calc non Af Amer 37 (*)    GFR calc Af Amer 43 (*)    All other components within normal limits  CBC WITH DIFFERENTIAL/PLATELET - Abnormal; Notable for the following components:   WBC 14.7 (*)    RBC 3.06 (*)    Hemoglobin 9.2 (*)    HCT 29.0 (*)    Neutro Abs 12.0 (*)    All other components within normal limits  PROTIME-INR  TYPE AND SCREEN  ABO/RH    EKG EKG Interpretation  Date/Time:  Tuesday September 02 2017 01:48:49 EDT Ventricular Rate:  98 PR Interval:    QRS Duration: 108 QT Interval:  358 QTC Calculation: 458 R Axis:   -146 Text Interpretation:  Sinus rhythm Short PR interval Probable right ventricular hypertrophy Borderline T abnormalities, diffuse leads No significant change since last tracing Confirmed by Zadie RhineWickline, Karianne Nogueira ([id number]) on 09/02/2017 2:11:55 AM   Radiology Dg Chest 1 View  Result Date: 09/02/2017 CLINICAL DATA:  Hip fracture EXAM: CHEST  1 VIEW COMPARISON:  04/06/2015 FINDINGS: Elevation of the right diaphragm. Left-sided pacing device as before. Atelectasis at the right base. No acute consolidation or effusion. Stable cardiomediastinal silhouette. No pneumothorax. Postsurgical changes of the left humerus. IMPRESSION: No active disease.  Stable elevation of the right  diaphragm. Electronically Signed   By: Jasmine PangKim  Fujinaga M.D.   On: 09/02/2017 03:00   Dg Hip Unilat With Pelvis 2-3 Views Left  Result Date: 09/02/2017 CLINICAL DATA:  Left hip pain EXAM: DG HIP (WITH OR WITHOUT PELVIS) 2-3V LEFT COMPARISON:  None. FINDINGS: Right femoral head projects in joint. Pubic symphysis and rami appear intact. Acute left femoral neck fracture with varus angulation and cephalad migration of the distal femur. About 1 bone with of anterior displacement of distal fracture fragment. IMPRESSION: Acute displaced and mildly angulated left femoral neck fracture Electronically Signed   By: Jasmine PangKim  Fujinaga M.D.   On: 09/02/2017 02:54    Procedures Procedures (including  critical care time)  Medications Ordered in ED Medications  fentaNYL (SUBLIMAZE) injection 25 mcg (has no administration in time range)     Initial Impression / Assessment and Plan / ED Course  I have reviewed the triage vital signs and the nursing notes.  Pertinent labs & imaging results that were available during my care of the patient were reviewed by me and considered in my medical decision making (see chart for details).     2:10 AM I discussed the case with the [facility].  That is where patient lives.  No falls reported, unclear when this occurred.  Family is already been contacted. [phone] (granddaughter Armena) 3:28 AM Patient found to have left femoral neck fracture.  Unclear when this occurred.  I discussed with her granddaughter at the above phone number.  She will be coming to see the patient soon.  She is to be MPOA.  Will consult orthopedics and hospitalist 3:37 AM Discussed with Dr. Renaye Rakers with orthopedics.  He requested transfer to [facility] and admit to hospitalist service.  I have called her granddaughter and informed her of the change in venue 3:38 AM Discussed with Dr. Selena Batten for admission Final Clinical Impressions(s) / ED Diagnoses   Final diagnoses:  Left displaced femoral  neck fracture [facility])    ED Discharge Orders    None       Zadie Rhine, MD 09/02/17 [phone]

## 2017-09-02 NOTE — ED Notes (Signed)
Ring given to OR transporter whom stated that she would give it to the daughter.

## 2017-09-02 NOTE — Significant Event (Signed)
Rapid Response Event Note  Overview: Time Called: 2230 Arrival Time: 2235 Event Type: Other (Comment)  Initial Focused Assessment: pt moaning in pain, RN with low blood pressure reading.  Bolus administered per RR protocol, pt had needed albumin and bolus in OR.  Lung sounds were diminished but clear prior to and after bolus of saline.  Ice applied and tylenol administered to help with pain.   Interventions:  Plan of Care (if not transferred): stayed in room  Event Summary:   at      at          [facility]

## 2017-09-02 NOTE — ED Notes (Signed)
Bed: WA17 Expected date:  Expected time:  Means of arrival:  Comments: 84 yr old femoral neck fracture

## 2017-09-02 NOTE — Anesthesia Procedure Notes (Signed)
Procedure Name: Intubation Date/Time: 09/02/2017 1:35 PM Performed by: Florene Routeeardon, Marisol Glazer L, CRNA Patient Re-evaluated:Patient Re-evaluated prior to induction Oxygen Delivery Method: Circle system utilized Preoxygenation: Pre-oxygenation with 100% oxygen Induction Type: IV induction Ventilation: Mask ventilation without difficulty Laryngoscope Size: Miller and 3 Grade View: Grade II Tube type: Oral Tube size: 7.0 mm Number of attempts: 1 Airway Equipment and Method: Stylet Placement Confirmation: ETT inserted through vocal cords under direct vision,  positive ETCO2 and breath sounds checked- equal and bilateral Secured at: 21 cm Tube secured with: Tape Dental Injury: Teeth and Oropharynx as per pre-operative assessment

## 2017-09-02 NOTE — Op Note (Signed)
NAME:  Sherri Leonard, Sherri Leonard                ACCOUNT NO.:  [id number]   MEDICAL RECORD NO.: [id number]   LOCATION:  1435                         FACILITY:  [facility]   DATE OF BIRTH:  03-21-2021  PHYSICIAN:  Sherri Frankel. Charlann Boxer, M.D.     DATE OF PROCEDURE:  09/02/2017                               OPERATIVE REPORT     PREOPERATIVE DIAGNOSIS:  Left displaced femoral neck fracture.   POSTOPERATIVE DIAGNOSIS:  Left displaced femoral neck fracture.   PROCEDURE:  left hip hemiarthroplasty utilizing DePuy component, size 4 high offset Tri-Lock stem with a 42mm unipolar ball with a +0 adapter.   SURGEON:  Sherri Frankel. Charlann Boxer, MD   ASSISTANT:  Sherri Mayer, Sherri Leonard.   ANESTHESIA:  General.   SPECIMENS:  None.   DRAINS:  none.   BLOOD LOSS:  About 400 cc.   COMPLICATIONS:  None.   INDICATION OF PROCEDURE:  Sherri Leonard is a 84 year old female with dementia who lives in skilled nursing facility.  She apparently had a fall when try to get out of her wheelchair unassisted.  She was admitted to the hospital after radiographs revealed a femoral neck fracture.  She was seen and evaluated and was scheduled for surgery for fixation.  The necessity of surgical repair was discussed with her Granddaughter (POA).  Consent was obtained after reviewing risks of infection, DVT, component failure, and need for revision surgery.   PROCEDURE IN DETAIL:  The patient was brought to the operative theater. Once adequate anesthesia, preoperative antibiotics, 2 g of Ancef administered, the patient was positioned into the right lateral decubitus position with the left side up.  The left lower extremity was then prepped and draped in sterile fashion.  A time-out was performed identifying the patient, planned procedure, and extremity.   A lateral incision was made off the proximal trochanter. Sharp dissection was carried down to the iliotibial band and gluteal fascia. The gluteal fascia was then incised for posterior  approach.  The short external rotators were taken down separate from the posterior capsule. An L capsulotomy was made preserving the posterior leaflet for later anatomic repair. Fracture site was identified and after removing comminuted segments of the posterior femoral neck, the femoral head was removed without difficulty and measured on the back table  using the sizing rings and determined to be 42 mm in diameter.   The proximal femur was then exposed.  Retractors placed.  I then drilled, opened the proximal femur.  Then I hand reamed once and  Irrigated the canal to try to prevent fat emboli.  I began broaching the femur with a starting broach up to a size 4 broach with good medial and lateral metaphyseal fit without evidence of any torsion or movement.  A trial reduction was carried out with a high offset neck and a +0 adapter with a 42mm ball.  The hip reduced nicely.  The leg lengths appeared to be equal compared to the down leg.   The hip went through a range of motion without evidence of any subluxation or impingement.   Given these findings, the trial components removed.  The final 4 Hi  Tri-Lock stem was opened.  After irrigating the canal, the final stem was impacted and sat at the level where the broach was. Based on this and the trial reduction, a +0 adapter was opened and impacted in the 42mm unipolar ball onto a clean and dry trunnion.  The hip had been irrigated throughout the case and again at this point.  I re- Approximated the posterior capsule to the superior leaflet using a  #1 Vicryl.  The remainder of the wound was closed with #1 Vicryl in the iliotibial band and gluteal fascia, a  2-0 Vicryl in the sub-Q tissue and a running 4-0 Monocryl in the skin.  The hip was cleaned, dried, and dressed sterilely using Dermabond and Aquacel dressing.  She was then brought to recovery room, extubated in stable condition, tolerating the procedure well.  Sherri MayerBlair Roberts, Sherri Leonard  was present and utilized as Geophysicist/field seismologistassistant for the entire case from preoperative positioning to management of the contralateral extremity and retractors to general facilitation of the procedure.  She was also involved with primary wound closure.         Sherri Leonard, M.D.

## 2017-09-02 NOTE — Progress Notes (Signed)
Patient seen and evaluated, chart reviewed, please see EMR for updated orders. Please see full H&P dictated by admitting physician Dr Selena BattenKim for same date of service.     Lt Hip Fx-  ORIF on 09/02/17 by Dr Raylene EvertsMathew Olin, further management per orthopedic team  AKI----acute kidney injury  -     creatinine on admission= 1.2  ,   baseline creatinine = 0.7   , creatinine is now=  pending    ,  Avoid nephrotoxic agents/dehydration/hypotension  FEN-change IV fluids to dextrose solution while the patient remains n.p.o., cbg q 6 hrs    Patient seen and evaluated, chart reviewed, please see EMR for updated orders. Please see full H&P dictated by admitting physician Dr Selena BattenKim for same date of service   Sherri Haleourage Erial Fikes, MD

## 2017-09-02 NOTE — H&P (Signed)
TRH H&P   Patient Demographics:    Sherri Leonard, is a 84 y.o. female  MRN: [id number]   DOB - 07-14-1920  Admit Date - 09/02/2017  Outpatient Primary MD for the patient is Sherron Monday, MD  Referring MD/NP/PA:   Zadie Rhine  Outpatient Specialists:      Patient coming from: home  Chief Complaint  Patient presents with  . Hip Injury      HPI:    Sherri Leonard  is a 84 y.o. female, w dementia apparently presents from [facility] for left femoral neck fracture. Pt had been moaning for the past several days and uncertain when injury occurred .  Facility did xray and fracture was seen and patient was apparently sent to ED.   In Ed, orthopedics contacted and Dr. Eulah Pont requested that the patient be transferred to [facility].  ED spoke with grand daughter and informed her of change of hospital.   Xray Left hip IMPRESSION: Acute displaced and mildly angulated left femoral neck fracture  CXR IMPRESSION: No active disease.  Stable elevation of the right diaphragm.   Na 144, K 4.2,  Bun 37, Creatinine 1.20 Wbc 14.7, Hgb 9.2  Plt 341 INR 1.14   Pt will be admitted for w/up of left femoral neck fracture.       Review of systems:    In addition to the HPI above, unable to obtain clearly due to dementia No Fever-chills, No Headache, No changes with Vision or hearing, No problems swallowing food or Liquids, No Chest pain, Cough or Shortness of Breath, No Abdominal pain, No Nausea or Vommitting, Bowel movements are regular, No Blood in stool or Urine, No dysuria, No new skin rashes or bruises, No new joints pains-aches,   No recent weight gain or loss, No polyuria, polydypsia or polyphagia, No significant Mental Stressors.  A full 10 point Review of Systems was done, except as stated above, all other Review of Systems were  negative.   With Past History of the following :    Past Medical History:  Diagnosis Date  . Dementia   . OA (osteoarthritis)   . Pacemaker       Past Surgical History:  Procedure Laterality Date  . ATRIAL CARDIAC PACEMAKER INSERTION  2012  . BRAIN SURGERY  2013  . CARPAL TUNNEL RELEASE Bilateral   . FLEXIBLE SIGMOIDOSCOPY N/A 09/12/2014   Procedure: FLEXIBLE SIGMOIDOSCOPY;  Surgeon: Beverley Fiedler, MD;  Location: [facility];  Service: Gastroenterology;  Laterality: N/A;  . KNEE ARTHROPLASTY Bilateral       Social History:     Social History   Tobacco Use  . Smoking status: Never Smoker  . Smokeless tobacco: Never Used  Substance Use Topics  . Alcohol use: No    Alcohol/week: 0.0 oz     Lives - at [facility] - unclear,  Family History :     Family History  Problem Relation Age of Onset  . Heart disease Son   . Cancer Son 60       on Liver  . Alzheimer's disease Mother   . Alzheimer's disease Father   . Heart attack Son   . Colon polyps Neg Hx   . Diabetes Neg Hx   . Kidney disease Neg Hx       Home Medications:   Prior to Admission medications   Medication Sig Start Date End Date Taking? Authorizing Provider  acetaminophen (TYLENOL) 325 MG tablet Take 650 mg by mouth every 6 (six) hours.  08/28/16  Yes Pecola Lawless, MD  baclofen (LIORESAL) 10 MG tablet Take 5 mg by mouth as needed for muscle spasms.   Yes [provider]  cholecalciferol (VITAMIN D) 1000 UNITS tablet Take 1 tablet (1,000 Units total) by mouth daily. 03/30/15  Yes Sharon Seller, NP  ENSURE (ENSURE) Take 237 mLs by mouth 3 (three) times daily between meals.   Yes [provider]  Melatonin 3 MG TABS Take 3 mg by mouth at bedtime.    Yes [provider]  Menthol, Topical Analgesic, (BIOFREEZE) 4 % GEL Apply 1 application topically every 8 (eight) hours as needed (muscle pain).   Yes [provider]  sertraline (ZOLOFT) 100 MG  tablet Take 125 mg by mouth daily.    Yes [provider]  traMADol (ULTRAM) 50 MG tablet Take 25 mg by mouth 2 (two) times daily.   Yes [provider]     Allergies:     Allergies  Allergen Reactions  . Ativan [Lorazepam] Other (See Comments)    Causes violence   . Benzodiazepines      Physical Exam:   Vitals  Blood pressure 120/63, pulse 96, temperature 98.2 F (36.8 C), temperature source Oral, resp. rate 19, height 5\' 5"  (1.651 m), weight 65.8 kg (145 lb), SpO2 98 %.   1. General  lying in bed in NAD,    2. Normal affect and insight, Not Suicidal or Homicidal, Awake Alert, Oriented X 1`  3. No F.N deficits, ALL C.Nerves Intact, Strength 5/5 all 4 extremities, Sensation intact all 4 extremities, Plantars down going.  4. Ears and Eyes appear Normal, Conjunctivae clear, PERRLA. Moist Oral Mucosa.  5. Supple Neck, No JVD, No cervical lymphadenopathy appriciated, No Carotid Bruits.  6. Symmetrical Chest wall movement, Good air movement bilaterally, CTAB.  7. RRR, No Gallops, Rubs or Murmurs, No Parasternal Heave.  8. Positive Bowel Sounds, Abdomen Soft, No tenderness, No organomegaly appriciated,No rebound -guarding or rigidity.  9.  No Cyanosis, Normal Skin Turgor, No Skin Rash or Bruise.  10. Good muscle tone,  joints appear normal , no effusions, Normal ROM.  11. No Palpable Lymph Nodes in Neck or Axillae     Data Review:    CBC Recent Labs  Lab 09/02/17 0204  WBC 14.7*  HGB 9.2*  HCT 29.0*  PLT 341  MCV 94.8  MCH 30.1  MCHC 31.7  RDW 14.8  LYMPHSABS 1.7  MONOABS 0.9  EOSABS 0.1  BASOSABS 0.0   ------------------------------------------------------------------------------------------------------------------  Chemistries  Recent Labs  Lab 09/02/17 0204  NA 144  K 4.2  CL 110  CO2 24  GLUCOSE 169*  BUN 37*  CREATININE 1.20*  CALCIUM 8.8*    ------------------------------------------------------------------------------------------------------------------ estimated creatinine clearance is 25.8 mL/min (A) (by C-G formula based on SCr of 1.2 mg/dL (H)). ------------------------------------------------------------------------------------------------------------------ No results  for input(s): TSH, T4TOTAL, T3FREE, THYROIDAB in the last 72 hours.  Invalid input(s): FREET3  Coagulation profile Recent Labs  Lab 09/02/17 0204  INR 1.14   ------------------------------------------------------------------------------------------------------------------- No results for input(s): DDIMER in the last 72 hours. -------------------------------------------------------------------------------------------------------------------  Cardiac Enzymes No results for input(s): CKMB, TROPONINI, MYOGLOBIN in the last 168 hours.  Invalid input(s): CK ------------------------------------------------------------------------------------------------------------------ No results found for: BNP   ---------------------------------------------------------------------------------------------------------------  Urinalysis    Component Value Date/Time   COLORURINE AMBER (A) 04/06/2015 1800   APPEARANCEUR CLOUDY (A) 04/06/2015 1800   LABSPEC 1.025 04/06/2015 1800   PHURINE 5.5 04/06/2015 1800   GLUCOSEU NEGATIVE 04/06/2015 1800   HGBUR NEGATIVE 04/06/2015 1800   BILIRUBINUR SMALL (A) 04/06/2015 1800   KETONESUR NEGATIVE 04/06/2015 1800   PROTEINUR 30 (A) 04/06/2015 1800   UROBILINOGEN 0.2 08/24/2014 1410   NITRITE NEGATIVE 04/06/2015 1800   LEUKOCYTESUR NEGATIVE 04/06/2015 1800    ----------------------------------------------------------------------------------------------------------------   Imaging Results:    Dg Chest 1 View  Result Date: 09/02/2017 CLINICAL DATA:  Hip fracture EXAM: CHEST  1 VIEW COMPARISON:  04/06/2015 FINDINGS:  Elevation of the right diaphragm. Left-sided pacing device as before. Atelectasis at the right base. No acute consolidation or effusion. Stable cardiomediastinal silhouette. No pneumothorax. Postsurgical changes of the left humerus. IMPRESSION: No active disease.  Stable elevation of the right diaphragm. Electronically Signed   By: Jasmine PangKim  Fujinaga M.D.   On: 09/02/2017 03:00   Dg Hip Unilat With Pelvis 2-3 Views Left  Result Date: 09/02/2017 CLINICAL DATA:  Left hip pain EXAM: DG HIP (WITH OR WITHOUT PELVIS) 2-3V LEFT COMPARISON:  None. FINDINGS: Right femoral head projects in joint. Pubic symphysis and rami appear intact. Acute left femoral neck fracture with varus angulation and cephalad migration of the distal femur. About 1 bone with of anterior displacement of distal fracture fragment. IMPRESSION: Acute displaced and mildly angulated left femoral neck fracture Electronically Signed   By: Jasmine PangKim  Fujinaga M.D.   On: 09/02/2017 02:54    ekg nsr at 98, nl axis, early R progression   Assessment & Plan:    Principal Problem:   Closed left hip fracture (HCC)   L hip fracture NPO  Orthopedics consulted appreciate input Granddaughter is not sure about surgery per ED, she will make a final decision this morning, please reach out to her this am.   Anxiety Cont Zoloft      DVT Prophylaxis Lovenox - SCDs   AM Labs Ordered, also please review Full Orders  Family Communication: Admission, patients condition and plan of care including tests being ordered have been discussed with the patient who indicate understanding and agree with the plan and Code Status.  Code Status FULL CODE  Likely DC to  ALF  Condition GUARDED  Consults called: orthopedics by ED, Renaye Rakersim Murphy  Admission status: inpatient  Time spent in minutes : 45   Pearson GrippeJames Lexxus Underhill M.D on 09/02/2017 at 4:42 AM  Between 7am to 7pm - Pager - [phone]   After 7pm go to [email or web address] - password [email or web address]  [facility] - Office   [phone]

## 2017-09-02 NOTE — Consult Note (Signed)
Reason for Consult:  Left hip fracture  Referring Physician: ED Physician  Sherri Leonard is an 84 y.o. female.  HPI: Patient with history of dementia presents from nursing facility.  Per staff at nursing home, patient was noted to be in pain and a hip x-ray was done that noted a left femoral neck fracture.  No recent falls reported.  Patient is unable to verbalize when this happened.  Patient is nonambulatory on a daily basis.  She is only able to stand with assistance and then sit down.  Ortho consulted.    Past Medical History:  Diagnosis Date  . Dementia   . OA (osteoarthritis)   . Pacemaker     Past Surgical History:  Procedure Laterality Date  . ATRIAL CARDIAC PACEMAKER INSERTION  2012  . BRAIN SURGERY  2013  . CARPAL TUNNEL RELEASE Bilateral   . FLEXIBLE SIGMOIDOSCOPY N/A 09/12/2014   Procedure: FLEXIBLE SIGMOIDOSCOPY;  Surgeon: Jerene Bears, MD;  Location: [facility];  Service: Gastroenterology;  Laterality: N/A;  . KNEE ARTHROPLASTY Bilateral     Family History  Problem Relation Age of Onset  . Heart disease Son   . Cancer Son 60       on Liver  . Alzheimer's disease Mother   . Alzheimer's disease Father   . Heart attack Son   . Colon polyps Neg Hx   . Diabetes Neg Hx   . Kidney disease Neg Hx     Social History:  reports that she has never smoked. She has never used smokeless tobacco. She reports that she does not drink alcohol or use drugs.  Allergies:  Allergies  Allergen Reactions  . Ativan [Lorazepam] Other (See Comments)    Causes violence   . Benzodiazepines      Results for orders placed or performed during the hospital encounter of 09/02/17 (from the past 48 hour(s))  Basic metabolic panel     Status: Abnormal   Collection Time: 09/02/17  2:04 AM  Result Value Ref Range   Sodium 144 135 - 145 mmol/L   Potassium 4.2 3.5 - 5.1 mmol/L   Chloride 110 101 - 111 mmol/L   CO2 24 22 - 32 mmol/L   Glucose, Bld 169 (H) 65 - 99 mg/dL   BUN 37 (H) 6  - 20 mg/dL   Creatinine, Ser 1.20 (H) 0.44 - 1.00 mg/dL   Calcium 8.8 (L) 8.9 - 10.3 mg/dL   GFR calc non Af Amer 37 (L) >60 mL/min   GFR calc Af Amer 43 (L) >60 mL/min    Comment: (NOTE) The eGFR has been calculated using the CKD EPI equation. This calculation has not been validated in all clinical situations. eGFR's persistently <60 mL/min signify possible Chronic Kidney Disease.    Anion gap 10 5 - 15    Comment: Performed at [facility], [place] [address] [place], [place] [address]  CBC WITH DIFFERENTIAL     Status: Abnormal   Collection Time: 09/02/17  2:04 AM  Result Value Ref Range   WBC 14.7 (H) 4.0 - 10.5 K/uL   RBC 3.06 (L) 3.87 - 5.11 MIL/uL   Hemoglobin 9.2 (L) 12.0 - 15.0 g/dL   HCT 29.0 (L) 36.0 - 46.0 %   MCV 94.8 78.0 - 100.0 fL   MCH 30.1 26.0 - 34.0 pg   MCHC 31.7 30.0 - 36.0 g/dL   RDW 14.8 11.5 - 15.5 %   Platelets 341 150 - 400 K/uL   Neutrophils  Relative % 82 %   Neutro Abs 12.0 (H) 1.7 - 7.7 K/uL   Lymphocytes Relative 12 %   Lymphs Abs 1.7 0.7 - 4.0 K/uL   Monocytes Relative 6 %   Monocytes Absolute 0.9 0.1 - 1.0 K/uL   Eosinophils Relative 0 %   Eosinophils Absolute 0.1 0.0 - 0.7 K/uL   Basophils Relative 0 %   Basophils Absolute 0.0 0.0 - 0.1 K/uL    Comment: Performed at [facility], [place] [address] [place], [place] [address]  Protime-INR     Status: None   Collection Time: 09/02/17  2:04 AM  Result Value Ref Range   Prothrombin Time 14.5 11.4 - 15.2 seconds   INR 1.14     Comment: Performed at [facility], [place] [address] [place], [place] [address]  Type and screen [place]     Status: None   Collection Time: 09/02/17  2:07 AM  Result Value Ref Range   ABO/RH(D) O POS    Antibody Screen NEG    Sample Expiration      09/05/2017 Performed at [facility], [place] [address], [place], [place] [address]   ABO/Rh     Status: None   Collection Time:  09/02/17  2:07 AM  Result Value Ref Range   ABO/RH(D)      O POS Performed at [facility], [place] [address], [place], [place] [address]   Troponin I     Status: Abnormal   Collection Time: 09/02/17  5:10 AM  Result Value Ref Range   Troponin I 0.04 (HH) <0.03 ng/mL    Comment: CRITICAL RESULT CALLED TO, READ BACK BY AND VERIFIED WITH: K MINGRIA,RN @0600  09/02/17 MKELLY Performed at [facility], [place] [address], [place], [place] [address]     Dg Chest 1 View  Result Date: 09/02/2017 CLINICAL DATA:  Hip fracture EXAM: CHEST  1 VIEW COMPARISON:  04/06/2015 FINDINGS: Elevation of the right diaphragm. Left-sided pacing device as before. Atelectasis at the right base. No acute consolidation or effusion. Stable cardiomediastinal silhouette. No pneumothorax. Postsurgical changes of the left humerus. IMPRESSION: No active disease.  Stable elevation of the right diaphragm. Electronically Signed   By: Donavan Foil M.D.   On: 09/02/2017 03:00   Dg Hip Unilat With Pelvis 2-3 Views Left  Result Date: 09/02/2017 CLINICAL DATA:  Left hip pain EXAM: DG HIP (WITH OR WITHOUT PELVIS) 2-3V LEFT COMPARISON:  None. FINDINGS: Right femoral head projects in joint. Pubic symphysis and rami appear intact. Acute left femoral neck fracture with varus angulation and cephalad migration of the distal femur. About 1 bone with of anterior displacement of distal fracture fragment. IMPRESSION: Acute displaced and mildly angulated left femoral neck fracture Electronically Signed   By: Donavan Foil M.D.   On: 09/02/2017 02:54    Review of Systems  Constitutional: Negative.   HENT: Negative.   Eyes: Negative.   Respiratory: Negative.   Cardiovascular: Negative.   Gastrointestinal: Negative.   Genitourinary: Negative.   Musculoskeletal: Positive for joint pain.  Skin: Negative.   Neurological: Negative.   Endo/Heme/Allergies: Negative.   Psychiatric/Behavioral: Positive for memory  loss.    Blood pressure 119/62, pulse 92, temperature 98.2 F (36.8 C), temperature source Oral, resp. rate 17, height 5' 5"  (1.651 m), weight 65.8 kg (145 lb), SpO2 97 %. Physical Exam  Constitutional: She is oriented to person, place, and time. She appears well-developed.  HENT:  Head: Normocephalic.  Eyes: Pupils are equal, round, and reactive to light.  Neck: Neck supple. No JVD present. No tracheal deviation present. No thyromegaly present.  Cardiovascular: Normal rate, regular rhythm and intact distal pulses.  Respiratory: Effort normal and breath sounds normal. No respiratory distress. She has no wheezes.  GI: Soft. There is no tenderness. There is no guarding.  Musculoskeletal:       Left hip: She exhibits decreased range of motion, decreased strength, tenderness and bony tenderness. She exhibits no swelling, no deformity and no laceration.  Lymphadenopathy:    She has no cervical adenopathy.  Neurological: She is alert and oriented to person, place, and time.  Skin: Skin is warm and dry.  Psychiatric: She has a normal mood and affect. Cognition and memory are impaired.    Assessment/Plan: Left femoral neck fracture   Discussion was had with the family and the granddaughter wants to proceed with surgery to help pain control.  Risks, benefits and expectations were discussed with the patient's family member.  Risks including but not limited to the risk of anesthesia, blood clots, nerve damage, blood vessel damage, failure of the prosthesis, infection and up to and including death.  Patient's family member understands the risks, benefits and expectations and wishes to proceed with surgery.   NPO now  Plan for left hip hemiarthroplasty    Lucille Passy [facility] 09/02/2017, 10:11 AM

## 2017-09-03 ENCOUNTER — Encounter (HOSPITAL_COMMUNITY): Payer: Self-pay

## 2017-09-03 DIAGNOSIS — D62 Acute posthemorrhagic anemia: Secondary | ICD-10-CM | POA: Diagnosis present

## 2017-09-03 LAB — COMPREHENSIVE METABOLIC PANEL
ALT: 20 U/L (ref 14–54)
AST: 36 U/L (ref 15–41)
Albumin: 2.2 g/dL — ABNORMAL LOW (ref 3.5–5.0)
Alkaline Phosphatase: 49 U/L (ref 38–126)
Anion gap: 9 (ref 5–15)
BILIRUBIN TOTAL: 0.5 mg/dL (ref 0.3–1.2)
BUN: 32 mg/dL — ABNORMAL HIGH (ref 6–20)
CALCIUM: 7.8 mg/dL — AB (ref 8.9–10.3)
CHLORIDE: 110 mmol/L (ref 101–111)
CO2: 24 mmol/L (ref 22–32)
CREATININE: 1 mg/dL (ref 0.44–1.00)
GFR, EST AFRICAN AMERICAN: 54 mL/min — AB (ref >60)
GFR, EST NON AFRICAN AMERICAN: 47 mL/min — AB (ref >60)
Glucose, Bld: 229 mg/dL — ABNORMAL HIGH (ref 65–99)
Potassium: 4.4 mmol/L (ref 3.5–5.1)
Sodium: 143 mmol/L (ref 135–145)
TOTAL PROTEIN: 5.7 g/dL — AB (ref 6.5–8.1)

## 2017-09-03 LAB — CBC
HCT: 21.7 % — ABNORMAL LOW (ref 36.0–46.0)
Hemoglobin: 6.8 g/dL — CL (ref 12.0–15.0)
MCH: 29.4 pg (ref 26.0–34.0)
MCHC: 31.3 g/dL (ref 30.0–36.0)
MCV: 93.9 fL (ref 78.0–100.0)
PLATELETS: 310 10*3/uL (ref 150–400)
RBC: 2.31 MIL/uL — AB (ref 3.87–5.11)
RDW: 14.9 % (ref 11.5–15.5)
WBC: 14.8 10*3/uL — AB (ref 4.0–10.5)

## 2017-09-03 LAB — HEMOGLOBIN AND HEMATOCRIT, BLOOD
HCT: 30.4 % — ABNORMAL LOW (ref 36.0–46.0)
Hemoglobin: 10 g/dL — ABNORMAL LOW (ref 12.0–15.0)

## 2017-09-03 LAB — GLUCOSE, CAPILLARY
GLUCOSE-CAPILLARY: 153 mg/dL — AB (ref 65–99)
Glucose-Capillary: 167 mg/dL — ABNORMAL HIGH (ref 65–99)
Glucose-Capillary: 138 mg/dL — ABNORMAL HIGH (ref 65–99)

## 2017-09-03 LAB — PREPARE RBC (CROSSMATCH)

## 2017-09-03 LAB — LACTIC ACID, PLASMA: Lactic Acid, Venous: 1.2 mmol/L (ref 0.5–1.9)

## 2017-09-03 MED ORDER — MORPHINE SULFATE (PF) 4 MG/ML IV SOLN
2.0000 mg | INTRAVENOUS | Status: AC | PRN
  Administered 2017-09-03 – 2017-09-06 (×7): 2 mg via INTRAVENOUS
  Filled 2017-09-03 (×7): qty 1

## 2017-09-03 MED ORDER — ASPIRIN 300 MG RE SUPP: 300.0000 mg | Freq: Every day | RECTAL | Status: DC

## 2017-09-03 MED ORDER — SODIUM CHLORIDE 0.9 % IV BOLUS
1000.0000 mL | Freq: Once | INTRAVENOUS | Status: AC
  Administered 2017-09-03: 1000 mL via INTRAVENOUS

## 2017-09-03 MED ORDER — ALBUMIN HUMAN 25 % IV SOLN
25.0000 g | Freq: Once | INTRAVENOUS | Status: AC
  Administered 2017-09-03: 25 g via INTRAVENOUS
  Filled 2017-09-03: qty 100

## 2017-09-03 MED ORDER — SODIUM CHLORIDE 0.9 % IV SOLN
Freq: Once | INTRAVENOUS | Status: AC
  Administered 2017-09-03: 07:00:00 via INTRAVENOUS

## 2017-09-03 MED ORDER — SODIUM CHLORIDE 0.9 % IV SOLN
Freq: Once | INTRAVENOUS | Status: AC
  Administered 2017-09-03: 10:00:00 via INTRAVENOUS

## 2017-09-03 MED ORDER — BISACODYL 10 MG RE SUPP
10.0000 mg | Freq: Once | RECTAL | Status: AC
  Administered 2017-09-03: 10 mg via RECTAL
  Filled 2017-09-03: qty 1

## 2017-09-03 NOTE — Progress Notes (Signed)
Upon assessment at 2200, this RN found Pts BP to be 85/53 with HR of 107. Rapid response nurse called. Bolus given. BP after bolus 107/55. BP rechecked an hour later was 80/49. MD on call notified and rapid response nurse called again. 500cc bolus was ordered. BP after second bolus was 82/49. MD on call notified and 1000cc bolus ordered. BP in middle of bolus administration was 120/69 with HR of 86. Will continue to monitor patient closely.

## 2017-09-03 NOTE — Progress Notes (Signed)
Patient Demographics:    Sherri Leonard, is a 84 y.o. female, DOB - 11/14/1920, ZOX:[id number]  Admit date - 09/02/2017   Admitting Physician Kaleel Schmieder Mariea ClontsEmokpae, MD  Outpatient Primary MD for the patient is Sherri Leonard, S Ahmed, MD  LOS - 1   Chief Complaint  Patient presents with  . Hip Injury        Subjective:    [name] today has no fevers, no emesis,  No chest pain, rapid response was called overnight due to persistent hypotension in the setting of acute blood loss with hemoglobin down to 6.8 from 9.2 the previous day  Assessment  & Plan :    Principal Problem:   Closed left hip fracture [facility](HCC) Active Problems:   Hip pain   Acute blood loss as cause of postoperative anemia  Brief Summary:- 84 y.o. female, with severe/advanced dementia admitted on 09/02/2017 from [facility] for left femoral neck fracture. Pt had been moaning for the past several days and uncertain when injury occurred .  Facility did xray and fracture was seen.  Underwent ORIF by Dr Charlann BoxerLin on 09/02/17... Overall doing poorly, granddaughter/POA [name] would like to discuss goals of care with palliative care team prior to making further decisions. patient has significant/severe cognitive deficits consistent with advanced dementia -patient has generalized weakness without new focal deficits , prior to admission patient was nonambulatory    Plan:- 1)Lt Hip Fx-  ORIF on 09/02/17 by Dr Raylene EvertsMathew Olin, further management per orthopedic team, stop aspirin due to significant drop in hemoglobin and concerns about bleeding  2)AKI----acute kidney injury  -     creatinine on admission= 1.2  ,   baseline creatinine = 0.7   , creatinine is now=  1.0    ,  Avoid nephrotoxic agents/dehydration/hypotension  3)FEN-change IV fluids to dextrose solution while the patient remains n.p.o., cbg q 6 hrs   4)  symptomatic acute blood loss anemia--due to postop blood loss, hemoglobin down to 6.8 from 9.2 the previous day, pt had persistent hypotension and hemodynamic instability requiring IV fluids and transfusion of 2 units of packed cells on 09/03/2017--- blood pressure improved after IV fluids and transfusion, H&H and transfuse as clinically indicated  5)Social/Ethics-discussed with granddaughter/POA Ms Sherri RunningArmina Leonard, patient remains a full code with full scope of treatment at this time, awaiting official palliative care consult to help delineate goals of care, patient has significant/severe cognitive deficits consistent with advanced dementia -patient has generalized weakness without new focal deficits , prior to admission patient was nonambulatory    Code Status : Full coe   Disposition Plan  : TBD   Consults  :  Ortho/palliative   DVT Prophylaxis  :  SCDs /subcu heparin discontinued due to significant drop in hemoglobin with hemodynamic instability  Lab Results  Component Value Date   PLT 310 09/03/2017    Inpatient Medications  Scheduled Meds: . chlorhexidine  60 mL Topical Once  . povidone-iodine  2 application Topical Once  . sodium chloride flush  3 mL Intravenous Q12H   Continuous Infusions: . sodium chloride    . dextrose 5 % and 0.45% NaCl 50 mL/hr at 09/03/17 0813  . sodium chloride     PRN Meds:.sodium chloride, acetaminophen **  OR** acetaminophen, albuterol, alum & mag hydroxide-simeth, baclofen, menthol-cetylpyridinium **OR** phenol, metoCLOPramide **OR** metoCLOPramide (REGLAN) injection, morphine injection, ondansetron **OR** ondansetron (ZOFRAN) IV, sodium chloride flush    Anti-infectives (From admission, onward)   Start     Dose/Rate Route Frequency Ordered Stop   09/02/17 2000  ceFAZolin (ANCEF) IVPB 2g/100 mL premix     2 g 200 mL/hr over 30 Minutes Intravenous Every 6 hours 09/02/17 1710 09/03/17 0324   09/02/17 1400  ceFAZolin (ANCEF) IVPB 2g/100 mL  premix     2 g 200 mL/hr over 30 Minutes Intravenous On call to O.R. 09/02/17 1244 09/02/17 1359   09/02/17 1231  ceFAZolin (ANCEF) 2-4 GM/100ML-% IVPB    Note to Pharmacy:  Curlene Dolphin   : cabinet override      09/02/17 1231 09/02/17 1344        Objective:   Vitals:   09/03/17 1101 09/03/17 1138 09/03/17 1146 09/03/17 1413  BP: (!) 164/101 123/64 123/64 122/82  Pulse: 78  85 84  Resp: 18   18  Temp: 98.8 F (37.1 C)   98.9 F (37.2 C)  TempSrc: Axillary   Rectal  SpO2: 100%   100%  Weight:      Height:        Wt Readings from Last 3 Encounters:  09/02/17 65.8 kg (145 lb)  10/22/16 67.1 kg (148 lb)  08/28/16 67 kg (147 lb 9.6 oz)     Intake/Output Summary (Last 24 hours) at 09/03/2017 1701 Last data filed at 09/03/2017 1413 Gross per 24 hour  Intake 1620.67 ml  Output 375 ml  Net 1245.67 ml     Physical Exam  Gen:-More awake after transfusion HEENT:- [place].AT, No sclera icterus Neck-Supple Neck,No JVD,.  Lungs-  CTAB , good air movement CV- S1, S2 normal Abd-  +ve B.Sounds, Abd Soft, No tenderness,    Extremity/Skin:- No  edema, postop wound in the left hip is clean dry and intact Psych-patient has significant/severe cognitive deficits consistent with advanced dementia Neuro -patient has generalized weakness without new focal deficits , prior to admission patient was nonambulatory   Data Review:   Micro Results Recent Results (from the past 240 hour(s))  MRSA PCR Screening     Status: None   Collection Time: 09/02/17  5:17 PM  Result Value Ref Range Status   MRSA by PCR NEGATIVE NEGATIVE Final    Comment:        The GeneXpert MRSA Assay (FDA approved for NASAL specimens only), is one component of a comprehensive MRSA colonization surveillance program. It is not intended to diagnose MRSA infection nor to guide or monitor treatment for MRSA infections. Performed at [facility], [address] [place], [place] [address]      Radiology Reports Dg Chest 1 View  Result Date: 09/02/2017 CLINICAL DATA:  Hip fracture EXAM: CHEST  1 VIEW COMPARISON:  04/06/2015 FINDINGS: Elevation of the right diaphragm. Left-sided pacing device as before. Atelectasis at the right base. No acute consolidation or effusion. Stable cardiomediastinal silhouette. No pneumothorax. Postsurgical changes of the left humerus. IMPRESSION: No active disease.  Stable elevation of the right diaphragm. Electronically Signed   By: Jasmine Pang M.D.   On: 09/02/2017 03:00   Pelvis Portable  Result Date: 09/02/2017 CLINICAL DATA:  The patient suffered a left femoral neck fracture in a recent fall. Date of the fall is unknown. Status post left hip replacement. EXAM: PORTABLE PELVIS 1-2 VIEWS COMPARISON:  Plain films left hip 09/02/2017.  FINDINGS: New left hip hemiarthroplasty is in place. The device is located. No fracture. Gas in the soft tissues from surgery noted. IMPRESSION: Status post left hip replacement.  No acute finding. Electronically Signed   By: Drusilla Kanner M.D.   On: 09/02/2017 16:14   Dg Hip Unilat With Pelvis 2-3 Views Left  Result Date: 09/02/2017 CLINICAL DATA:  Left hip pain EXAM: DG HIP (WITH OR WITHOUT PELVIS) 2-3V LEFT COMPARISON:  None. FINDINGS: Right femoral head projects in joint. Pubic symphysis and rami appear intact. Acute left femoral neck fracture with varus angulation and cephalad migration of the distal femur. About 1 bone with of anterior displacement of distal fracture fragment. IMPRESSION: Acute displaced and mildly angulated left femoral neck fracture Electronically Signed   By: Jasmine Pang M.D.   On: 09/02/2017 02:54     CBC Recent Labs  Lab 09/02/17 0204 09/03/17 0341  WBC 14.7* 14.8*  HGB 9.2* 6.8*  HCT 29.0* 21.7*  PLT 341 310  MCV 94.8 93.9  MCH 30.1 29.4  MCHC 31.7 31.3  RDW 14.8 14.9  LYMPHSABS 1.7  --   MONOABS 0.9  --   EOSABS 0.1  --   BASOSABS 0.0  --     Chemistries  Recent Labs   Lab 09/02/17 0204 09/03/17 0341  NA 144 143  K 4.2 4.4  CL 110 110  CO2 24 24  GLUCOSE 169* 229*  BUN 37* 32*  CREATININE 1.20* 1.00  CALCIUM 8.8* 7.8*  AST  --  36  ALT  --  20  ALKPHOS  --  49  BILITOT  --  0.5   ------------------------------------------------------------------------------------------------------------------ No results for input(s): CHOL, HDL, LDLCALC, TRIG, CHOLHDL, LDLDIRECT in the last 72 hours.  Lab Results  Component Value Date   HGBA1C 6.6 07/27/2016   ------------------------------------------------------------------------------------------------------------------ No results for input(s): TSH, T4TOTAL, T3FREE, THYROIDAB in the last 72 hours.  Invalid input(s): FREET3 ------------------------------------------------------------------------------------------------------------------ No results for input(s): VITAMINB12, FOLATE, FERRITIN, TIBC, IRON, RETICCTPCT in the last 72 hours.  Coagulation profile Recent Labs  Lab 09/02/17 0204  INR 1.14    No results for input(s): DDIMER in the last 72 hours.  Cardiac Enzymes Recent Labs  Lab 09/02/17 0510  TROPONINI 0.04*   ------------------------------------------------------------------------------------------------------------------ No results found for: BNP   Shon Hale M.D on 09/03/2017 at 5:01 PM  Between 7am to 7pm - Pager - [phone]  After 7pm go to [email or web address] - password [email or web address]  [facility] -  Office  [phone]   Voice Recognition Reubin Milan dictation system was used to create this note, attempts have been made to correct errors. Please contact the author with questions and/or clarifications.

## 2017-09-03 NOTE — Evaluation (Signed)
Physical Therapy One Time Evaluation Patient Details Name: Sherri Leonard MRN: [id number] DOB: 07/15/1920 Today's Date: 09/03/2017   History of Present Illness  Pt is a 84 year old female admitted from facility for pain and found to have left femoral neck fracture, s/p L hip hemiarthroplasty 09/02/17. PMHx significant for dementia, brain surgery, pacemaker  Clinical Impression  Patient evaluated by Physical Therapy with no further acute PT needs identified. All education has been completed and the patient has no further questions.  Pt admitted from facility and found to have femoral fracture.  Pt with posterior hip precautions however has dementia as well as flexion contractures which limit ability to fully maintain.  RN in room for assessment and educated on how best to assist pt with rolling and repositioning while attempting to maintain precautions.  Pt nonverbal and only moaning during session.  Pt not appropriate for skilled PT at this time.  Pt will likely require higher level of care upon d/c as she requires total care at this time. PT is signing off. Thank you for this referral.     Follow Up Recommendations No PT follow up(will likely need higher level of care, unable to participate in PT at this time)    Equipment Recommendations  Hospital bed    Recommendations for Other Services       Precautions / Restrictions Precautions Precautions: Fall;Posterior Hip Precaution Comments: reviewed posterior hip precautions with RN Required Braces or Orthoses: Knee Immobilizer - Left Restrictions Other Position/Activity Restrictions: WBAT      Mobility  Bed Mobility Overal bed mobility: Needs Assistance Bed Mobility: Rolling Rolling: +2 for safety/equipment;+2 for physical assistance;Total assist         General bed mobility comments: pt in right sidelying with increased hip and trunk flexion upon arrival, attempted to reposition as best as possible (appears to have flexion  contractures throughout) to maintain posterior hip precautions and educated RN on how to assist pt with rolling  Transfers                    Ambulation/Gait                Stairs            Wheelchair Mobility    Modified Rankin (Stroke Patients Only)       Balance                                             Pertinent Vitals/Pain Pain Assessment: Faces Faces Pain Scale: Hurts whole lot Pain Location: pt unable to verbalize, assume L operative hip Pain Descriptors / Indicators: Moaning Pain Intervention(s): Premedicated before session;Limited activity within patient's tolerance;Repositioned;Monitored during session(RN reports pt given tylenol suppository and IV pain meds)    Home Living Family/patient expects to be discharged to:: Skilled nursing facility                      Prior Function Level of Independence: Needs assistance         Comments: pt unable to provide history, no family present, per notes, admitted after fall out of w/c     Hand Dominance        Extremity/Trunk Assessment   Upper Extremity Assessment Upper Extremity Assessment: LUE deficits/detail;RUE deficits/detail RUE Deficits / Details: flexion contractures appearance, not allowing or/and unable to perform extension of  UEs throughout    Lower Extremity Assessment Lower Extremity Assessment: LLE deficits/detail;RLE deficits/detail;Generalized weakness RLE Deficits / Details: pt remains in bilateral hip flexion posture despite attempt to perform extension with positioning and supine    Cervical / Trunk Assessment Cervical / Trunk Assessment: Other exceptions Cervical / Trunk Exceptions: forward flexion, not tolerating spinal extension, preference for right lateral lean/sidelying  Communication   Communication: Receptive difficulties;Expressive difficulties(only moaning today, no other communication)  Cognition Arousal/Alertness: (keeps eyes  closed, not interactive)                                     General Comments: hx of dementia, moaning with positioning       General Comments      Exercises     Assessment/Plan    PT Assessment Patent does not need any further PT services  PT Problem List         PT Treatment Interventions      PT Goals (Current goals can be found in the Care Plan section)  Acute Rehab PT Goals PT Goal Formulation: All assessment and education complete, DC therapy    Frequency     Barriers to discharge        Co-evaluation               AM-PAC PT "6 Clicks" Daily Activity  Outcome Measure Difficulty turning over in bed (including adjusting bedclothes, sheets and blankets)?: Unable Difficulty moving from lying on back to sitting on the side of the bed? : Unable Difficulty sitting down on and standing up from a chair with arms (e.g., wheelchair, bedside commode, etc,.)?: Unable Help needed moving to and from a bed to chair (including a wheelchair)?: Total Help needed walking in hospital room?: Total Help needed climbing 3-5 steps with a railing? : Total 6 Click Score: 6    End of Session Equipment Utilized During Treatment: Left knee immobilizer Activity Tolerance: Patient limited by pain;Other (comment)(limited by cognition) Patient left: in bed;with call bell/phone within reach(RN aware of pt positioning in trendelenburg type position, unable to set alarm, requested floor mats) Nurse Communication: Mobility status;Precautions PT Visit Diagnosis: Pain Pain - Right/Left: Left Pain - part of body: Hip    Time: 6295-2841 PT Time Calculation (min) (ACUTE ONLY): 17 min   Charges:   PT Evaluation $PT Eval Low Complexity: 1 Low     PT G CodesZenovia Jarred, PT, DPT 09/03/2017 Pager: [phone]  Maida Sale E 09/03/2017, 11:36 AM

## 2017-09-03 NOTE — Progress Notes (Signed)
CRITICAL VALUE ALERT  Critical Value:  Hemoglobin 6.8  Date & Time Notied:  09/03/2017 0400  Provider Notified: T. Opyd MD  Orders Received/Actions taken: awaiting orders

## 2017-09-03 NOTE — Progress Notes (Signed)
Chart reviewed. Spoke with granddaughter, Jobe Igormina [place], via telephone. GOC meeting arranged for tomorrow, 4/18 around 11am. Thank you.   NO CHARGE  Vennie HomansMegan Chrystian Ressler, FNP-C Palliative Medicine Team  Phone: [phone] Fax: [phone]

## 2017-09-03 NOTE — Progress Notes (Signed)
PT Cancellation Note  Patient Details Name: Sherri RadMable Kirchhoff MRN: [id number] DOB: 04/06/1921   Cancelled Treatment:    Reason Eval/Treat Not Completed: Medical issues which prohibited therapy(low Hgb, PRBCs ordered) Will check back as schedule permits.   Avamae Dehaan,KATHrine E 09/03/2017, 8:33 AM Zenovia JarredKati Drayven Marchena, PT, DPT 09/03/2017 Pager: [phone]

## 2017-09-04 DIAGNOSIS — Z515 Encounter for palliative care: Secondary | ICD-10-CM

## 2017-09-04 DIAGNOSIS — M25552 Pain in left hip: Secondary | ICD-10-CM

## 2017-09-04 DIAGNOSIS — F0390 Unspecified dementia without behavioral disturbance: Secondary | ICD-10-CM

## 2017-09-04 DIAGNOSIS — Z7189 Other specified counseling: Secondary | ICD-10-CM

## 2017-09-04 LAB — BASIC METABOLIC PANEL
Anion gap: 9 (ref 5–15)
BUN: 26 mg/dL — ABNORMAL HIGH (ref 6–20)
CALCIUM: 8.5 mg/dL — AB (ref 8.9–10.3)
CO2: 24 mmol/L (ref 22–32)
CREATININE: 0.8 mg/dL (ref 0.44–1.00)
Chloride: 112 mmol/L — ABNORMAL HIGH (ref 101–111)
GFR calc non Af Amer: >60 mL/min (ref >60)
Glucose, Bld: 138 mg/dL — ABNORMAL HIGH (ref 65–99)
Potassium: 3.9 mmol/L (ref 3.5–5.1)
Sodium: 145 mmol/L (ref 135–145)

## 2017-09-04 LAB — CBC
HCT: 33.8 % — ABNORMAL LOW (ref 36.0–46.0)
Hemoglobin: 11 g/dL — ABNORMAL LOW (ref 12.0–15.0)
MCH: 30 pg (ref 26.0–34.0)
MCHC: 32.5 g/dL (ref 30.0–36.0)
MCV: 92.1 fL (ref 78.0–100.0)
PLATELETS: 248 10*3/uL (ref 150–400)
RBC: 3.67 MIL/uL — AB (ref 3.87–5.11)
RDW: 15.8 % — ABNORMAL HIGH (ref 11.5–15.5)
WBC: 15 10*3/uL — ABNORMAL HIGH (ref 4.0–10.5)

## 2017-09-04 LAB — GLUCOSE, CAPILLARY: GLUCOSE-CAPILLARY: 135 mg/dL — AB (ref 65–99)

## 2017-09-04 NOTE — Consult Note (Addendum)
Consultation Note Date: 09/04/2017   Patient Name: [facility]  DOB: 1920/10/05  MRN: [id number]  Age / Sex: 84 y.o., female  PCP: Jodi Marble, MD Referring Physician: Elodia Florence., *  Reason for Consultation: Establishing goals of care  HPI/Patient Profile: 84 y.o. female  with past medical history of dementia, osteoarthritis, and pacemaker admitted on 09/02/2017 with left hip pain from nursing facility. Per facility, patient had been moaning for several days and uncertain when injury occurred. SNF performed xray which revealed left femoral neck fracture. Ortho following and ORIF performed on 09/02/17. Patient with baseline dementia. PT evaluated and patient unable to participate in therapy. Palliative medicine consultation for goals of care.   Clinical Assessment and Goals of Care: I have reviewed medical records, discussed with care team, and met with patient and granddaughter (Armina) at bedside to discuss diagnosis, prognosis, GOC, EOL wishes, disposition and options.  Introduced Palliative Medicine as specialized medical care for people living with serious illness. It focuses on providing relief from the symptoms and stress of a serious illness. The goal is to improve quality of life for both the patient and the family.  We discussed a brief life review of the patient. Patient was diagnosed with dementia about 10 years ago. Shynice lived with [name] for 8 years. Her functional status started to decline in November 2016 and she was sent to SNF for rehab. Since then, she has lived at nursing facility. Baseline prior to hospitalization, patient wheelchair bound. [name] is disappointed with the SNF because she feels they do not attempt ambulation with her grandmother, therefore leading to further weakness and decline in functional status. [name] considers her grandmother's dementia with mild  degree in cognitive impairment.   Sydne's husband and only daughter have died. Armina (along with two great grandchildren) and the only living kin.   Discussed hospital diagnoses and interventions. Educated on disease trajectory of dementia. [name] speaks of her frustrations with healthcare as a whole, not wanting to care for dementia or elderly patients.   [name] feels the patient is showing improvement in cognition today. She felt it was "too early" for physical therapy to work with her grandmother yesterday when she had a drop in blood pressure and hemoglobin requiring blood transfusion.   Advanced directives, concepts specific to code status, and artifical feeding and hydration were discussed. [name] tells me they had filled out a MOST form in 2017 with FULL code. She spoke with Dr. Joesph Fillers yesterday regarding recommendation for DNR with age and underlying dementia. Reviewed MOST form. Educated on medical recommendation against heroic measures including resuscitation/life support with underlying age, frailty, and dementia. Explained what code blue entails. Encouraged her to review Hard Choices copy and consider limitations to care. She is not ready today to make a decision regarding code status.   [name] asks about next steps in care. Again, she tells me she felt it was too soon to attempt PT. She requests physical therapy attempt to work with [place] again today or tomorrow. I also  explained SLP evaluation to initiate diet if possible. We did discuss continued need for pain medication prn.   [name] speaks of previous experiences with elderly church members whom fell, broke a hip, and eventually led to their death. We did discuss the high risk for mortality within a year in elderly individuals with underlying co-morbidities. She is realistic in the fact that it will be challenging for her grandmother to return to previous baseline. She wishes to give Mikaya a chance to work further with therapy and  pursue a different SNF for rehab before returning to [facility].   Briefly discussed palliative versus hospice options at facility if she does not show clinical improvement.   Questions and concerns were addressed. PMT contact information given.     SUMMARY OF RECOMMENDATIONS    Initial palliative discussion.  Continue FULL code/FULL scope. Encouraged granddaughter to read and review MOST form and Hard Choices copy. Encouraged her to consider medical recommendations against heroic measures such as resuscitation/life support.  Granddaughter believes the patient has shown cognitive improvement today. SLP evaluation for diet recommendation.   Granddaughter also believes yesterday was "too early" to work with physical therapy. Requesting re-evaluation. PT consult placed.   Watchful waiting. Granddaughter hopeful for improvement and disposition plan including physical therapy.   Briefly discussed palliative versus hospice options if she does not show clinical improvement.   PMT will follow.   Code Status/Advance Care Planning:  Full code-educated on medical recommendation for DNR/DNI  Symptom Management:   Continue prn morphine for pain  Palliative Prophylaxis:   Aspiration, Delirium Protocol, Frequent Pain Assessment, Oral Care and Turn Reposition  Psycho-social/Spiritual:   Desire for further Chaplaincy support:yes  Additional Recommendations: Caregiving  Support/Resources and Education on Hospice  Prognosis:   Unable to determine: guarded with hip fracture s/p ORIF, underlying dementia, and continued decline in functional/cognitive/nutritional status.   Discharge Planning: To Be Determined      Primary Diagnoses: Present on Admission: . Closed left hip fracture ([place]) . Hip pain . Acute blood loss as cause of postoperative anemia   I have reviewed the medical record, interviewed the patient and family, and examined the patient. The following aspects are  pertinent.  Past Medical History:  Diagnosis Date  . Dementia   . OA (osteoarthritis)   . Pacemaker    Social History   Socioeconomic History  . Marital status: Widowed    Spouse name: Not on file  . Number of children: 1  . Years of education: Not on file  . Highest education level: Not on file  Occupational History  . Occupation: retired  Scientific laboratory technician  . Financial resource strain: Not on file  . Food insecurity:    Worry: Not on file    Inability: Not on file  . Transportation needs:    Medical: Not on file    Non-medical: Not on file  Tobacco Use  . Smoking status: Never Smoker  . Smokeless tobacco: Never Used  Substance and Sexual Activity  . Alcohol use: No    Alcohol/week: 0.0 oz  . Drug use: No  . Sexual activity: Never  Lifestyle  . Physical activity:    Days per week: Not on file    Minutes per session: Not on file  . Stress: Not on file  Relationships  . Social connections:    Talks on phone: Not on file    Gets together: Not on file    Attends religious service: Not on file    Active  member of club or organization: Not on file    Attends meetings of clubs or organizations: Not on file    Relationship status: Not on file  Other Topics Concern  . Not on file  Social History Narrative   Diet:   Do you drink/eat things with caffeine? No   Marital status:     Widowed                         What year were you married?   Do you live in a house, apartment, assisted living, condo, trailer, etc)? Home   Is it one or more stories? One   How many persons live in your home? Two   Do you have any pets in your home? No   Current or past profession:   Do you exercise?      No                                              Type & how often:   Do you have a living will? Yes   Do you have a DNR Form? Yes   Do you have a POA/HPOA forms? Yes   Family History  Problem Relation Age of Onset  . Heart disease Son   . Cancer Son 60       on Liver  . Alzheimer's  disease Mother   . Alzheimer's disease Father   . Heart attack Son   . Colon polyps Neg Hx   . Diabetes Neg Hx   . Kidney disease Neg Hx    Scheduled Meds: . chlorhexidine  60 mL Topical Once  . povidone-iodine  2 application Topical Once  . sodium chloride flush  3 mL Intravenous Q12H   Continuous Infusions: . sodium chloride    . dextrose 5 % and 0.45% NaCl 50 mL/hr at 09/03/17 2028  . sodium chloride     PRN Meds:.sodium chloride, acetaminophen **OR** acetaminophen, albuterol, alum & mag hydroxide-simeth, baclofen, menthol-cetylpyridinium **OR** phenol, metoCLOPramide **OR** metoCLOPramide (REGLAN) injection, morphine injection, ondansetron **OR** ondansetron (ZOFRAN) IV, sodium chloride flush Medications Prior to Admission:  Prior to Admission medications   Medication Sig Start Date End Date Taking? Authorizing Provider  acetaminophen (TYLENOL) 325 MG tablet Take 650 mg by mouth every 6 (six) hours.  08/28/16  Yes Hendricks Limes, MD  baclofen (LIORESAL) 10 MG tablet Take 5 mg by mouth as needed for muscle spasms.   Yes [provider]  cholecalciferol (VITAMIN D) 1000 UNITS tablet Take 1 tablet (1,000 Units total) by mouth daily. 03/30/15  Yes Lauree Chandler, NP  ENSURE (ENSURE) Take 237 mLs by mouth 3 (three) times daily between meals.   Yes [provider]  Melatonin 3 MG TABS Take 3 mg by mouth at bedtime.    Yes [provider]  Menthol, Topical Analgesic, (BIOFREEZE) 4 % GEL Apply 1 application topically every 8 (eight) hours as needed (muscle pain).   Yes [provider]  sertraline (ZOLOFT) 100 MG tablet Take 125 mg by mouth daily.    Yes [provider]  traMADol (ULTRAM) 50 MG tablet Take 25 mg by mouth 2 (two) times daily.   Yes [provider]   Allergies  Allergen Reactions  . Ativan [Lorazepam] Other (See Comments)    Causes violence   .  Benzodiazepines    Review of Systems  Unable to perform ROS:  Dementia   Physical Exam  Constitutional: She is easily aroused. She appears ill.  HENT:  Head: Normocephalic and atraumatic.  Pulmonary/Chest: No accessory muscle usage. No tachypnea. No respiratory distress.  Neurological: She is alert and easily aroused.  Wakes to voice. Oriented to person. Pleasantly confused. Will answer some questions.   Skin: Skin is warm and dry. There is pallor.  Psychiatric: Her speech is delayed. Cognition and memory are impaired. She is inattentive.  Nursing note and vitals reviewed.  Vital Signs: BP 117/72 (BP Location: Right Arm)   Pulse 90   Temp 98.2 F (36.8 C) (Oral)   Resp 14   Ht 5' 5"  (1.651 m)   Wt 57.2 kg (126 lb 3.2 oz)   SpO2 100%   BMI 21.00 kg/m  Pain Scale: PAINAD   Pain Score: Asleep   SpO2: SpO2: 100 % O2 Device:SpO2: 100 % O2 Flow Rate: .O2 Flow Rate (L/min): 3 L/min  IO: Intake/output summary:   Intake/Output Summary (Last 24 hours) at 09/04/2017 1715 Last data filed at 09/04/2017 1400 Gross per 24 hour  Intake 1200 ml  Output 375 ml  Net 825 ml    LBM: Last BM Date: 09/03/17 Baseline Weight: Weight: 65.8 kg (145 lb) Most recent weight: Weight: 57.2 kg (126 lb 3.2 oz)     Palliative Assessment/Data: PPS 30%   Flowsheet Rows     Most Recent Value  Intake Tab  Referral Department  Hospitalist  Unit at Time of Referral  Med/Surg Unit  Palliative Care Primary Diagnosis  -- [dementia, hip fracture s/p ORIF]  Palliative Care Type  New Palliative care  Reason for referral  Clarify Goals of Care  Date first seen by Palliative Care  09/04/17  Clinical Assessment  Palliative Performance Scale Score  30%  Psychosocial & Spiritual Assessment  Palliative Care Outcomes  Patient/Family meeting held?  Yes  Who was at the meeting?  granddaughter  Palliative Care Outcomes  Clarified goals of care, Counseled regarding hospice, Provided psychosocial or spiritual support, ACP counseling assistance, Provided end of life care  assistance      Time In: 1100  Time Out: 1210 Time Total: 71mn Greater than 50%  of this time was spent counseling and coordinating care related to the above assessment and plan.  Signed by:  MIhor Dow FNP-C Palliative Medicine Team  Phone: [phone]: [phone]  Please contact Palliative Medicine Team phone at [phone] questions and concerns.  For individual provider: See AShea Evans

## 2017-09-04 NOTE — Care Management Note (Signed)
Case Management Note  Patient Details  Name: Sherri RadMable Freeney MRN: [id number] Date of Birth: 01/17/1921  Subjective/Objective:  84 y/o f admitted w/Closed L hip fx. POD#1 L hip hemi. From SNF. PT-unable to participate in PT. Palliative cons GOC in am. CSW notified.                  Action/Plan:d/c SNF.   Expected Discharge Date:  (unknown)               Expected Discharge Plan:  Skilled Nursing Facility  In-House Referral:  Clinical Social Work  Discharge planning Services  CM Consult  Post Acute Care Choice:    Choice offered to:     DME Arranged:    DME Agency:     HH Arranged:    HH Agency:     Status of Service:  In process, will continue to follow  If discussed at Long Length of Stay Meetings, dates discussed:    Additional Comments:  Lanier ClamMahabir, Zakkary Thibault, RN 09/04/2017, 10:48 AM

## 2017-09-04 NOTE — Progress Notes (Signed)
PROGRESS NOTE    [facility]  ZOX:[id number] DOB: 03/07/1921 DOA: 09/02/2017 PCP: [facility]   Brief Narrative:  84 y.o.female,with severe/advanced dementia admitted on 09/02/2017 from [facility] for left femoral neck fracture.  Assessment & Plan:   Principal Problem:   Closed left hip fracture [facility]) Active Problems:   Hip pain   Acute blood loss as cause of postoperative anemia   # Lt Hip Fx-  ORIF on 09/02/17 by Dr [facility], further management per orthopedic team,. ASA for DVT ppx currently being held due to post op ABLA, but recently H/H stable, consider restarting tomorrow if H/H remains stable. Pain control  # AKI: baseline creatinine 0.7, up to 1.2 on admission.  Improving.  Follow  # Dementia  AMS: pt currently NPO, it sounds like 2/2 AMS and concern for ability to safely take PO.   - follow speech eval - continue IVF  # symptomatic acute blood loss anemia - due to postop blood loss, hemoglobin down to 6.8 from 9.2.  Pt had persistent hypotension and hemodynamic instability requiring IV fluids and transfusion of 2 units of packed cells on 09/03/2017.  - h/h stable, follow  # Leukocytosis: follow  # diarrhea:  Loose stools noted by nursing.  Will continue to follow.  Had stool softeners/laxatives ordered before, but doesn't look like she's had these recently.  Follow and consider testing if persistent.  # Goals of care: palliative care c/s (pt overall doing poorly and granddaughter/POA [facility] was interested in discussing goals of care with palliative team.  DVT prophylaxis: SCD Code Status: full  Family Communication: none at bedside Disposition Plan: pending improvement   Consultants:   Orthopedics  Palliative care  Procedures:  L hip hemiarthroplasty 4/16  Antimicrobials:  Anti-infectives (From admission, onward)   Start     Dose/Rate Route Frequency Ordered Stop   09/02/17 2000  ceFAZolin  (ANCEF) IVPB 2g/100 mL premix     2 g 200 mL/hr over 30 Minutes Intravenous Every 6 hours 09/02/17 1710 09/03/17 0324   09/02/17 1400  ceFAZolin (ANCEF) IVPB 2g/100 mL premix     2 g 200 mL/hr over 30 Minutes Intravenous On call to O.R. 09/02/17 1244 09/02/17 1359   09/02/17 1231  ceFAZolin (ANCEF) 2-4 GM/100ML-% IVPB    Note to Pharmacy:  [facility]   : cabinet override      09/02/17 1231 09/02/17 1344     Subjective: [facility]&Ox0. Moaning when I talk to her.  Objective: Vitals:   09/03/17 2200 09/04/17 0209 09/04/17 0559 09/04/17 1308  BP: 100/60 103/85 122/72 117/72  Pulse: 95 97 94 90  Resp: 19 14 14 14   Temp: 99.1 F (37.3 C) 98.5 F (36.9 C) 98.7 F (37.1 C) 98.2 F (36.8 C)  TempSrc: Axillary Axillary Oral Oral  SpO2: 100% 98% 100%   Weight:   57.2 kg (126 lb 3.2 oz)   Height:        Intake/Output Summary (Last 24 hours) at 09/04/2017 1631 Last data filed at 09/04/2017 1400 Gross per 24 hour  Intake 1200 ml  Output 825 ml  Net 375 ml   Filed Weights   09/02/17 0116 09/04/17 0559  Weight: 65.8 kg (145 lb) 57.2 kg (126 lb 3.2 oz)    Examination:  General exam: Appears calm and comfortable, chronically ill appearing Respiratory system: Clear to auscultation. Respiratory effort normal. Cardiovascular system: S1 & S2 heard, RRR. No JVD, murmurs, rubs, gallops or clicks. No  pedal edema. Gastrointestinal system: Abdomen is nondistended, soft and nontender. No organomegaly or masses felt. Normal bowel sounds heard. Central nervous system: disoriented.  Moving all extremities. Extremities: laying on L hip Skin: No rashes, lesions or ulcers    Data Reviewed: I have personally reviewed following labs and imaging studies  CBC: Recent Labs  Lab 09/02/17 0204 09/03/17 0341 09/03/17 2028 09/04/17 0520  WBC 14.7* 14.8*  --  15.0*  NEUTROABS 12.0*  --   --   --   HGB 9.2* 6.8* 10.0* 11.0*  HCT 29.0* 21.7* 30.4* 33.8*  MCV 94.8 93.9  --  92.1  PLT 341 310  --   248   Basic Metabolic Panel: Recent Labs  Lab 09/02/17 0204 09/03/17 0341 09/04/17 0520  NA 144 143 145  K 4.2 4.4 3.9  CL 110 110 112*  CO2 24 24 24   GLUCOSE 169* 229* 138*  BUN 37* 32* 26*  CREATININE 1.20* 1.00 0.80  CALCIUM 8.8* 7.8* 8.5*   GFR: Estimated Creatinine Clearance: 38.7 mL/min (by C-G formula based on SCr of 0.8 mg/dL). Liver Function Tests: Recent Labs  Lab 09/03/17 0341  AST 36  ALT 20  ALKPHOS 49  BILITOT 0.5  PROT 5.7*  ALBUMIN 2.2*   No results for input(s): LIPASE, AMYLASE in the last 168 hours. No results for input(s): AMMONIA in the last 168 hours. Coagulation Profile: Recent Labs  Lab 09/02/17 0204  INR 1.14   Cardiac Enzymes: Recent Labs  Lab 09/02/17 0510  TROPONINI 0.04*   BNP (last 3 results) No results for input(s): PROBNP in the last 8760 hours. HbA1C: No results for input(s): HGBA1C in the last 72 hours. CBG: Recent Labs  Lab 09/03/17 0746 09/03/17 1334 09/03/17 1844 09/04/17 0650  GLUCAP 167* 153* 138* 135*   Lipid Profile: No results for input(s): CHOL, HDL, LDLCALC, TRIG, CHOLHDL, LDLDIRECT in the last 72 hours. Thyroid Function Tests: No results for input(s): TSH, T4TOTAL, FREET4, T3FREE, THYROIDAB in the last 72 hours. Anemia Panel: No results for input(s): VITAMINB12, FOLATE, FERRITIN, TIBC, IRON, RETICCTPCT in the last 72 hours. Sepsis Labs: Recent Labs  Lab 09/03/17 0341  LATICACIDVEN 1.2    Recent Results (from the past 240 hour(s))  MRSA PCR Screening     Status: None   Collection Time: 09/02/17  5:17 PM  Result Value Ref Range Status   MRSA by PCR NEGATIVE NEGATIVE Final    Comment:        The GeneXpert MRSA Assay (FDA approved for NASAL specimens only), is one component of [facility] comprehensive MRSA colonization surveillance program. It is not intended to diagnose MRSA infection nor to guide or monitor treatment for MRSA infections. Performed at [facility], [address] [place], [place] [address]          Radiology Studies: No results found.      Scheduled Meds: . chlorhexidine  60 mL Topical Once  . povidone-iodine  2 application Topical Once  . sodium chloride flush  3 mL Intravenous Q12H   Continuous Infusions: . sodium chloride    . dextrose 5 % and 0.45% NaCl 50 mL/hr at 09/03/17 2028  . sodium chloride       LOS: 2 days    Time spent: over 30 min    Lacretia Nicks, [facility] Pager [phone]  If 7PM-7AM, please contact night-coverage [email or web address] Password [facility] 09/04/2017, 4:31 PM

## 2017-09-04 NOTE — Anesthesia Postprocedure Evaluation (Signed)
Anesthesia Post Note  Patient: [facility]ri [facility]  Procedure(s) Performed: ARTHROPLASTY BIPOLAR HIP (HEMIARTHROPLASTY) (Left Hip)     Patient location during evaluation: PACU Anesthesia Type: General Level of consciousness: awake and alert Pain management: pain level controlled Vital Signs Assessment: post-procedure vital signs reviewed and stable Respiratory status: spontaneous breathing, nonlabored ventilation, respiratory function stable and patient connected to nasal cannula oxygen Cardiovascular status: blood pressure returned to baseline and stable Postop Assessment: no apparent nausea or vomiting Anesthetic complications: no    Last Vitals:  Vitals:   09/04/17 0209 09/04/17 0559  BP: 103/85 122/72  Pulse: 97 94  Resp: 14 14  Temp: 36.9 C 37.1 C  SpO2: 98% 100%    Last Pain:  Vitals:   09/04/17 0559  TempSrc: Oral  PainSc:                  Havoc Sanluis S

## 2017-09-05 LAB — BASIC METABOLIC PANEL
ANION GAP: 9 (ref 5–15)
BUN: 20 mg/dL (ref 6–20)
CHLORIDE: 109 mmol/L (ref 101–111)
CO2: 24 mmol/L (ref 22–32)
Calcium: 8.2 mg/dL — ABNORMAL LOW (ref 8.9–10.3)
Creatinine, Ser: 0.63 mg/dL (ref 0.44–1.00)
GFR calc Af Amer: >60 mL/min (ref >60)
Glucose, Bld: 104 mg/dL — ABNORMAL HIGH (ref 65–99)
POTASSIUM: 3.7 mmol/L (ref 3.5–5.1)
SODIUM: 142 mmol/L (ref 135–145)

## 2017-09-05 LAB — CBC
HEMATOCRIT: 29.4 % — AB (ref 36.0–46.0)
HEMOGLOBIN: 9.5 g/dL — AB (ref 12.0–15.0)
MCH: 30.1 pg (ref 26.0–34.0)
MCHC: 32.3 g/dL (ref 30.0–36.0)
MCV: 93 fL (ref 78.0–100.0)
Platelets: 273 10*3/uL (ref 150–400)
RBC: 3.16 MIL/uL — AB (ref 3.87–5.11)
RDW: 15.3 % (ref 11.5–15.5)
WBC: 11.3 10*3/uL — AB (ref 4.0–10.5)

## 2017-09-05 LAB — MAGNESIUM: Magnesium: 1.4 mg/dL — ABNORMAL LOW (ref 1.7–2.4)

## 2017-09-05 MED ORDER — ASPIRIN 325 MG PO TABS
325.0000 mg | ORAL_TABLET | Freq: Every day | ORAL | Status: DC
  Administered 2017-09-05 – 2017-09-08 (×3): 325 mg via ORAL
  Filled 2017-09-05 (×3): qty 1

## 2017-09-05 MED ORDER — MAGNESIUM SULFATE 2 GM/50ML IV SOLN
2.0000 g | Freq: Once | INTRAVENOUS | Status: AC
  Administered 2017-09-05: 2 g via INTRAVENOUS
  Filled 2017-09-05: qty 50

## 2017-09-05 NOTE — Clinical Social Work Note (Signed)
Clinical Social Work Assessment  Patient Details  Name: Sherri Leonard MRN: [id number] Date of Birth: 13-Jun-1920  Date of referral:  09/05/17               Reason for consult:  Facility Placement, Discharge Planning                Permission sought to share information with:    Permission granted to share information::     Name::        Agency::     Relationship::     Contact Information:     Housing/Transportation Living arrangements for the past 2 months:  Skilled Nursing Facility([facility] Source of Information:  Other (Comment Required)(Granddaughter - Ecologist) Patient Interpreter Needed:  None Criminal Activity/Legal Involvement Pertinent to Current Situation/Hospitalization:  No - Comment as needed Significant Relationships:  Other Family Members(granddaughter) Lives with:  Facility Resident Do you feel safe going back to the place where you live?  (Patient's granddaughter unsure about whether she feels safe with patient dc back to current SNF ([facility])) Need for family participation in patient care:  Yes (Comment)  Care giving concerns: Patient from [facility] (Trinadee Verhagen term care resident). Patient's daughter reported that patient encountered an undocumented fall at SNF and admitted with fracture. PT recommending " Recommend return to SNF. Will let SNF therapy staff determine if they wish to resume PT trial with pt."    Social Worker assessment / plan:  CSW spoke with patient's granddaughter regarding discharge planning, patient disoriented x 4 and unable to participate in assessment. Patient's granddaughter reported that she prefers that patient get ST rehab at [facility]. CSW and patient's granddaughter discussed patient's participation with PT and PT reporting that patient's rehab potential is poor. Patient's granddaughter reported that patient was participating with PT at  SNF prior to hospitalization and that she wants patient to work with PT after discharge. CSW explained that patient's insurance will likely not cover PT for patient due to patient's poor rehab potential, patient's granddaughter reported that she doesn't understand why patient was assessed by PT one day after surgery. CSW explained that PT sees patients and evaluates them when they are ready for evaluation. Patient's daughter requested to speak with patient's attending MD before discussing discharge plans further. Patient's granddaughter reported that she is not sure if she wants patient to return to current SNF and wants to speak with patient's attending MD before making that decision. CSW agreed to update patient's attending MD of patient's granddaughter's request to speak with MD.  CSW updated patient's attending MD. Attending MD provided cell phone number to give to patient's granddaughter.  CSW contacted patient's granddaughter and provided her with attending MD phone number. CSW agreed to follow up with patient's granddaughter after she speaks with attending MD.  CSW will continue to follow and assist with discharge planning.   Employment status:  Retired Health and safety inspector:  Medicaid [place], [employer] PT Recommendations:  No Follow Up(Recommend return to SNF. Will let SNF therapy staff determine if they wish to resume PT trial with pt.) Information / Referral to community resources:  ([facility])  Patient/Family's Response to care:  Patient's granddaughter expressed frustrations about patient working with PT one day after surgery. Patient's granddaughter concerned about patient's discharge plans.   Patient/Family's Understanding of and Emotional Response to Diagnosis, Current Treatment, and Prognosis:  Patient's granddaughter involved in patient's care  and verbalized uncertainty about patient's discharge plans. Patient's granddaughter reported concerns about  patient's fall at SNF being undocumented, CSW validated patient's granddaughter concerns. Patient's granddaughter reported that she has spoke with patient's surgeon and wanted to speak with patient's attending MD before making discharge plans, CSW agreed to follow up and assist patient's granddaughter after she speaks with attending MD. Patient's granddaughter reported that she just wanted patient to get good care.   Emotional Assessment Appearance:    Attitude/Demeanor/Rapport:  Unable to Assess Affect (typically observed):  Unable to Assess Orientation:  (Disoriented x 4) Alcohol / Substance use:  Not Applicable Psych involvement (Current and /or in the community):  No (Comment)  Discharge Needs  Concerns to be addressed:  Care Coordination Readmission within the last 30 days:  No Current discharge risk:  Dependent with Mobility Barriers to Discharge:  Continued Medical Work up   USG CorporationKimberly L Atharva Mirsky, LCSW 09/05/2017, 2:49 PM

## 2017-09-05 NOTE — Care Management Important Message (Signed)
Important Message  Patient Details  Name: Sherri Leonard Jean MRN: [id number] Date of Birth: 01/28/1921   Medicare Important Message Given:  Yes    Caren MacadamFuller, Lisvet Rasheed 09/05/2017, 11:22 AMImportant Message  Patient Details  Name: Sherri Leonard Agrawal MRN: [id number] Date of Birth: 09/30/1920   Medicare Important Message Given:  Yes    Caren MacadamFuller, Zeina Akkerman 09/05/2017, 11:22 AM

## 2017-09-05 NOTE — Plan of Care (Signed)

## 2017-09-05 NOTE — Evaluation (Signed)
Clinical/Bedside Swallow Evaluation Patient Details  Name: Sherri Leonard MRN: [id number] Date of Birth: [birth year]  Today's Date: 09/05/2017 Time: SLP Start Time (ACUTE ONLY): 0934 SLP Stop Time (ACUTE ONLY): 0954 SLP Time Calculation (min) (ACUTE ONLY): 20 min  Past Medical History:  Past Medical History:  Diagnosis Date  . Dementia   . OA (osteoarthritis)   . Pacemaker    Past Surgical History:  Past Surgical History:  Procedure Laterality Date  . ATRIAL CARDIAC PACEMAKER INSERTION  2012  . BRAIN SURGERY  2013  . CARPAL TUNNEL RELEASE Bilateral   . FLEXIBLE SIGMOIDOSCOPY N/A 09/12/2014   Procedure: FLEXIBLE SIGMOIDOSCOPY;  Surgeon: Jerene Bears, MD;  Location: [facility];  Service: Gastroenterology;  Laterality: N/A;  . HIP ARTHROPLASTY Left 09/02/2017   Procedure: ARTHROPLASTY BIPOLAR HIP (HEMIARTHROPLASTY);  Surgeon: Paralee Cancel, MD;  Location: [facility];  Service: Orthopedics;  Laterality: Left;  . KNEE ARTHROPLASTY Bilateral    HPI:  84 y.o.female,withsevere/advanceddementiaadmitted on 4/16/2019from [place] for left femoral neck fracture.  Underwent ORIF 4/16. Granddaughter has met with Palliative Medicine to establish [place] - at this point plan to is to provide opportunity for therapy.     Assessment / Plan / Recommendation Clinical Impression  Pt is alert, unable to follow commands, talking unintelligibly but conveys meaning with facial expression.  She participated in clinical swallowing evaluation, demonstrating adequate recognition/anticipation of food/liquid approaching mouth.  She masticated purees and orally held thin liquids, then initiated consistent swallow responses.  Initial sips of liquid followed by cough, but as assessment progressed and she settled into the approprate patterns of behavior, she demonstrated improved toleration and no further s/s of aspiration.  Baseline diet/feeding patterns unknown.  Placed phone call to  granddaughter, Florian Buff, but there was no answer.  For today, initiate a pureed/dysphagia 1 diet; allow thin liquids; give meds whole in puree.  Pt will require careful hand-feeding in as upright a position as possible. SLP will follow briefly for safety and family education.  SLP Visit Diagnosis: Dysphagia, unspecified (R13.10)    Aspiration Risk       Diet Recommendation   dysphagia 1, thin liquids  Medication Administration: Crushed with puree    Other  Recommendations Oral Care Recommendations: Oral care BID   Follow up Recommendations None      Frequency and Duration min 1 x/week  1 week       Prognosis Prognosis for Safe Diet Advancement: Guarded      Swallow Study   General HPI: 84 y.o.female,withsevere/advanceddementiaadmitted on 4/16/2019from [place] for left femoral neck fracture.  Underwent ORIF 4/16. Granddaughter has met with Palliative Medicine to establish [place] - at this point plan to is to provide opportunity for therapy.   Type of Study: Bedside Swallow Evaluation Previous Swallow Assessment: Nov 2016 clinical swallow eval with normal findings Diet Prior to this Study: NPO Temperature Spikes Noted: No Respiratory Status: Nasal cannula History of Recent Intubation: No Behavior/Cognition: Alert;Confused;Doesn't follow directions Oral Cavity Assessment: Within Functional Limits Oral Cavity - Dentition: Missing dentition Self-Feeding Abilities: Total assist Patient Positioning: Upright in bed Baseline Vocal Quality: Normal Volitional Cough: Cognitively unable to elicit Volitional Swallow: Unable to elicit    Oral/Motor/Sensory Function Overall Oral Motor/Sensory Function: Other (comment)(symmetry at rest)   Ice Chips Ice chips: Within functional limits Presentation: Spoon   Thin Liquid Thin Liquid: Within functional limits Presentation: Cup    Nectar Thick Nectar Thick Liquid: Not tested   Honey Thick Honey Thick Liquid: Not  tested   Puree Puree: Within functional limits   Solid   GO   Solid: Not tested        Juan Quam Laurice 09/05/2017,10:09 AM  Estill Bamberg L. Tivis Ringer, [place] CCC/SLP Pager [phone]

## 2017-09-05 NOTE — Progress Notes (Signed)
Initial Nutrition Assessment  DOCUMENTATION CODES:   Not applicable  INTERVENTION:    Ensure Enlive po BID, each supplement provides 350 kcal and 20 grams of protein  Magic cup TID with meals, each supplement provides 290 kcal and 9 grams of protein  NUTRITION DIAGNOSIS:   Increased nutrient needs related to post-op healing as evidenced by estimated needs.  GOAL:   Patient will meet greater than or equal to 90% of their needs  MONITOR:   PO intake, Supplement acceptance, Weight trends, Labs, Diet advancement  REASON FOR ASSESSMENT:   Low Braden  ASSESSMENT:   Patient with PMH significant for advanced dementia and OSA. Presents this admission form SNF with left femoral neck fracture.    4/16- left hip replacement  No family at bedside to provide information. SLP saw pt today and recommends dysphagia 1 diet with thin liquids. RN states pt did well with liquids this morning. Will provide pt with supplements for increased needs during her healing process. Records indicate pt weighed 138 lb 07/01/17 and 127 lb this admission (7.9% wt loss in two months, significant for time frame). A limited Nutrition-Focused physical exam was completed. Muscle depletions are to be expected in advanced age. Do not suspect malnutrition at this time. Will try to obtain more history from family if time permits.   Medications reviewed. Labs reviewed: Mg 1.4 (L)   NUTRITION - FOCUSED PHYSICAL EXAM:    Most Recent Value  Orbital Region  No depletion  Upper Arm Region  Unable to assess  Thoracic and Lumbar Region  Unable to assess  Buccal Region  No depletion  Temple Region  Mild depletion  Clavicle Bone Region  Mild depletion  Clavicle and Acromion Bone Region  Mild depletion  Scapular Bone Region  Unable to assess  Dorsal Hand  Unable to assess  Patellar Region  Moderate depletion  Anterior Thigh Region  Moderate depletion  Posterior Calf Region  Moderate depletion  Edema (RD Assessment)   Unable to assess     Diet Order:  DIET - DYS 1 Room service appropriate? Yes; Fluid consistency: Thin  EDUCATION NEEDS:   Not appropriate for education at this time  Skin:  Skin Assessment: Skin Integrity Issues: Skin Integrity Issues:: Incisions Incisions: left hip closed  Last BM:  09/03/17  Height:   Ht Readings from Last 1 Encounters:  09/02/17 5\' 5"  (1.651 m)    Weight:   Wt Readings from Last 1 Encounters:  09/05/17 127 lb 8 oz (57.8 kg)    Ideal Body Weight:  56.8 kg  BMI:  Body mass index is 21.22 kg/m.  Estimated Nutritional Needs:   Kcal:  1500-1700 kcal  Protein:  75-85 g  Fluid:  >1.5 L/day    Vanessa Kickarly Lillyona Polasek RD, LDN Clinical Nutrition Pager # [phone]

## 2017-09-05 NOTE — Progress Notes (Signed)
PROGRESS NOTE    Sherri Leonard  ZOX:[id number] DOB: [birth year] DOA: 09/02/2017 PCP: Sherron Monday, MD   Brief Narrative:  Patient is a 84 y.o.female,withsevere/advanceddementiaadmitted on 4/16/2019from [facility] for left femoral neck fracture.  She underwent open reduction and internal fixation by orthopedics. Currently she is waiting for skilled nursing facility bed.   Assessment & Plan:   Principal Problem:   Closed left hip fracture (HCC) Active Problems:   Dementia without behavioral disturbance   Left hip pain   Acute blood loss as cause of postoperative anemia   Palliative care by specialist   Goals of care, counseling/discussion  Lt Hip Fx-  ORIF on 09/02/17 by Dr Raylene Everts. She will follow up with orthopedics as an outpatient . ASA for DVT  Pain control  AKI: baseline creatinine 0.7, up to 1.2 on admission.  Improving.  Follow  Dementia  AMS: Speech following.  Start on dysphagia 1 diet today.  Symptomatic acute blood loss anemia - due to postop blood loss,hemoglobin down to 6.8 from 9.2.  Pt hadpersistent hypotension and hemodynamic instability requiring IV fluids and transfusion of 2 units of packed cells on 09/03/2017. - h/h stable, follow  Leukocytosis: Stable  Diarrhea: Resolved  Goals of care:  palliative care discussed with family.  Currently she remains full code.  Palliative care will initiate hospice options if she continues to decline.  Hypomagnesemia: Supplemented.  Dysphagia: Speech following.  Started on dysphagia 1 diet    DVT prophylaxis: Aspirin Code Status: Full Family Communication: Talked with granddaughter on phone Disposition Plan: Skilled nursing facility as the bed is available   Consultants: Palliative care, orthopedics  Procedures: Left hip hemi-arthroplasty on 4/16  Antimicrobials: None  Subjective: Patient seen and examined the bedside this morning.  Looked comfortable.   Confused.  Objective: Vitals:   09/04/17 1308 09/04/17 2052 09/05/17 0411 09/05/17 1604  BP: 117/72 114/73 134/76 134/83  Pulse: 90 93 98 100  Resp: 14 14 16 20   Temp: 98.2 F (36.8 C) 97.7 F (36.5 C) 98.6 F (37 C) 98.4 F (36.9 C)  TempSrc: Oral Oral  Oral  SpO2:  100% 100% 100%  Weight:   57.8 kg (127 lb 8 oz)   Height:        Intake/Output Summary (Last 24 hours) at 09/05/2017 1637 Last data filed at 09/05/2017 1605 Gross per 24 hour  Intake 1331.67 ml  Output 700 ml  Net 631.67 ml   Filed Weights   09/02/17 0116 09/04/17 0559 09/05/17 0411  Weight: 65.8 kg (145 lb) 57.2 kg (126 lb 3.2 oz) 57.8 kg (127 lb 8 oz)    Examination:  General exam: Appears calm and comfortable ,Not in distress, chronically ill elderly female HEENT:PERRL,Oral mucosa moist, Ear/Nose normal on gross exam Respiratory system: Bilateral equal air entry, normal vesicular breath sounds, no wheezes or crackles  Cardiovascular system: S1 & S2 heard, RRR. No JVD, murmurs, rubs, gallops or clicks. No pedal edema. Gastrointestinal system: Abdomen is nondistended, soft and nontender. No organomegaly or masses felt. Normal bowel sounds heard. Central nervous system: Not alert or oriented. confused  Extremities: Brace on the left lower extremity, clean surgical incision on the left hip. Skin: No rashes, lesions or ulcers,no icterus ,no pallor MSK: Normal muscle bulk,tone ,power Psychiatry: Judgement and insight appear impaired   Data Reviewed: I have personally reviewed following labs and imaging studies  CBC: Recent Labs  Lab 09/02/17 0204 09/03/17 0341 09/03/17 2028 09/04/17 0520 09/05/17 0536  WBC  14.7* 14.8*  --  15.0* 11.3*  NEUTROABS 12.0*  --   --   --   --   HGB 9.2* 6.8* 10.0* 11.0* 9.5*  HCT 29.0* 21.7* 30.4* 33.8* 29.4*  MCV 94.8 93.9  --  92.1 93.0  PLT 341 310  --  248 273   Basic Metabolic Panel: Recent Labs  Lab 09/02/17 0204 09/03/17 0341 09/04/17 0520 09/05/17 0536    NA 144 143 145 142  K 4.2 4.4 3.9 3.7  CL 110 110 112* 109  CO2 24 24 24 24   GLUCOSE 169* 229* 138* 104*  BUN 37* 32* 26* 20  CREATININE 1.20* 1.00 0.80 0.63  CALCIUM 8.8* 7.8* 8.5* 8.2*  MG  --   --   --  1.4*   GFR: Estimated Creatinine Clearance: 38.7 mL/min (by C-G formula based on SCr of 0.63 mg/dL). Liver Function Tests: Recent Labs  Lab 09/03/17 0341  AST 36  ALT 20  ALKPHOS 49  BILITOT 0.5  PROT 5.7*  ALBUMIN 2.2*   No results for input(s): LIPASE, AMYLASE in the last 168 hours. No results for input(s): AMMONIA in the last 168 hours. Coagulation Profile: Recent Labs  Lab 09/02/17 0204  INR 1.14   Cardiac Enzymes: Recent Labs  Lab 09/02/17 0510  TROPONINI 0.04*   BNP (last 3 results) No results for input(s): PROBNP in the last 8760 hours. HbA1C: No results for input(s): HGBA1C in the last 72 hours. CBG: Recent Labs  Lab 09/03/17 0746 09/03/17 1334 09/03/17 1844 09/04/17 0650  GLUCAP 167* 153* 138* 135*   Lipid Profile: No results for input(s): CHOL, HDL, LDLCALC, TRIG, CHOLHDL, LDLDIRECT in the last 72 hours. Thyroid Function Tests: No results for input(s): TSH, T4TOTAL, FREET4, T3FREE, THYROIDAB in the last 72 hours. Anemia Panel: No results for input(s): VITAMINB12, FOLATE, FERRITIN, TIBC, IRON, RETICCTPCT in the last 72 hours. Sepsis Labs: Recent Labs  Lab 09/03/17 0341  LATICACIDVEN 1.2    Recent Results (from the past 240 hour(s))  MRSA PCR Screening     Status: None   Collection Time: 09/02/17  5:17 PM  Result Value Ref Range Status   MRSA by PCR NEGATIVE NEGATIVE Final    Comment:        The GeneXpert MRSA Assay (FDA approved for NASAL specimens only), is one component of a comprehensive MRSA colonization surveillance program. It is not intended to diagnose MRSA infection nor to guide or monitor treatment for MRSA infections. Performed at [facility], [address] [place], [place] [address]           Radiology Studies: No results found.      Scheduled Meds: . chlorhexidine  60 mL Topical Once  . povidone-iodine  2 application Topical Once  . sodium chloride flush  3 mL Intravenous Q12H   Continuous Infusions: . sodium chloride    . dextrose 5 % and 0.45% NaCl 50 mL/hr at 09/05/17 1409  . sodium chloride       LOS: 3 days    Time spent: More than 50% of that time was spent in counseling and/or coordination of care.      Burnadette PopAmrit Thatcher Doberstein, MD [facility] Pager [phone]  If 7PM-7AM, please contact night-coverage [email or web address] Password [email or web address] 09/05/2017, 4:37 PM

## 2017-09-05 NOTE — Evaluation (Addendum)
Physical Therapy Evaluation Patient Details Name: Sherri Leonard MRN: [id number] DOB: 1920-09-11 Today's Date: 09/05/2017   History of Present Illness  84 yo female admitted from SNF for pain. Found to have L femoral neck fx. S/P L hip hemi 09/02/17. Hx of dementia, brain surgery, pacemaker  Clinical Impression   On eval, pt required Total assist +2 for mobility. She was able to sit at EOB unsupported once positioned there. She followed 1 step commands inconsistently with repeated multimodal cueing. No family present during session. Based on pt's cognition, presence of contractures, and prior level of functional mobility, feel pt's rehab potential is poor. Feel pt will continue to require total assist for mobility. Recommend return to SNF. Will defer decision to resume any further attempts at therapy to SNF therapy staff.   Addendum: Spoke with pt's granddaughter Sherri Leonard). Attempted to convey that pt's rehab potential is poor given pt's  PLOF and her cognition (dementia). Recommended she have further discussion with MD and/or surgeon and the CSW as well.    Follow Up Recommendations Recommend return to SNF. Will let SNF therapy staff determine if they wish to resume PT trial with pt.     Equipment Recommendations  (TBD at next venue)    Recommendations for Other Services       Precautions / Restrictions Precautions Precautions: Fall;Posterior Hip Required Braces or Orthoses: Knee Immobilizer - Left Restrictions Other Position/Activity Restrictions: WBAT      Mobility  Bed Mobility Overal bed mobility: Needs Assistance Bed Mobility: Supine to Sit;Sit to Supine;Rolling Rolling: Total assist;+2 for physical assistance;+2 for safety/equipment   Supine to sit: Total assist;+2 for physical assistance;+2 for safety/equipment;HOB elevated Sit to supine: Total assist;+2 for physical assistance;+2 for safety/equipment;HOB elevated   General bed mobility comments: When asked to roll, pt  would initiate but she is unable. Assist for trunk and bil LEs to get to EOB. Utilized bedpad to aid with scooting, positioning. Once EOB, pt was able to sit unsupported with Min guard assist.   Transfers                 General transfer comment: NT-pt unable  Ambulation/Gait                Stairs            Wheelchair Mobility    Modified Rankin (Stroke Patients Only)       Balance Overall balance assessment: Needs assistance Sitting-balance support: No upper extremity supported;Feet supported Sitting balance-Leahy Scale: Fair Sitting balance - Comments: Pt was able to sit unsupported for at least 10 minutes. She performed 5 LAQ reps on R side. She performed shoulder flexion and elbow flex/ext with AA.                                      Pertinent Vitals/Pain Pain Assessment: Faces Faces Pain Scale: Hurts even more Pain Location: pt unable to verbalize. Appears to be most vocal with movement of L LE.  Pain Descriptors / Indicators: Moaning;Grimacing Pain Intervention(s): Repositioned;Limited activity within patient's tolerance    Home Living Family/patient expects to be discharged to:: Skilled nursing facility                      Prior Function Level of Independence: Needs assistance         Comments: pt unable to provide history, no family present, per notes, admitted  after fall out of w/c     Hand Dominance        Extremity/Trunk Assessment   Upper Extremity Assessment Upper Extremity Assessment: Generalized weakness(flexion contractures at both elbows, R worse than L. Pt is able to move UEs when cued; strength ~3-/5. Shoulder ROM~45 degrees.)    Lower Extremity Assessment Lower Extremity Assessment: (Unable to abduct hips due to tightness) RLE Deficits / Details: pt was able to extend R knee against gravity.  LLE Deficits / Details: KI in place. Internal rotation noted.  LLE: Unable to fully assess due to  pain;Unable to fully assess due to immobilization    Cervical / Trunk Assessment Cervical / Trunk Assessment: Kyphotic Cervical / Trunk Exceptions: forward flexion. preference for right lateral lean  Communication   Communication: Receptive difficulties;Expressive difficulties  Cognition Arousal/Alertness: Awake/alert Behavior During Therapy: WFL for tasks assessed/performed Overall Cognitive Status: History of cognitive impairments - at baseline                                        General Comments      Exercises     Assessment/Plan    PT Assessment All further PT needs can be met in the next venue of care(SNF)  PT Problem List Decreased mobility;Pain;Decreased cognition;Decreased balance;Decreased strength;Decreased range of motion;Decreased activity tolerance;Decreased knowledge of use of DME;Decreased safety awareness;Decreased knowledge of precautions       PT Treatment Interventions      PT Goals (Current goals can be found in the Care Plan section)  Acute Rehab PT Goals Patient Stated Goal: per granddaughter, she wishes for pt to work with PT. PT Goal Formulation: All assessment and education complete, DC therapy    Frequency     Barriers to discharge        Co-evaluation               AM-PAC PT "6 Clicks" Daily Activity  Outcome Measure Difficulty turning over in bed (including adjusting bedclothes, sheets and blankets)?: Unable Difficulty moving from lying on back to sitting on the side of the bed? : Unable Difficulty sitting down on and standing up from a chair with arms (e.g., wheelchair, bedside commode, etc,.)?: Unable Help needed moving to and from a bed to chair (including a wheelchair)?: Total Help needed walking in hospital room?: Total Help needed climbing 3-5 steps with a railing? : Total 6 Click Score: 6    End of Session Equipment Utilized During Treatment: Left knee immobilizer Activity Tolerance: Patient limited by  pain(Limited by cognition) Patient left: in bed;with call bell/phone within reach;with bed alarm set   PT Visit Diagnosis: Other abnormalities of gait and mobility (R26.89);Muscle weakness (generalized) (M62.81);Pain Pain - Right/Left: Left Pain - part of body: Hip    Time: 0737-1062 PT Time Calculation (min) (ACUTE ONLY): 30 min   Charges:   PT Evaluation $PT Eval Moderate Complexity: 1 Mod PT Treatments $Therapeutic Activity: 8-22 mins   PT G Codes:          Weston Anna, MPT Pager: [phone]

## 2017-09-06 LAB — BPAM RBC
BLOOD PRODUCT EXPIRATION DATE: 201905152359
BLOOD PRODUCT EXPIRATION DATE: 201905152359
Blood Product Expiration Date: 201905152359
ISSUE DATE / TIME: 201904170600
ISSUE DATE / TIME: 201904171039
UNIT TYPE AND RH: 5100
Unit Type and Rh: 5100
Unit Type and Rh: 5100

## 2017-09-06 LAB — TYPE AND SCREEN
ABO/RH(D): O POS
Antibody Screen: NEGATIVE
UNIT DIVISION: 0
Unit division: 0
Unit division: 0

## 2017-09-06 LAB — CBC
HEMATOCRIT: 32.1 % — AB (ref 36.0–46.0)
HEMOGLOBIN: 10.3 g/dL — AB (ref 12.0–15.0)
MCH: 29.9 pg (ref 26.0–34.0)
MCHC: 32.1 g/dL (ref 30.0–36.0)
MCV: 93 fL (ref 78.0–100.0)
Platelets: 332 10*3/uL (ref 150–400)
RBC: 3.45 MIL/uL — AB (ref 3.87–5.11)
RDW: 15.1 % (ref 11.5–15.5)
WBC: 9.9 10*3/uL (ref 4.0–10.5)

## 2017-09-06 LAB — MAGNESIUM: Magnesium: 1.9 mg/dL (ref 1.7–2.4)

## 2017-09-06 NOTE — Progress Notes (Signed)
CSW contacted patient's granddaughter [name](Armina Swittenburg [phone]) to discuss patient's discharge plans, patient's granddaughter reported that she will contact CSW after funeral.  CSW awaiting return call to discuss discharge planning.  Celso SickleKimberly Sruthi Maurer, [place] Clinical Social Worker [facility] Cell#: [phone]

## 2017-09-06 NOTE — Progress Notes (Signed)
PROGRESS NOTE    Sherri Leonard  ZOX:[id number] DOB: 05/02/1921 DOA: 09/02/2017 PCP: [name], S Ahmed, MD   Brief Narrative:  Patient is a 84 y.o.female,withsevere/advanceddementiaadmitted on 4/16/2019from [facility] for left femoral neck fracture.  She underwent open reduction and internal fixation by orthopedics. Currently she is waiting for skilled nursing facility bed.   Assessment & Plan:   Principal Problem:   Closed left hip fracture (HCC) Active Problems:   Dementia without behavioral disturbance   Left hip pain   Acute blood loss as cause of postoperative anemia   Palliative care by specialist   Goals of care, counseling/discussion  Lt Hip Fx-  ORIF on 09/02/17 by Dr Raylene EvertsMathew Olin. She will follow up with orthopedics as an outpatient . ASA for DVT  Pain control  AKI: baseline creatinine 0.7, up to 1.2 on admission.  Improved  Dementia  AMS: Speech following.  Started on dysphagia 1 diet .  Symptomatic acute blood loss anemia - due to postop blood loss,hemoglobin down to 6.8 from 9.2.  Pt hadpersistent hypotension and hemodynamic instability requiring IV fluids and transfusion of 2 units of packed cells on 09/03/2017. - h/h stable, follow  Leukocytosis: Stable  Diarrhea: Resolved  Goals of care:  palliative care discussed with family.  Currently she remains full code.  Palliative care will initiate hospice options if she continues to decline.  Hypomagnesemia: Supplemented.  Dysphagia: Speech following.  Started on dysphagia 1 diet  Patient is medically stable to be discharged to skilled nursing facility.  DVT prophylaxis: Aspirin Code Status: Full Family Communication: Talked with granddaughter on phone on 09/05/17 Disposition Plan: Skilled nursing facility as soon as the bed is available   Consultants: Palliative care, orthopedics  Procedures: Left hip hemi-arthroplasty on 4/16  Antimicrobials:  None  Subjective: Patient seen and examined the pressure this morning.  Looked very comfortable today.  Objective: Vitals:   09/05/17 1604 09/05/17 2209 09/06/17 0637 09/06/17 1430  BP: 134/83 (!) 142/74 (!) 149/87 117/73  Pulse: 100 97 90 96  Resp: 20 20 20 16   Temp: 98.4 F (36.9 C) 98.4 F (36.9 C) (!) 97.5 F (36.4 C) 99.6 F (37.6 C)  TempSrc: Oral  Oral Axillary  SpO2: 100% 100% 100% 98%  Weight:      Height:        Intake/Output Summary (Last 24 hours) at 09/06/2017 1437 Last data filed at 09/06/2017 1200 Gross per 24 hour  Intake 2033.33 ml  Output 725 ml  Net 1308.33 ml   Filed Weights   09/02/17 0116 09/04/17 0559 09/05/17 0411  Weight: 65.8 kg (145 lb) 57.2 kg (126 lb 3.2 oz) 57.8 kg (127 lb 8 oz)    Examination:  General exam: Appears calm and comfortable ,Not in distress, chronically ill elderly female HEENT:PERRL,Oral mucosa moist, Ear/Nose normal on gross exam Respiratory system: Bilateral equal air entry, normal vesicular breath sounds, no wheezes or crackles  Cardiovascular system: S1 & S2 heard, RRR. No JVD, murmurs, rubs, gallops or clicks. No pedal edema. Gastrointestinal system: Abdomen is nondistended, soft and nontender. No organomegaly or masses felt. Normal bowel sounds heard. Central nervous system: Alert but not  oriented. confused  Extremities: Brace on the left lower extremity, clean surgical incision on the left hip. Skin: No rashes, lesions or ulcers,no icterus ,no pallor MSK: Normal muscle bulk,tone ,power Psychiatry: Judgement and insight appear impaired   Data Reviewed: I have personally reviewed following labs and imaging studies  CBC: Recent Labs  Lab 09/02/17  1610 09/03/17 0341 09/03/17 2028 09/04/17 0520 09/05/17 0536 09/06/17 0521  WBC 14.7* 14.8*  --  15.0* 11.3* 9.9  NEUTROABS 12.0*  --   --   --   --   --   HGB 9.2* 6.8* 10.0* 11.0* 9.5* 10.3*  HCT 29.0* 21.7* 30.4* 33.8* 29.4* 32.1*  MCV 94.8 93.9  --  92.1 93.0  93.0  PLT 341 310  --  248 273 332   Basic Metabolic Panel: Recent Labs  Lab 09/02/17 0204 09/03/17 0341 09/04/17 0520 09/05/17 0536 09/06/17 0521  NA 144 143 145 142  --   K 4.2 4.4 3.9 3.7  --   CL 110 110 112* 109  --   CO2 24 24 24 24   --   GLUCOSE 169* 229* 138* 104*  --   BUN 37* 32* 26* 20  --   CREATININE 1.20* 1.00 0.80 0.63  --   CALCIUM 8.8* 7.8* 8.5* 8.2*  --   MG  --   --   --  1.4* 1.9   GFR: Estimated Creatinine Clearance: 38.7 mL/min (by C-G formula based on SCr of 0.63 mg/dL). Liver Function Tests: Recent Labs  Lab 09/03/17 0341  AST 36  ALT 20  ALKPHOS 49  BILITOT 0.5  PROT 5.7*  ALBUMIN 2.2*   No results for input(s): LIPASE, AMYLASE in the last 168 hours. No results for input(s): AMMONIA in the last 168 hours. Coagulation Profile: Recent Labs  Lab 09/02/17 0204  INR 1.14   Cardiac Enzymes: Recent Labs  Lab 09/02/17 0510  TROPONINI 0.04*   BNP (last 3 results) No results for input(s): PROBNP in the last 8760 hours. HbA1C: No results for input(s): HGBA1C in the last 72 hours. CBG: Recent Labs  Lab 09/03/17 0746 09/03/17 1334 09/03/17 1844 09/04/17 0650  GLUCAP 167* 153* 138* 135*   Lipid Profile: No results for input(s): CHOL, HDL, LDLCALC, TRIG, CHOLHDL, LDLDIRECT in the last 72 hours. Thyroid Function Tests: No results for input(s): TSH, T4TOTAL, FREET4, T3FREE, THYROIDAB in the last 72 hours. Anemia Panel: No results for input(s): VITAMINB12, FOLATE, FERRITIN, TIBC, IRON, RETICCTPCT in the last 72 hours. Sepsis Labs: Recent Labs  Lab 09/03/17 0341  LATICACIDVEN 1.2    Recent Results (from the past 240 hour(s))  MRSA PCR Screening     Status: None   Collection Time: 09/02/17  5:17 PM  Result Value Ref Range Status   MRSA by PCR NEGATIVE NEGATIVE Final    Comment:        The GeneXpert MRSA Assay (FDA approved for NASAL specimens only), is one component of a comprehensive MRSA colonization surveillance program. It  is not intended to diagnose MRSA infection nor to guide or monitor treatment for MRSA infections. Performed at [facility], [address] [place], [place] [address]          Radiology Studies: No results found.      Scheduled Meds: . aspirin  325 mg Oral Daily  . chlorhexidine  60 mL Topical Once  . povidone-iodine  2 application Topical Once  . sodium chloride flush  3 mL Intravenous Q12H   Continuous Infusions: . sodium chloride    . dextrose 5 % and 0.45% NaCl Stopped (09/06/17 1152)  . sodium chloride       LOS: 4 days    Time spent: 25 mins    Burnadette Pop, MD [facility] Pager [phone]  If 7PM-7AM, please contact night-coverage [email or web address] Password [facility] 09/06/2017, 2:37  PM  

## 2017-09-06 NOTE — Plan of Care (Signed)

## 2017-09-06 NOTE — Progress Notes (Signed)
   Subjective:  Patient reports pain as mild.  Patient is pleasantly demented.  No overnight complaints.  Objective:   VITALS:   Vitals:   09/05/17 0411 09/05/17 1604 09/05/17 2209 09/06/17 0637  BP: 134/76 134/83 (!) 142/74 (!) 149/87  Pulse: 98 100 97 90  Resp: 16 20 20 20   Temp: 98.6 F (37 C) 98.4 F (36.9 C) 98.4 F (36.9 C) (!) 97.5 F (36.4 C)  TempSrc:  Oral  Oral  SpO2: 100% 100% 100% 100%  Weight: 57.8 kg (127 lb 8 oz)     Height:        Neurologically intact Sensation intact distally Intact pulses distally Dorsiflexion/Plantar flexion intact Incision: dressing C/D/I   Lab Results  Component Value Date   WBC 9.9 09/06/2017   HGB 10.3 (L) 09/06/2017   HCT 32.1 (L) 09/06/2017   MCV 93.0 09/06/2017   PLT 332 09/06/2017   BMET    Component Value Date/Time   NA 142 09/05/2017 0536   NA 140 03/05/2016   K 3.7 09/05/2017 0536   CL 109 09/05/2017 0536   CO2 24 09/05/2017 0536   GLUCOSE 104 (H) 09/05/2017 0536   BUN 20 09/05/2017 0536   BUN 26 (A) 03/05/2016   CREATININE 0.63 09/05/2017 0536   CALCIUM 8.2 (L) 09/05/2017 0536   GFRNONAA >60 09/05/2017 0536   GFRAA >60 09/05/2017 0536     Assessment/Plan: 4 Days Post-Op   Principal Problem:   Closed left hip fracture (HCC) Active Problems:   Dementia without behavioral disturbance   Left hip pain   Acute blood loss as cause of postoperative anemia   Palliative care by specialist   Goals of care, counseling/discussion   Up with therapy Weight-bear as tolerated left lower extremity Posterior hip precautions left lower extremity Follow-up with Dr. Charlann Boxerlin 2 weeks following discharge.   Yolonda KidaJason Patrick Rogers 09/06/2017, 9:35 AM   Maryan RuedJason P Rogers, MD [phone]

## 2017-09-06 NOTE — Plan of Care (Signed)

## 2017-09-06 NOTE — Progress Notes (Signed)
CSW contacted by patient's granddaughter Public affairs consultant(Armina Swittenburg). Patient's granddaughter reported that she spoke with patient's attending MD and she wants to speak with [facility] about their ability to meet patient's care needs. Patient's granddaughter reports that she wants to speak with SNF before deciding to send patient back. CSW agreed to notify SNF of patient's granddaughter's request.  CSW spoke with [facility] staff member Wallie CharShaquenia and notified her of patient's granddaughter's request. Staff agreed to follow up.  CSW will continue to follow and assist with discharge planning.  Celso SickleKimberly Ekin Pilar, [place] Clinical Social Worker [facility] Cell#: [phone]

## 2017-09-06 NOTE — Progress Notes (Signed)
CSW informed by patient's attending MD that patient is ready for dc.  CSW contacted patient's granddaughter Sherri Leonard(Armina Swittenburg [phone]) to discuss patient's discharge plans, no answer. CSW left voicemail requesting return phone call.  Celso SickleKimberly Lupe Handley, [place]CS[place] Clinical Social Worker [facility] Cell#: [phone]

## 2017-09-06 NOTE — Progress Notes (Signed)
Assumed care of patient at 1500. Agree with previous Nurse assessment.  Demichael Traum M. Dashanae Longfield, RN   

## 2017-09-07 NOTE — NC FL2 (Signed)
[place] MEDICAID FL2 LEVEL OF CARE SCREENING TOOL     IDENTIFICATION  Patient Name: Sherri Leonard Birthdate: March 03, 1921 Sex: female Admission Date (Current Location): 09/02/2017  [facility] and [place] Number:  Producer, television/film/video and Address:  [facility],  [address] [place] [address]      Provider Number: [id number]  Attending Physician Name and Address:  Burnadette Pop, MD  Relative Name and Phone Number:  Ranee Gosselin, daughter,    Current Level of Care: SNF Recommended Level of Care: Skilled Nursing Facility Prior Approval Number:    Date Approved/Denied:   PASRR Number: pending  Discharge Plan: SNF    Current Diagnoses: Patient Active Problem List   Diagnosis Date Noted  . Palliative care by specialist   . Goals of care, counseling/discussion   . Acute blood loss as cause of postoperative anemia 09/03/2017  . Closed left hip fracture (HCC) 09/02/2017  . Left hip pain 09/02/2017  . Osteoarthritis 08/28/2016  . Presence of permanent cardiac pacemaker 05/27/2016  . Essential hypertension, benign 09/07/2015  . Arthritis 05/03/2015  . Dementia without behavioral disturbance 04/10/2015  . Parkinsonian features 04/10/2015  . Depression due to dementia 04/10/2015  . Anemia 04/06/2015  . Hyperglycemia 04/06/2015  . Sick sinus syndrome (HCC) 05/06/2013    Orientation RESPIRATION BLADDER Height & Weight     (Disoriented x4, responds to voice)  Normal Incontinent Weight: 127 lb 8 oz (57.8 kg) Height:  5\' 5"  (165.1 cm)  BEHAVIORAL SYMPTOMS/MOOD NEUROLOGICAL BOWEL NUTRITION STATUS      Continent Diet(See DC Summary)  AMBULATORY STATUS COMMUNICATION OF NEEDS Skin   Total Care Verbally Surgical wounds(Left Total Hip)                       Personal Care Assistance Level of Assistance  Bathing, Feeding, Dressing Bathing Assistance: Maximum assistance Feeding assistance: Maximum assistance Dressing Assistance: Maximum  assistance     Functional Limitations Info  Sight, Hearing, Speech Sight Info: Adequate Hearing Info: Adequate Speech Info: Adequate    SPECIAL CARE FACTORS FREQUENCY        PT Frequency: n/a OT Frequency: n/a            Contractures      Additional Factors Info  Code Status, Allergies Code Status Info: Full Code Allergies Info: ATIVAN LORAZEPAM, BENZODIAZEPINES            Current Medications (09/07/2017):  This is the current hospital active medication list Current Facility-Administered Medications  Medication Dose Route Frequency Provider Last Rate Last Dose  . 0.9 %  sodium chloride infusion  250 mL Intravenous PRN Lanney Gins, PA-C      . acetaminophen (TYLENOL) tablet 650 mg  650 mg Oral Q6H PRN Lanney Gins, PA-C   650 mg at 09/07/17 1429   Or  . acetaminophen (TYLENOL) suppository 650 mg  650 mg Rectal Q6H PRN Lanney Gins, PA-C   650 mg at 09/03/17 9562  . albuterol (PROVENTIL) (2.5 MG/3ML) 0.083% nebulizer solution 2.5 mg  2.5 mg Nebulization Q2H PRN Babish, Matthew, PA-C      . alum & mag hydroxide-simeth (MAALOX/MYLANTA) 200-200-20 MG/5ML suspension 30 mL  30 mL Oral Q4H PRN Babish, Matthew, PA-C      . aspirin tablet 325 mg  325 mg Oral Daily Adhikari, Amrit, MD   325 mg at 09/06/17 1019  . baclofen (LIORESAL) tablet 5 mg  5 mg Oral PRN Lanney Gins, PA-C      .  chlorhexidine (HIBICLENS) 4 % liquid 4 application  60 mL Topical Once Babish, Matthew, PA-C      . dextrose 5 %-0.45 % sodium chloride infusion   Intravenous Continuous Mariea ClontsEmokpae, Courage, MD 50 mL/hr at 09/06/17 2349    . menthol-cetylpyridinium (CEPACOL) lozenge 3 mg  1 lozenge Oral PRN Lanney GinsBabish, Matthew, PA-C       Or  . phenol (CHLORASEPTIC) mouth spray 1 spray  1 spray Mouth/Throat PRN Babish, Matthew, PA-C      . metoCLOPramide (REGLAN) tablet 5-10 mg  5-10 mg Oral Q8H PRN Lanney GinsBabish, Matthew, PA-C       Or  . metoCLOPramide (REGLAN) injection 5-10 mg  5-10 mg Intravenous Q8H PRN Babish,  Matthew, PA-C      . ondansetron (ZOFRAN) tablet 4 mg  4 mg Oral Q6H PRN Lanney GinsBabish, Matthew, PA-C       Or  . ondansetron (ZOFRAN) injection 4 mg  4 mg Intravenous Q6H PRN Babish, Matthew, PA-C      . povidone-iodine 10 % swab 2 application  2 application Topical Once Babish, Matthew, PA-C      . sodium chloride 0.9 % bolus 250 mL  250 mL Intravenous Once Emokpae, Courage, MD      . sodium chloride flush (NS) 0.9 % injection 3 mL  3 mL Intravenous Q12H Lanney GinsBabish, Matthew, PA-C   3 mL at 09/06/17 2259  . sodium chloride flush (NS) 0.9 % injection 3 mL  3 mL Intravenous PRN Lanney GinsBabish, Matthew, PA-C         Discharge Medications: Please see discharge summary for a list of discharge medications.  Relevant Imaging Results:  Relevant Lab Results:   Additional Information SS#: [id number]  Sherri Crosland V Lebert Lovern, LCSW

## 2017-09-07 NOTE — Progress Notes (Signed)
PROGRESS NOTE    Sherri Leonard  ZOX:[id number] DOB: 09/08/1920 DOA: 09/02/2017 PCP: [name], S Ahmed, MD   Brief Narrative:  Patient is a 84 y.o.female,withsevere/advanceddementiaadmitted on 4[facility]16/2019[facility].  She underwent open reduction and internal fixation by orthopedics. Currently she is waiting for another skilled nursing facility bed as the granddaughter does not want her to be discharged over the previous one.   Assessment & Plan:   Principal Problem:   Closed left hip fracture (HCC) Active Problems:   Dementia without behavioral disturbance   Left hip pain   Acute blood loss as cause of postoperative anemia   Palliative care by specialist   Goals of care, counseling/discussion  Lt Hip Fx-  ORIF on 09/02/17 by Dr Raylene EvertsMathew Olin. She will follow up with orthopedics as an outpatient . ASA for DVT  Pain control  AKI: baseline creatinine 0.7, up to 1.2 on admission.  Improved  Dementia  AMS:  Altered mental status on baseline.  Speech slurred and unrecognizable.  Continue supportive care.  Symptomatic acute blood loss anemia - Due to postop blood loss,hemoglobin down to 6.8 from 9.2.  Pt hadpersistent hypotension and hemodynamic instability requiring IV fluids and transfusion of 2 units of packed cells on 09/03/2017. H and H  Stable now.  Leukocytosis: Stable  Diarrhea: Resolved  Goals of care:  Palliative care discussed with family.  Currently she remains full code.  Palliative care will initiate hospice options if she continues to decline incase. But she is comfortable at present.  Hypomagnesemia: Supplemented.  Dysphagia: Speech following.  Started on dysphagia 1 diet  Patient is medically stable to be discharged to skilled nursing facility as soon as the bed is available.  DVT prophylaxis: Aspirin Code Status: Full Family Communication: Talked with granddaughter on phone  today. Disposition Plan: Skilled nursing facility as soon as the bed is available.  Granddaughter is upset with the previous skilled nursing facility staff because they could not figure out why  broke her hip.The staff over there deny that she fell .  Social worker looking for new facility.   Consultants: Palliative care, orthopedics  Procedures: Left hip hemi-arthroplasty on 4/16  Antimicrobials: None  Subjective: Patient seen and examined the bedside this morning.  Looked comfortable.  On her baseline.  Objective: Vitals:   09/06/17 1430 09/06/17 2023 09/07/17 0538 09/07/17 1452  BP: 117/73 113/71 (!) 151/78 (!) 142/79  Pulse: 96 (!) 105 100 96  Resp: 16 20 20  (!) 22  Temp: 99.6 F (37.6 C) (!) 100.9 F (38.3 C) 98 F (36.7 C) 98.8 F (37.1 C)  TempSrc: Axillary Oral Oral Oral  SpO2: 98% 92% 100%   Weight:      Height:        Intake/Output Summary (Last 24 hours) at 09/07/2017 1541 Last data filed at 09/07/2017 1452 Gross per 24 hour  Intake 562.84 ml  Output 275 ml  Net 287.84 ml   Filed Weights   09/02/17 0116 09/04/17 0559 09/05/17 0411  Weight: 65.8 kg (145 lb) 57.2 kg (126 lb 3.2 oz) 57.8 kg (127 lb 8 oz)    Examination:  General exam: Appears calm and comfortable ,Not in distress, demented elderly female HEENT:PERRL,Oral mucosa moist, Ear/Nose normal on gross exam Respiratory system: Bilateral equal air entry, normal vesicular breath sounds, no wheezes or crackles  Cardiovascular system: S1 & S2 heard, RRR. No JVD, murmurs, rubs, gallops or clicks. No pedal edema. Gastrointestinal system: Abdomen is nondistended,  soft and nontender. No organomegaly or masses felt. Normal bowel sounds heard. Central nervous system: Alert but not  oriented. confused  Extremities: Brace on the left lower extremity, clean surgical incision on the left hip. Skin: No rashes, lesions or ulcers,no icterus ,no pallor Psychiatry: Judgement and insight appear impaired   Data  Reviewed: I have personally reviewed following labs and imaging studies  CBC: Recent Labs  Lab 09/02/17 0204 09/03/17 0341 09/03/17 2028 09/04/17 0520 09/05/17 0536 09/06/17 0521  WBC 14.7* 14.8*  --  15.0* 11.3* 9.9  NEUTROABS 12.0*  --   --   --   --   --   HGB 9.2* 6.8* 10.0* 11.0* 9.5* 10.3*  HCT 29.0* 21.7* 30.4* 33.8* 29.4* 32.1*  MCV 94.8 93.9  --  92.1 93.0 93.0  PLT 341 310  --  248 273 332   Basic Metabolic Panel: Recent Labs  Lab 09/02/17 0204 09/03/17 0341 09/04/17 0520 09/05/17 0536 09/06/17 0521  NA 144 143 145 142  --   K 4.2 4.4 3.9 3.7  --   CL 110 110 112* 109  --   CO2 24 24 24 24   --   GLUCOSE 169* 229* 138* 104*  --   BUN 37* 32* 26* 20  --   CREATININE 1.20* 1.00 0.80 0.63  --   CALCIUM 8.8* 7.8* 8.5* 8.2*  --   MG  --   --   --  1.4* 1.9   GFR: Estimated Creatinine Clearance: 38.7 mL/min (by C-G formula based on SCr of 0.63 mg/dL). Liver Function Tests: Recent Labs  Lab 09/03/17 0341  AST 36  ALT 20  ALKPHOS 49  BILITOT 0.5  PROT 5.7*  ALBUMIN 2.2*   No results for input(s): LIPASE, AMYLASE in the last 168 hours. No results for input(s): AMMONIA in the last 168 hours. Coagulation Profile: Recent Labs  Lab 09/02/17 0204  INR 1.14   Cardiac Enzymes: Recent Labs  Lab 09/02/17 0510  TROPONINI 0.04*   BNP (last 3 results) No results for input(s): PROBNP in the last 8760 hours. HbA1C: No results for input(s): HGBA1C in the last 72 hours. CBG: Recent Labs  Lab 09/03/17 0746 09/03/17 1334 09/03/17 1844 09/04/17 0650  GLUCAP 167* 153* 138* 135*   Lipid Profile: No results for input(s): CHOL, HDL, LDLCALC, TRIG, CHOLHDL, LDLDIRECT in the last 72 hours. Thyroid Function Tests: No results for input(s): TSH, T4TOTAL, FREET4, T3FREE, THYROIDAB in the last 72 hours. Anemia Panel: No results for input(s): VITAMINB12, FOLATE, FERRITIN, TIBC, IRON, RETICCTPCT in the last 72 hours. Sepsis Labs: Recent Labs  Lab 09/03/17 0341   LATICACIDVEN 1.2    Recent Results (from the past 240 hour(s))  MRSA PCR Screening     Status: None   Collection Time: 09/02/17  5:17 PM  Result Value Ref Range Status   MRSA by PCR NEGATIVE NEGATIVE Final    Comment:        The GeneXpert MRSA Assay (FDA approved for NASAL specimens only), is one component of a comprehensive MRSA colonization surveillance program. It is not intended to diagnose MRSA infection nor to guide or monitor treatment for MRSA infections. Performed at [facility], [address], [place], [place] [address]          Radiology Studies: No results found.      Scheduled Meds: . aspirin  325 mg Oral Daily  . chlorhexidine  60 mL Topical Once  . povidone-iodine  2 application Topical Once  .  sodium chloride flush  3 mL Intravenous Q12H   Continuous Infusions: . sodium chloride    . dextrose 5 % and 0.45% NaCl 50 mL/hr at 09/06/17 2349  . sodium chloride       LOS: 5 days    Time spent: 25 mins    Burnadette Pop, MD [facility] Pager [phone]  If 7PM-7AM, please contact night-coverage [email or web address] Password [facility] 09/07/2017, 3:41 PM

## 2017-09-07 NOTE — Social Work (Signed)
CSW discussed case at length with granddaughter. [facility] and desires for patient to go to another SNF. CSW validated her concerns and answered questions. CSW discussed barriers for SNF placement as patient will need long term bed as patient is a [facility]. Granddaughter agreed and voiced that she has been through this process before. CSW advised that SNF wants to call her to discuss case. Granddaughter agreed to same. CSW obtained permission to send out to SNF's.  CSW will continue to follow up as pt will need new SNF offers and the desired SNF will need to obtain Insurance Auth and that cannot occur over the weekend.   Keene BreathPatricia Chyan Carnero, LCSW Clinical Social Worker-Weekend [facility] [phone]

## 2017-09-08 DIAGNOSIS — S72002A Fracture of unspecified part of neck of left femur, initial encounter for closed fracture: Principal | ICD-10-CM

## 2017-09-08 DIAGNOSIS — D62 Acute posthemorrhagic anemia: Secondary | ICD-10-CM

## 2017-09-08 LAB — CBC WITH DIFFERENTIAL/PLATELET
BASOS ABS: 0 10*3/uL (ref 0.0–0.1)
Basophils Relative: 0 %
EOS ABS: 0.3 10*3/uL (ref 0.0–0.7)
EOS PCT: 3 %
HCT: 28.1 % — ABNORMAL LOW (ref 36.0–46.0)
Hemoglobin: 9.2 g/dL — ABNORMAL LOW (ref 12.0–15.0)
Lymphocytes Relative: 20 %
Lymphs Abs: 1.9 10*3/uL (ref 0.7–4.0)
MCH: 30.7 pg (ref 26.0–34.0)
MCHC: 32.7 g/dL (ref 30.0–36.0)
MCV: 93.7 fL (ref 78.0–100.0)
Monocytes Absolute: 0.7 10*3/uL (ref 0.1–1.0)
Monocytes Relative: 8 %
Neutro Abs: 6.8 10*3/uL (ref 1.7–7.7)
Neutrophils Relative %: 69 %
PLATELETS: 315 10*3/uL (ref 150–400)
RBC: 3 MIL/uL — AB (ref 3.87–5.11)
RDW: 14.9 % (ref 11.5–15.5)
WBC: 9.7 10*3/uL (ref 4.0–10.5)

## 2017-09-08 MED ORDER — DOCUSATE SODIUM 50 MG/5ML PO LIQD
100.0000 mg | Freq: Two times a day (BID) | ORAL | Status: DC
  Administered 2017-09-08: 100 mg via ORAL
  Filled 2017-09-08 (×2): qty 10

## 2017-09-08 MED ORDER — TRAMADOL HCL 50 MG PO TABS: 25.0000 mg | ORAL_TABLET | Freq: Two times a day (BID) | ORAL | 0 refills | Status: DC

## 2017-09-08 MED ORDER — POLYETHYLENE GLYCOL 3350 17 G PO PACK
17.0000 g | PACK | Freq: Two times a day (BID) | ORAL | Status: DC
  Administered 2017-09-08: 17 g via ORAL
  Filled 2017-09-08: qty 1

## 2017-09-08 MED ORDER — DOCUSATE SODIUM 50 MG/5ML PO LIQD: 100.0000 mg | Freq: Two times a day (BID) | ORAL | 0 refills | Status: DC

## 2017-09-08 MED ORDER — POLYETHYLENE GLYCOL 3350 17 G PO PACK: 17.0000 g | PACK | Freq: Two times a day (BID) | ORAL | 0 refills | Status: DC

## 2017-09-08 MED ORDER — BISACODYL 10 MG RE SUPP: 10.0000 mg | Freq: Every day | RECTAL | 0 refills | Status: DC | PRN

## 2017-09-08 MED ORDER — DOCUSATE SODIUM 100 MG PO CAPS: 100.0000 mg | ORAL_CAPSULE | Freq: Two times a day (BID) | ORAL | Status: DC

## 2017-09-08 MED ORDER — BISACODYL 10 MG RE SUPP: 10.0000 mg | Freq: Every day | RECTAL | Status: DC | PRN

## 2017-09-08 MED ORDER — ASPIRIN 325 MG PO TABS: 325.0000 mg | ORAL_TABLET | Freq: Every day | ORAL | 0 refills | Status: DC

## 2017-09-08 NOTE — Plan of Care (Signed)
  Problem: Education: Goal: Knowledge of General Education information will improve Outcome: Progressing   Problem: Health Behavior/Discharge Planning: Goal: Ability to manage health-related needs will improve Outcome: Progressing Note:  Awaiting SNF placement.    Problem: Activity: Goal: Risk for activity intolerance will decrease Outcome: Progressing   Problem: Nutrition: Goal: Adequate nutrition will be maintained Outcome: Progressing   Problem: Elimination: Goal: Will not experience complications related to bowel motility Outcome: Progressing   Problem: Pain Managment: Goal: General experience of comfort will improve Outcome: Progressing   Problem: Safety: Goal: Ability to remain free from injury will improve Outcome: Progressing   Problem: Skin Integrity: Goal: Risk for impaired skin integrity will decrease Outcome: Progressing

## 2017-09-08 NOTE — Progress Notes (Addendum)
CSW reached out to patient daughter to discuss discharge plan. Patient daughter reports she has been waiting for the Director of Nursing at [facility] to call and discuss patient fall at the facility.  The daughter is unsure if the patient will return,she will determine this after talking with the director of nursing.   CSW provided patient daughter with list of bed offers.   Patient daughter agreeable to [facility].  FL2 done.  D/C Summary sent.  PTAR to transport.    Vivi BarrackNicole Segundo Makela, Theresia MajorsLCSWA, MSW Clinical Social Worker  [phone] 09/08/2017  11:39 AM

## 2017-09-08 NOTE — Progress Notes (Signed)
Report called to Vick FreesLauren Hawkins, [facility]. All questions answered. Await PTAR for transport.

## 2017-09-08 NOTE — Plan of Care (Signed)
  Problem: Education: Goal: Knowledge of General Education information will improve 09/08/2017 1849 by Gwendlyn DeutscherStevens, Giovanne Nickolson E, RN Outcome: Adequate for Discharge 09/08/2017 0945 by Gwendlyn DeutscherStevens, Adriane Gabbert E, RN Outcome: Progressing   Problem: Health Behavior/Discharge Planning: Goal: Ability to manage health-related needs will improve 09/08/2017 1849 by Gwendlyn DeutscherStevens, Morrison Masser E, RN Outcome: Adequate for Discharge 09/08/2017 0945 by Gwendlyn DeutscherStevens, Deniyah Dillavou E, RN Outcome: Progressing Note:  Awaiting SNF placement.    Problem: Activity: Goal: Risk for activity intolerance will decrease 09/08/2017 1849 by Gwendlyn DeutscherStevens, Catheline Hixon E, RN Outcome: Adequate for Discharge 09/08/2017 0945 by Gwendlyn DeutscherStevens, Nell Gales E, RN Outcome: Progressing   Problem: Nutrition: Goal: Adequate nutrition will be maintained 09/08/2017 1849 by Gwendlyn DeutscherStevens, Verdelle Valtierra E, RN Outcome: Adequate for Discharge 09/08/2017 0945 by Gwendlyn DeutscherStevens, Amous Crewe E, RN Outcome: Progressing   Problem: Elimination: Goal: Will not experience complications related to bowel motility 09/08/2017 1849 by Gwendlyn DeutscherStevens, Nikki Rusnak E, RN Outcome: Adequate for Discharge 09/08/2017 0945 by Gwendlyn DeutscherStevens, Liborio Saccente E, RN Outcome: Progressing   Problem: Pain Managment: Goal: General experience of comfort will improve 09/08/2017 1849 by Gwendlyn DeutscherStevens, [place] Dodgen E, RN Outcome: Adequate for Discharge 09/08/2017 0945 by Gwendlyn DeutscherStevens, Isabela Nardelli E, RN Outcome: Progressing   Problem: Safety: Goal: Ability to remain free from injury will improve 09/08/2017 1849 by Gwendlyn DeutscherStevens, Jathen Sudano E, RN Outcome: Adequate for Discharge 09/08/2017 0945 by Gwendlyn DeutscherStevens, Preet Mangano E, RN Outcome: Progressing   Problem: Skin Integrity: Goal: Risk for impaired skin integrity will decrease 09/08/2017 1849 by Gwendlyn DeutscherStevens, Berma Harts E, RN Outcome: Adequate for Discharge 09/08/2017 0945 by Gwendlyn DeutscherStevens, Montario Zilka E, RN Outcome: Progressing

## 2017-09-08 NOTE — Progress Notes (Signed)
  Speech Language Pathology Treatment: Dysphagia  Patient Details Name: Sherri Leonard [place] MRN: [id number] DOB: 26-Apr-1921 Today's Date: 09/08/2017 Time: 1435-1450 SLP Time Calculation (min) (ACUTE ONLY): 15 min  Assessment / Plan / Recommendation Clinical Impression  Pt to discharge today per notes, SLP noted pt with nearly full lunch tray at bedside.  Pt willing to accept intake of graham cracker, icecream and cranberry juice. Delayed swallow apparent *suspect oral transit delay* but no indication of overt aspiration.  Subtle throat clearing observed with solids requiring mastication and pt did not orally transit masticated cracker = rather opened her mouth to show SLP masticated bolus. SLP provided her with icecream to aid oral transiting. At this time, do not recommend advance diet - however anticipate pt will be appropriate for diet advancement with ongoing medical improvement.  SLP follow up at SNF indicated.  Provided updates to swallow precaution signs - Thanks for allowing me to help care for this most pleasant patient.    HPI HPI: 84 y.o.female,withsevere/advanceddementiaadmitted on 4/16/2019from [place] for left femoral neck fracture.  Underwent ORIF 4/16. Granddaughter has met with Palliative Medicine to establish [place] - at this point plan to is to provide opportunity for therapy.   Pt seen for follow up for dysphagia management.       SLP Plan  Continue with current plan of care       Recommendations  Diet recommendations: Dysphagia 1 (puree);Thin liquid Liquids provided via: Cup;Straw Medication Administration: Whole meds with puree Supervision: Full supervision/cueing for compensatory strategies Compensations: Minimize environmental distractions;Slow rate;Small sips/bites;Other (Comment)(check for oral residuals) Postural Changes and/or Swallow Maneuvers: Seated upright 90 degrees;Upright 30-60 min after meal                Oral Care  Recommendations: Oral care QID Follow up Recommendations: Skilled Nursing facility;Other (comment)(SLP follow up for dietary advancement as appropriate) SLP Visit Diagnosis: Dysphagia, oral phase (R13.11) Plan: Continue with current plan of care       GO              Luanna Salk, [facility] SLP [place], [place] 09/08/2017, 3:15 PM

## 2017-09-08 NOTE — Clinical Social Work Placement (Signed)
D/C Summary Nurse given number to call report.  4:00PM PTAR arranged for transport ZOX:[id number]  CLINICAL SOCIAL WORK PLACEMENT  NOTE  Date:  09/08/2017  Patient Details  Name: Sherri RadMable Kobus MRN: [id number] Date of Birth: 01/02/1921  Clinical Social Work is seeking post-discharge placement for this patient at the Skilled  Nursing Facility level of care (*CSW will initial, date and re-position this form in  chart as items are completed):  Yes   Patient/family provided with [facility] Clinical Social Work Department's list of facilities offering this level of care within the geographic area requested by the patient (or if unable, by the patient's family).  Yes   Patient/family informed of their freedom to choose among providers that offer the needed level of care, that participate in Medicare, Medicaid or managed care program needed by the patient, have an available bed and are willing to accept the patient.  Yes   Patient/family informed of [facility] ownership interest in [facility] and [facility], as well as of the fact that they are under no obligation to receive care at these facilities.  PASRR submitted to EDS on       PASRR number received on       Existing PASRR number confirmed on 09/08/17     FL2 transmitted to all facilities in geographic area requested by pt/family on       FL2 transmitted to all facilities within larger geographic area on 09/08/17     Patient informed that his/her managed care company has contracts with or will negotiate with certain facilities, including the following:  [facility]     No   Patient/family informed of bed offers received.  [facility]     Physician recommends and patient chooses bed at      [facility].  Patient to be transferred to facility by PTAR     Patient family notified on 09/08/17 of  transfer.  Name of family member notified:  Granddaughter: Armina     PHYSICIAN Please sign FL2     Additional Comment:    _______________________________________________ Clearance CootsNicole A Albirtha Grinage, LCSW 09/08/2017, 4:00 PM

## 2017-09-08 NOTE — Discharge Summary (Signed)
Physician Discharge Summary  Sherri Leonard MRN: [id number] DOB/AGE: 82/10/1920 82 y.o.  PCP: [name], S Ahmed, MD   Admit date: 09/02/2017 Discharge date: 09/08/2017  Discharge Diagnoses:    Principal Problem:   Closed left hip fracture [facility](HCC) Active Problems:   Dementia without behavioral disturbance   Left hip pain   Acute blood loss as cause of postoperative anemia   Palliative care by specialist   Goals of care, counseling/discussion    Follow-up recommendations Follow-up with PCP in 3-5 days , including all  additional recommended appointments as below Follow-up CBC, CMP, magnesium in 3-5 days  Follow-up with Dr. Charlann Boxerlin 2 weeks following discharge. dysphagia 1, thin liquids       Allergies as of 09/08/2017      Reactions   Ativan [lorazepam] Other (See Comments)   Causes violence    Benzodiazepines       Medication List    TAKE these medications   aspirin 325 MG tablet Take 1 tablet (325 mg total) by mouth daily.   baclofen 10 MG tablet Commonly known as:  LIORESAL Take 5 mg by mouth as needed for muscle spasms.   BIOFREEZE 4 % Gel Generic drug:  Menthol (Topical Analgesic) Apply 1 application topically every 8 (eight) hours as needed (muscle pain).   cholecalciferol 1000 units tablet Commonly known as:  VITAMIN D Take 1 tablet (1,000 Units total) by mouth daily.   ENSURE Take 237 mLs by mouth 3 (three) times daily between meals.   Melatonin 3 MG Tabs Take 3 mg by mouth at bedtime.   sertraline 100 MG tablet Commonly known as:  ZOLOFT Take 125 mg by mouth daily.   traMADol 50 MG tablet Commonly known as:  ULTRAM Take 0.5 tablets (25 mg total) by mouth 2 (two) times daily.   TYLENOL 325 MG tablet Generic drug:  acetaminophen Take 650 mg by mouth every 6 (six) hours.        Discharge Condition: stable  Discharge Instructions Get Medicines reviewed and adjusted: Please take all your medications with you for your next visit with  your Primary MD  Please request your Primary MD to go over all hospital tests and procedure/radiological results at the follow up, please ask your Primary MD to get all Hospital records sent to his/her office.  If you experience worsening of your admission symptoms, develop shortness of breath, life threatening emergency, suicidal or homicidal thoughts you must seek medical attention immediately by calling 911 or calling your MD immediately if symptoms less severe.  You must read complete instructions/literature along with all the possible adverse reactions/side effects for all the Medicines you take and that have been prescribed to you. Take any new Medicines after you have completely understood and accpet all the possible adverse reactions/side effects.   Do not drive when taking Pain medications.   Do not take more than prescribed Pain, Sleep and Anxiety Medications  Special Instructions: If you have smoked or chewed Tobacco in the last 2 yrs please stop smoking, stop any regular Alcohol and or any Recreational drug use.  Wear Seat belts while driving.  Please note  You were cared for by a hospitalist during your hospital stay. Once you are discharged, your primary care physician will handle any further medical issues. Please note that NO REFILLS for any discharge medications will be authorized once you are discharged, as it is imperative that you return to your primary care physician (or establish a relationship with a primary care physician if  you do not have one) for your aftercare needs so that they can reassess your need for medications and monitor your lab values.     Allergies  Allergen Reactions  . Ativan [Lorazepam] Other (See Comments)    Causes violence   . Benzodiazepines       Disposition:    Consults: * Maryan Rued, MD. orthopedics       Significant Diagnostic Studies:  Dg Chest 1 View  Result Date: 09/02/2017 CLINICAL DATA:  Hip fracture EXAM: CHEST   1 VIEW COMPARISON:  04/06/2015 FINDINGS: Elevation of the right diaphragm. Left-sided pacing device as before. Atelectasis at the right base. No acute consolidation or effusion. Stable cardiomediastinal silhouette. No pneumothorax. Postsurgical changes of the left humerus. IMPRESSION: No active disease.  Stable elevation of the right diaphragm. Electronically Signed   By: Jasmine Pang M.D.   On: 09/02/2017 03:00   Pelvis Portable  Result Date: 09/02/2017 CLINICAL DATA:  The patient suffered a left femoral neck fracture in a recent fall. Date of the fall is unknown. Status post left hip replacement. EXAM: PORTABLE PELVIS 1-2 VIEWS COMPARISON:  Plain films left hip 09/02/2017. FINDINGS: New left hip hemiarthroplasty is in place. The device is located. No fracture. Gas in the soft tissues from surgery noted. IMPRESSION: Status post left hip replacement.  No acute finding. Electronically Signed   By: Drusilla Kanner M.D.   On: 09/02/2017 16:14   Dg Hip Unilat With Pelvis 2-3 Views Left  Result Date: 09/02/2017 CLINICAL DATA:  Left hip pain EXAM: DG HIP (WITH OR WITHOUT PELVIS) 2-3V LEFT COMPARISON:  None. FINDINGS: Right femoral head projects in joint. Pubic symphysis and rami appear intact. Acute left femoral neck fracture with varus angulation and cephalad migration of the distal femur. About 1 bone with of anterior displacement of distal fracture fragment. IMPRESSION: Acute displaced and mildly angulated left femoral neck fracture Electronically Signed   By: Jasmine Pang M.D.   On: 09/02/2017 02:54        Filed Weights   09/04/17 0559 09/05/17 0411 09/08/17 0433  Weight: 57.2 kg (126 lb 3.2 oz) 57.8 kg (127 lb 8 oz) 58.8 kg (129 lb 11.2 oz)     Microbiology: Recent Results (from the past 240 hour(s))  MRSA PCR Screening     Status: None   Collection Time: 09/02/17  5:17 PM  Result Value Ref Range Status   MRSA by PCR NEGATIVE NEGATIVE Final    Comment:        The GeneXpert MRSA Assay  (FDA approved for NASAL specimens only), is one component of a comprehensive MRSA colonization surveillance program. It is not intended to diagnose MRSA infection nor to guide or monitor treatment for MRSA infections. Performed at [facility], [address] [place], [place] [address]        Blood Culture    Component Value Date/Time   SDES URINE, RANDOM 04/07/2015 1643   SPECREQUEST NONE 04/07/2015 1643   CULT MULTIPLE SPECIES PRESENT, SUGGEST RECOLLECTION 04/07/2015 1643   REPTSTATUS 04/09/2015 FINAL 04/07/2015 1643      Labs: Results for orders placed or performed during the hospital encounter of 09/02/17 (from the past 48 hour(s))  CBC with Differential/Platelet     Status: Abnormal   Collection Time: 09/08/17  5:39 AM  Result Value Ref Range   WBC 9.7 4.0 - 10.5 K/uL   RBC 3.00 (L) 3.87 - 5.11 MIL/uL   Hemoglobin 9.2 (L) 12.0 - 15.0 g/dL  HCT 28.1 (L) 36.0 - 46.0 %   MCV 93.7 78.0 - 100.0 fL   MCH 30.7 26.0 - 34.0 pg   MCHC 32.7 30.0 - 36.0 g/dL   RDW 09.8 11.9 - 14.7 %   Platelets 315 150 - 400 K/uL   Neutrophils Relative % 69 %   Neutro Abs 6.8 1.7 - 7.7 K/uL   Lymphocytes Relative 20 %   Lymphs Abs 1.9 0.7 - 4.0 K/uL   Monocytes Relative 8 %   Monocytes Absolute 0.7 0.1 - 1.0 K/uL   Eosinophils Relative 3 %   Eosinophils Absolute 0.3 0.0 - 0.7 K/uL   Basophils Relative 0 %   Basophils Absolute 0.0 0.0 - 0.1 K/uL    Comment: Performed at [facility], [address] [place], [place] [address]     Lipid Panel  No results found for: CHOL, TRIG, HDL, CHOLHDL, VLDL, LDLCALC, LDLDIRECT   Lab Results  Component Value Date   HGBA1C 6.6 07/27/2016   HGBA1C 6.1 (H) 04/06/2015     Lab Results  Component Value Date   CREATININE 0.63 09/05/2017     HPI :  Sherri Leonard  is a 84 y.o. female, w dementia apparently presents from [facility] for left femoral neck fracture. Pt had been  moaning for the past several days and uncertain when injury occurred .  Facility did xray and fracture was seen and patient was apparently sent to ED.     HOSPITAL COURSE:    Lt Hip Fx-  ORIF on 09/02/17 by Dr Raylene Everts. She will follow up with orthopedics as an outpatient . ASA for DVT  Pain control Follow-up with Dr. Charlann Boxer 2 weeks following discharge Patient is being discharged to SNF Continue tramadol/Tylenol for pain control  OZH:YQMVHQIO creatinine 0.7, up to 1.2 on admission. Improved  Dementia  AMS: Altered mental status on baseline.  Speech slurred and unrecognizable.  Continue supportive care.  Symptomatic acute blood loss anemia- Due to postop blood loss,hemoglobin down to 6.8 from 9.2. Pt hadpersistent hypotension and hemodynamic instability requiring IV fluids and transfusion of 2 units of packed cells on 09/03/2017. Hemoglobin and hematocrit is stable  Leukocytosis: Stable  Diarrhea: Resolved, no stool studies done  Goals of care:  Palliative care discussed with family.  Currently she remains full code.  Palliative care will initiate hospice options if she continues to decline incase. But she is comfortable at present.  Hypomagnesemia: Supplemented.  Dysphagia: Speech following.  Started on dysphagia 1 diet     Discharge Exam:   Blood pressure 120/67, pulse 93, temperature 99 F (37.2 C), temperature source Oral, resp. rate 18, height 5\' 5"  (1.651 m), weight 58.8 kg (129 lb 11.2 oz), SpO2 100 %.  General exam: Appears calm and comfortable ,Not in distress, demented elderly female HEENT:PERRL,Oral mucosa moist, Ear/Nose normal on gross exam Respiratory system: Bilateral equal air entry, normal vesicular breath sounds, no wheezes or crackles  Cardiovascular system: S1 & S2 heard, RRR. No JVD, murmurs, rubs, gallops or clicks. No pedal edema. Gastrointestinal system: Abdomen is nondistended, soft and nontender. No organomegaly or masses felt.  Normal bowel sounds heard. Central nervous system: Alert but not  oriented. confused  Extremities: Brace on the left lower extremity, clean surgical incision on the left hip. Skin: No rashes, lesions or ulcers,no icterus ,no pallor Psychiatry: Judgement and insight appear impaired      Follow-up Information    Durene Romans, MD. Schedule an appointment as soon  as possible for a visit in 2 weeks.   Specialty:  Orthopedic Surgery Contact information: [address] [place] [address] [phone]           Signed: Richarda Overlie 09/08/2017, 8:29 AM        Time spent >1 hour

## 2017-09-08 NOTE — Progress Notes (Signed)
   Discussed with grand daughter , she is waiting for [facility] to investigate  Why she fell. explained to her she is medically ready for discharge  She was explained that she  can select any rehab she chooses  And patient is stable for discharge

## 2017-09-09 ENCOUNTER — Non-Acute Institutional Stay: Payer: Self-pay | Admitting: Hospice and Palliative Medicine

## 2017-09-09 DIAGNOSIS — Z515 Encounter for palliative care: Secondary | ICD-10-CM

## 2017-09-09 NOTE — Progress Notes (Signed)
Attempted to see patient today. Learned that she had been hospitalized and was discharged to an outside facility. Will discharge from community palliative care pending a new order from [facility].

## 2017-09-23 ENCOUNTER — Non-Acute Institutional Stay: Payer: Self-pay | Admitting: Hospice and Palliative Medicine

## 2017-09-23 DIAGNOSIS — Z515 Encounter for palliative care: Secondary | ICD-10-CM

## 2017-09-23 NOTE — Progress Notes (Signed)
PALLIATIVE CARE CONSULT VISIT   PATIENT NAME: Sherri Leonard DOB: May 28, 1920 MRN: [id number]  PRIMARY CARE PROVIDER: Sherron Monday, MD  REFERRING PROVIDER: Dr. Donette Larry  RESPONSIBLE PARTY:   Arah Aro, granddaughter [phone])   RECOMMENDATIONS and PLAN:  1. Weakness - receiving rehab. Her functional baseline is quite poor due to advanced dementia. Patient is essentially chair/bedbound and requires complete assistance with all ADLs. FAST 7B.  2. Pain - has tramadol ordered but currently appears comfortable.  3. GOC - I note that patient was seen inpatient by the PMT and family opted for full code. Per my records, we had completed a MOST form in 2017 detailing DNR and limited scope of treatment. However, family opted for full code during the recent hospitalization. Will clarify goals with family. I would recommend DNR and consideration for hospice in the event of further decline.   I spent 30 minutes providing this consultation,  from 1200 to 1230. More than 50% of the time in this consultation was spent coordinating communication.   HISTORY OF PRESENT ILLNESS:  Sherri Leonard is a 84 y.o. year old female with multiple medical problems including advanced dementia, who was hospitalized 09/02/17 to 09/08/17 with L. Hip fracture and is s/p ORIF. Patient was sent to rehab at [facility]. Palliative care has been asked to follow. Patient is familiar to me from her previous SNF.   CODE STATUS: FULL CODE  PPS: 30% HOSPICE ELIGIBILITY/DIAGNOSIS: TBD  PAST MEDICAL HISTORY:  Past Medical History:  Diagnosis Date  . Dementia   . OA (osteoarthritis)   . Pacemaker     SOCIAL HX:  Social History   Tobacco Use  . Smoking status: Never Smoker  . Smokeless tobacco: Never Used  Substance Use Topics  . Alcohol use: No    Alcohol/week: 0.0 oz    ALLERGIES:  Allergies  Allergen Reactions  . Ativan [Lorazepam] Other (See Comments)    Causes violence   .  Benzodiazepines      PERTINENT MEDICATIONS:  Outpatient Encounter Medications as of 09/23/2017  Medication Sig  . acetaminophen (TYLENOL) 325 MG tablet Take 650 mg by mouth every 6 (six) hours.   Marland Kitchen aspirin 325 MG tablet Take 1 tablet (325 mg total) by mouth daily.  . baclofen (LIORESAL) 10 MG tablet Take 5 mg by mouth as needed for muscle spasms.  . bisacodyl (DULCOLAX) 10 MG suppository Place 1 suppository (10 mg total) rectally daily as needed for moderate constipation.  . cholecalciferol (VITAMIN D) 1000 UNITS tablet Take 1 tablet (1,000 Units total) by mouth daily.  Marland Kitchen docusate (COLACE) 50 MG/5ML liquid Take 10 mLs (100 mg total) by mouth 2 (two) times daily.  Marland Kitchen ENSURE (ENSURE) Take 237 mLs by mouth 3 (three) times daily between meals.  . Melatonin 3 MG TABS Take 3 mg by mouth at bedtime.   . Menthol, Topical Analgesic, (BIOFREEZE) 4 % GEL Apply 1 application topically every 8 (eight) hours as needed (muscle pain).  . polyethylene glycol (MIRALAX / GLYCOLAX) packet Take 17 g by mouth 2 (two) times daily.  . sertraline (ZOLOFT) 100 MG tablet Take 125 mg by mouth daily.   . traMADol (ULTRAM) 50 MG tablet Take 0.5 tablets (25 mg total) by mouth 2 (two) times daily.   No facility-administered encounter medications on file as of 09/23/2017.     PHYSICAL EXAM:   General: NAD, frail appearing, thin, lying in bed Cardiovascular: regular rate and rhythm Pulmonary: nonlabored Abdomen: soft, nontender, + bowel sounds  GU: no suprapubic tenderness Extremities: no edema, L. Leg in cast Skin: no rashes Neurological: Weakness, confusion  Malachy Moan, NP

## 2017-09-26 ENCOUNTER — Emergency Department (HOSPITAL_COMMUNITY): Payer: Medicare Other

## 2017-09-26 ENCOUNTER — Inpatient Hospital Stay (HOSPITAL_COMMUNITY)
Admission: EM | Admit: 2017-09-26 | Discharge: 2017-10-02 | DRG: 871 | Disposition: A | Discharge status: Hospice | Payer: Medicare Other | Attending: Internal Medicine | Admitting: Internal Medicine

## 2017-09-26 ENCOUNTER — Encounter (HOSPITAL_COMMUNITY): Payer: Self-pay

## 2017-09-26 ENCOUNTER — Other Ambulatory Visit: Payer: Self-pay

## 2017-09-26 DIAGNOSIS — L8990 Pressure ulcer of unspecified site, unspecified stage: Secondary | ICD-10-CM

## 2017-09-26 DIAGNOSIS — Z7401 Bed confinement status: Secondary | ICD-10-CM | POA: Diagnosis not present

## 2017-09-26 DIAGNOSIS — Z96653 Presence of artificial knee joint, bilateral: Secondary | ICD-10-CM | POA: Diagnosis present

## 2017-09-26 DIAGNOSIS — F0390 Unspecified dementia without behavioral disturbance: Secondary | ICD-10-CM | POA: Diagnosis present

## 2017-09-26 DIAGNOSIS — F329 Major depressive disorder, single episode, unspecified: Secondary | ICD-10-CM

## 2017-09-26 DIAGNOSIS — R627 Adult failure to thrive: Secondary | ICD-10-CM | POA: Diagnosis present

## 2017-09-26 DIAGNOSIS — E860 Dehydration: Secondary | ICD-10-CM

## 2017-09-26 DIAGNOSIS — Z888 Allergy status to other drugs, medicaments and biological substances status: Secondary | ICD-10-CM

## 2017-09-26 DIAGNOSIS — F0393 Unspecified dementia, unspecified severity, with mood disturbance: Secondary | ICD-10-CM | POA: Diagnosis present

## 2017-09-26 DIAGNOSIS — N179 Acute kidney failure, unspecified: Secondary | ICD-10-CM | POA: Diagnosis present

## 2017-09-26 DIAGNOSIS — G20 Parkinson's disease: Secondary | ICD-10-CM | POA: Diagnosis present

## 2017-09-26 DIAGNOSIS — L89150 Pressure ulcer of sacral region, unstageable: Secondary | ICD-10-CM | POA: Diagnosis present

## 2017-09-26 DIAGNOSIS — Z79899 Other long term (current) drug therapy: Secondary | ICD-10-CM | POA: Diagnosis not present

## 2017-09-26 DIAGNOSIS — R7989 Other specified abnormal findings of blood chemistry: Secondary | ICD-10-CM

## 2017-09-26 DIAGNOSIS — R778 Other specified abnormalities of plasma proteins: Secondary | ICD-10-CM

## 2017-09-26 DIAGNOSIS — A419 Sepsis, unspecified organism: Secondary | ICD-10-CM | POA: Diagnosis present

## 2017-09-26 DIAGNOSIS — Z66 Do not resuscitate: Secondary | ICD-10-CM | POA: Diagnosis present

## 2017-09-26 DIAGNOSIS — D649 Anemia, unspecified: Secondary | ICD-10-CM | POA: Diagnosis not present

## 2017-09-26 DIAGNOSIS — Z681 Body mass index (BMI) 19 or less, adult: Secondary | ICD-10-CM

## 2017-09-26 DIAGNOSIS — G92 Toxic encephalopathy: Secondary | ICD-10-CM | POA: Diagnosis present

## 2017-09-26 DIAGNOSIS — Z515 Encounter for palliative care: Secondary | ICD-10-CM | POA: Diagnosis present

## 2017-09-26 DIAGNOSIS — E876 Hypokalemia: Secondary | ICD-10-CM | POA: Diagnosis present

## 2017-09-26 DIAGNOSIS — L89159 Pressure ulcer of sacral region, unspecified stage: Secondary | ICD-10-CM | POA: Diagnosis not present

## 2017-09-26 DIAGNOSIS — I495 Sick sinus syndrome: Secondary | ICD-10-CM | POA: Diagnosis present

## 2017-09-26 DIAGNOSIS — Z7189 Other specified counseling: Secondary | ICD-10-CM | POA: Diagnosis not present

## 2017-09-26 DIAGNOSIS — Z7982 Long term (current) use of aspirin: Secondary | ICD-10-CM

## 2017-09-26 DIAGNOSIS — F0280 Dementia in other diseases classified elsewhere without behavioral disturbance: Secondary | ICD-10-CM | POA: Diagnosis present

## 2017-09-26 DIAGNOSIS — G301 Alzheimer's disease with late onset: Secondary | ICD-10-CM | POA: Diagnosis not present

## 2017-09-26 DIAGNOSIS — I21A1 Myocardial infarction type 2: Secondary | ICD-10-CM | POA: Diagnosis present

## 2017-09-26 DIAGNOSIS — E870 Hyperosmolality and hypernatremia: Secondary | ICD-10-CM

## 2017-09-26 LAB — CBC WITH DIFFERENTIAL/PLATELET
Basophils Absolute: 0 10*3/uL (ref 0.0–0.1)
Basophils Relative: 0 %
EOS PCT: 1 %
Eosinophils Absolute: 0.1 10*3/uL (ref 0.0–0.7)
HCT: 35.3 % — ABNORMAL LOW (ref 36.0–46.0)
HEMOGLOBIN: 10.8 g/dL — AB (ref 12.0–15.0)
LYMPHS ABS: 1.5 10*3/uL (ref 0.7–4.0)
LYMPHS PCT: 14 %
MCH: 30 pg (ref 26.0–34.0)
MCHC: 30.6 g/dL (ref 30.0–36.0)
MCV: 98.1 fL (ref 78.0–100.0)
Monocytes Absolute: 0.5 10*3/uL (ref 0.1–1.0)
Monocytes Relative: 5 %
NEUTROS PCT: 80 %
Neutro Abs: 8.5 10*3/uL — ABNORMAL HIGH (ref 1.7–7.7)
PLATELETS: 237 10*3/uL (ref 150–400)
RBC: 3.6 MIL/uL — AB (ref 3.87–5.11)
RDW: 15.7 % — ABNORMAL HIGH (ref 11.5–15.5)
WBC: 10.5 10*3/uL (ref 4.0–10.5)

## 2017-09-26 LAB — COMPREHENSIVE METABOLIC PANEL
ALK PHOS: 106 U/L (ref 38–126)
ALT: 7 U/L — AB (ref 14–54)
AST: 20 U/L (ref 15–41)
Albumin: 2.8 g/dL — ABNORMAL LOW (ref 3.5–5.0)
Anion gap: 15 (ref 5–15)
BUN: 35 mg/dL — AB (ref 6–20)
CALCIUM: 8.6 mg/dL — AB (ref 8.9–10.3)
CO2: 24 mmol/L (ref 22–32)
CREATININE: 1.23 mg/dL — AB (ref 0.44–1.00)
Chloride: 115 mmol/L — ABNORMAL HIGH (ref 101–111)
GFR, EST AFRICAN AMERICAN: 42 mL/min — AB (ref >60)
GFR, EST NON AFRICAN AMERICAN: 36 mL/min — AB (ref >60)
Glucose, Bld: 126 mg/dL — ABNORMAL HIGH (ref 65–99)
Potassium: 3.7 mmol/L (ref 3.5–5.1)
Sodium: 154 mmol/L — ABNORMAL HIGH (ref 135–145)
Total Bilirubin: 0.7 mg/dL (ref 0.3–1.2)
Total Protein: 7.5 g/dL (ref 6.5–8.1)

## 2017-09-26 LAB — URINALYSIS, ROUTINE W REFLEX MICROSCOPIC
GLUCOSE, UA: NEGATIVE mg/dL
Ketones, ur: 5 mg/dL — AB
LEUKOCYTES UA: NEGATIVE
Nitrite: NEGATIVE
PH: 5 (ref 5.0–8.0)
Protein, ur: 100 mg/dL — AB
SPECIFIC GRAVITY, URINE: 1.023 (ref 1.005–1.030)

## 2017-09-26 LAB — POC OCCULT BLOOD, ED: FECAL OCCULT BLD: NEGATIVE

## 2017-09-26 LAB — TROPONIN I: TROPONIN I: 0.11 ng/mL — AB (ref <0.03)

## 2017-09-26 LAB — I-STAT CG4 LACTIC ACID, ED: Lactic Acid, Venous: 1.59 mmol/L (ref 0.5–1.9)

## 2017-09-26 LAB — PROTIME-INR
INR: 1.24
Prothrombin Time: 15.5 seconds — ABNORMAL HIGH (ref 11.4–15.2)

## 2017-09-26 LAB — C DIFFICILE QUICK SCREEN W PCR REFLEX
C Diff antigen: NEGATIVE
C Diff interpretation: NOT DETECTED
C Diff toxin: NEGATIVE

## 2017-09-26 MED ORDER — DEXTROSE 5 % IV SOLN
INTRAVENOUS | Status: DC
Start: 2017-09-26 — End: 2017-10-01
  Administered 2017-09-26 – 2017-09-28 (×4): via INTRAVENOUS

## 2017-09-26 MED ORDER — SODIUM CHLORIDE 0.9 % IV SOLN
2.0000 g | INTRAVENOUS | Status: DC
  Administered 2017-09-27 – 2017-10-01 (×5): 2 g via INTRAVENOUS
  Filled 2017-09-26 (×5): qty 2

## 2017-09-26 MED ORDER — SODIUM CHLORIDE 0.9 % IV SOLN
INTRAVENOUS | Status: DC
  Administered 2017-09-26: 12:00:00 via INTRAVENOUS

## 2017-09-26 MED ORDER — ENOXAPARIN SODIUM 30 MG/0.3ML ~~LOC~~ SOLN
30.0000 mg | SUBCUTANEOUS | Status: DC
  Administered 2017-09-26 – 2017-09-27 (×2): 30 mg via SUBCUTANEOUS
  Filled 2017-09-26 (×2): qty 0.3

## 2017-09-26 MED ORDER — ACETAMINOPHEN 325 MG PO TABS: 650.0000 mg | ORAL_TABLET | Freq: Four times a day (QID) | ORAL | Status: DC | PRN

## 2017-09-26 MED ORDER — ACETAMINOPHEN 650 MG RE SUPP
650.0000 mg | Freq: Four times a day (QID) | RECTAL | Status: DC | PRN
  Administered 2017-09-26 – 2017-09-30 (×4): 650 mg via RECTAL
  Filled 2017-09-26 (×4): qty 1

## 2017-09-26 MED ORDER — SODIUM CHLORIDE 0.9 % IV BOLUS
500.0000 mL | Freq: Once | INTRAVENOUS | Status: AC
  Administered 2017-09-26: 500 mL via INTRAVENOUS

## 2017-09-26 MED ORDER — SODIUM CHLORIDE 0.9 % IV SOLN
2.0000 g | INTRAVENOUS | Status: AC
  Administered 2017-09-26: 2 g via INTRAVENOUS
  Filled 2017-09-26: qty 2

## 2017-09-26 NOTE — H&P (Addendum)
HPI  Sherri Leonard WGN:[id number] DOB: 12/10/1920 DOA: 09/26/2017  PCP: Sherri Monday, MD   Chief Complaint: Altered mental status  HPI:  84 year old female resident of [facility] nursing facility Sick sinus syndrome with pacemaker possible Parkinson's bipolar anemia osteoarthritis Admitted 4/16-4/22 left hip fracture and at that admission placed on dysphagia 1 diet-sustained some AKI and is known to have dementia--hospital visit at that time patient was informed by my partner Dr. Susie Cassette that prognosis may be guarded given her advanced age and hip fracture She returns from [facility] nursing facility after having been rehabilitated there and is floridly septic and dehydrated-she had a T-max of 100 and her labs are as below she is unable to provide any meaningful history   I discussed with her [facility] POA [name]-tells me that the patient was at [facility] since 2016 and had a significant decline since then-she then was found to have a pathological fracture went to the hospital for fracture repair and has declined since-she has not really been mobile at the nursing facility although she has been transferred from bed to chair    ED Course: Patient was given IV saline bolus because of low blood pressure not started on antibiotics  Review of Systems:  Cannot review as patient is demented level 5 caveat   Past Medical History:  Diagnosis Date  . Dementia   . OA (osteoarthritis)   . Pacemaker     Past Surgical History:  Procedure Laterality Date  . ATRIAL CARDIAC PACEMAKER INSERTION  2012  . BRAIN SURGERY  2013  . CARPAL TUNNEL RELEASE Bilateral   . FLEXIBLE SIGMOIDOSCOPY N/A 09/12/2014   Procedure: FLEXIBLE SIGMOIDOSCOPY;  Surgeon: Beverley Fiedler, MD;  Location: [facility] END[facility];  Service: Gastroenterology;  Laterality: N/A;  . HIP ARTHROPLASTY Left 09/02/2017   Procedure: ARTHROPLASTY BIPOLAR HIP (HEMIARTHROPLASTY);  Surgeon: Durene Romans, MD;  Location: [facility]  ORS;  Service: Orthopedics;  Laterality: Left;  . KNEE ARTHROPLASTY Bilateral      reports that she has never smoked. She has never used smokeless tobacco. She reports that she does not drink alcohol or use drugs. Mobility: Immobile and total care  Allergies  Allergen Reactions  . Lorazepam Other (See Comments)    Causes violence  Other reaction(s): Agitation  . Benzodiazepines     Family History  Problem Relation Age of Onset  . Heart disease Son   . Cancer Son 60       on Liver  . Alzheimer's disease Mother   . Alzheimer's disease Father   . Heart attack Son   . Colon polyps Neg Hx   . Diabetes Neg Hx   . Kidney disease Neg Hx      Prior to Admission medications   Medication Sig Start Date End Date Taking? Authorizing Provider  acetaminophen (TYLENOL) 325 MG tablet Take 650 mg by mouth every 6 (six) hours.  08/28/16  Yes Pecola Lawless, MD  aspirin 325 MG tablet Take 1 tablet (325 mg total) by mouth daily. 09/08/17  Yes Richarda Overlie, MD  baclofen (LIORESAL) 10 MG tablet Take 5 mg by mouth as needed for muscle spasms.   Yes [provider]  cholecalciferol (VITAMIN D) 1000 UNITS tablet Take 1 tablet (1,000 Units total) by mouth daily. 03/30/15  Yes Sharon Seller, NP  ENSURE (ENSURE) Take 237 mLs by mouth 3 (three) times daily between meals.   Yes [provider]  Melatonin 3 MG TABS Take 3 mg by mouth  at bedtime.    Yes [provider]  Menthol, Topical Analgesic, (BIOFREEZE) 4 % GEL Apply 1 application topically every 8 (eight) hours as needed (muscle pain).   Yes [provider]  sertraline (ZOLOFT) 100 MG tablet Take 125 mg by mouth daily.    Yes [provider]  traMADol (ULTRAM) 50 MG tablet Take 0.5 tablets (25 mg total) by mouth 2 (two) times daily. 09/08/17  Yes Richarda Overlie, MD  bisacodyl (DULCOLAX) 10 MG suppository Place 1 suppository (10 mg total) rectally daily as needed for moderate constipation. 09/08/17    Richarda Overlie, MD  docusate (COLACE) 50 MG/5ML liquid Take 10 mLs (100 mg total) by mouth 2 (two) times daily. 09/08/17   Richarda Overlie, MD  polyethylene glycol (MIRALAX / GLYCOLAX) packet Take 17 g by mouth 2 (two) times daily. 09/08/17   Richarda Overlie, MD    Physical Exam:  Vitals:   09/26/17 1730 09/26/17 1731  BP: 126/84 126/84  Pulse:  88  Resp:  19  Temp:    SpO2:  100%     Demented moaning and groaning severe wasting bitemporal musculature and supraclavicular fossa S1-S2 tachycardic poor exam given patient continuously moaning  Chest is clinically clear  Patient has a large bedsore in the natal cleft and lower sacral area which does not appear to be oozing  Left hip surgery wound looks clean and I remove the dressing reflexes are 2/3 I am not able to assess power I am not able to assess orientation  I have personally reviewed following labs and imaging studies  Labs:   Sodium 154 BUN/creatinine 35/1.22 and BUN/creatinine 20/0.6 point-of-care troponin is 0.11  Imaging studies:   CT head and chest x-ray examined  Medical tests:   EKG independently reviewed: Sinus tach bundle branch block which is chronic since 09/02/2017  Test discussed with performing physician:  Discussed with Dr. Clarene Duke regarding plan  Decision to obtain old records:   None  Review and summation of old records:     Active Problems:   Acute kidney injury (HCC)   Assessment/Plan Adult failure to thrive Fever volume depletion with AKI and hypernatremia -I do not see a source other than in the sacrum I would hesitate to scan her and had a long discussion with her granddaughter about options If we find osteomyelitis I do not see a good ending in terms of patient recovering from the same and the risks of long-term antibiotics far outweigh the benefits in terms of opportunistic infection C. difficile and AKI  -I think it is reasonable at this time to start cefepime to cover however if she  does not make a meaningful recovery with IV fluid repletion of D5 at 50 cc an hour over the next 3 days with a drop in her sodium  Then, -hospice should be called in and I will ask palliative care to arrange the same and reconsult with the family who I spoke with I spent about 15 minutes face-to-face time discussing the plan with the [facility] POA proxy.   Toxic metabolic encephalopathy secondary to AKI and polypharmacy as below Keep n.p.o. holding Zoloft 125 tramadol 0.5 aspirin 325 baclofen 5 daily for spasm-in retrospect I think tramadol and baclofen could have exacerbated metabolic encephalopathy  ty2 MI  I am not worried about her troponin as I think that this is likely secondary to AKI and it would not change management in someone with dementia and advanced age  Chronic bundle branch block with sick  sinus syndrome-stable at this time  Bedsore acquired unclear when-we will need turning every 2 hourly as possible  ?  Mouth sore-needs mouth care    Severity of Illness: The appropriate patient status for this patient is INPATIENT. Inpatient status is judged to be reasonable and necessary in order to provide the required intensity of service to ensure the patient's safety. The patient's presenting symptoms, physical exam findings, and initial radiographic and laboratory data in the context of their chronic comorbidities is felt to place them at high risk for further clinical deterioration. Furthermore, it is not anticipated that the patient will be medically stable for discharge from the hospital within 2 midnights of admission. The following factors support the patient status of inpatient.   " The patient's presenting symptoms include sepsis. " The worrisome physical exam findings include bedsore as well as dry mood mucosa. " The initial radiographic and laboratory data are worrisome because of significant metabolic abnormalities. " The chronic co-morbidities include dementia etc. etc.   * I  certify that at the point of admission it is my clinical judgment that the patient will require inpatient hospital care spanning beyond 2 midnights from the point of admission due to high intensity of service, high risk for further deterioration and high frequency of surveillance required.*     DVT prophylaxis: Lovenox Code Status: DNR confirmed at the bedside after long discussion with family Family Communication: See above discussion Consults called: Palliative care  Time spent: 70 minutes  Anson Peddie, MD  [facility] Direct contact: [phone] --Via amion app OR  --[email or web address]; password [email or web address]  7PM-7AM contact night coverage as above  09/26/2017, 5:43 PM

## 2017-09-26 NOTE — ED Notes (Signed)
When rolling pt stool comes out.

## 2017-09-26 NOTE — ED Triage Notes (Signed)
Pt coming from [facility]. Pt recently had hip replacement sx. Pt is now not acting herself- pt is normally A&Ox4. Pt was tachy and hypotensive with EMS

## 2017-09-26 NOTE — ED Notes (Signed)
Bed: WA17 Expected date:  Expected time:  Means of arrival:  Comments: Poss code sepsis

## 2017-09-26 NOTE — Progress Notes (Deleted)
HPI  Sherri Leonard ZOX:[id number] DOB: September 12, 1920 DOA: 09/26/2017  PCP: Sherron Monday, MD   Chief Complaint: Altered mental status  HPI:  84 year old female resident of [facility] other nursing facility Sick sinus syndrome with pacemaker possible Parkinson's bipolar anemia osteoarthritis Admitted 4/16-4/22 left hip fracture and at that admission placed on dysphagia 1 diet-sustained some AKI and is known to have dementia--hospital visit at that time patient was informed by my partner Dr. Susie Cassette that prognosis may be guarded given her advanced age and hip fracture She returns from [place] nursing facility after having been rehabilitated there and is floridly septic and dehydrated-she had a T-max of 100 and her labs are as below she is unable to provide any meaningful history   I discussed with her [facility] POA [name]-tells me that the patient was at [facility] since 2016 and had a significant decline since then-she then was found to have a pathological fracture went to the hospital for fracture repair and has declined since-she has not really been mobile at the nursing facility although she has been transferred from bed to chair    ED Course: Patient was given IV saline bolus because of low blood pressure not started on antibiotics  Review of Systems:  Cannot review as patient is demented level 5 caveat   Past Medical History:  Diagnosis Date  . Dementia   . OA (osteoarthritis)   . Pacemaker     Past Surgical History:  Procedure Laterality Date  . ATRIAL CARDIAC PACEMAKER INSERTION  2012  . BRAIN SURGERY  2013  . CARPAL TUNNEL RELEASE Bilateral   . FLEXIBLE SIGMOIDOSCOPY N/A 09/12/2014   Procedure: FLEXIBLE SIGMOIDOSCOPY;  Surgeon: Beverley Fiedler, MD;  Location: [facility];  Service: Gastroenterology;  Laterality: N/A;  . HIP ARTHROPLASTY Left 09/02/2017   Procedure: ARTHROPLASTY BIPOLAR HIP (HEMIARTHROPLASTY);  Surgeon: Durene Romans, MD;  Location: [facility]  ORS;  Service: Orthopedics;  Laterality: Left;  . KNEE ARTHROPLASTY Bilateral      reports that she has never smoked. She has never used smokeless tobacco. She reports that she does not drink alcohol or use drugs. Mobility: Immobile and total care  Allergies  Allergen Reactions  . Lorazepam Other (See Comments)    Causes violence  Other reaction(s): Agitation  . Benzodiazepines     Family History  Problem Relation Age of Onset  . Heart disease Son   . Cancer Son 60       on Liver  . Alzheimer's disease Mother   . Alzheimer's disease Father   . Heart attack Son   . Colon polyps Neg Hx   . Diabetes Neg Hx   . Kidney disease Neg Hx      Prior to Admission medications   Medication Sig Start Date End Date Taking? Authorizing Provider  acetaminophen (TYLENOL) 325 MG tablet Take 650 mg by mouth every 6 (six) hours.  08/28/16  Yes Pecola Lawless, MD  aspirin 325 MG tablet Take 1 tablet (325 mg total) by mouth daily. 09/08/17  Yes Richarda Overlie, MD  baclofen (LIORESAL) 10 MG tablet Take 5 mg by mouth as needed for muscle spasms.   Yes [provider]  cholecalciferol (VITAMIN D) 1000 UNITS tablet Take 1 tablet (1,000 Units total) by mouth daily. 03/30/15  Yes Sharon Seller, NP  ENSURE (ENSURE) Take 237 mLs by mouth 3 (three) times daily between meals.   Yes [provider]  Melatonin 3 MG TABS Take 3 mg by mouth  at bedtime.    Yes [provider]  Menthol, Topical Analgesic, (BIOFREEZE) 4 % GEL Apply 1 application topically every 8 (eight) hours as needed (muscle pain).   Yes [provider]  sertraline (ZOLOFT) 100 MG tablet Take 125 mg by mouth daily.    Yes [provider]  traMADol (ULTRAM) 50 MG tablet Take 0.5 tablets (25 mg total) by mouth 2 (two) times daily. 09/08/17  Yes Richarda Overlie, MD  bisacodyl (DULCOLAX) 10 MG suppository Place 1 suppository (10 mg total) rectally daily as needed for moderate constipation. 09/08/17    Richarda Overlie, MD  docusate (COLACE) 50 MG/5ML liquid Take 10 mLs (100 mg total) by mouth 2 (two) times daily. 09/08/17   Richarda Overlie, MD  polyethylene glycol (MIRALAX / GLYCOLAX) packet Take 17 g by mouth 2 (two) times daily. 09/08/17   Richarda Overlie, MD    Physical Exam:  Vitals:   09/26/17 1330 09/26/17 1400  BP: 117/76 (!) 110/95  Pulse: 97 (!) 102  Resp: 10 10  Temp:    SpO2: 100% 93%     Demented moaning and groaning severe wasting bitemporal musculature and supraclavicular fossa S1-S2 tachycardic poor exam given patient continuously moaning  Chest is clinically clear  Patient has a large bedsore in the natal cleft and lower sacral area which does not appear to be oozing  Left hip surgery wound looks clean and I remove the dressing reflexes are 2/3 I am not able to assess power I am not able to assess orientation  I have personally reviewed following labs and imaging studies  Labs:   Sodium 154 BUN/creatinine 35/1.22 and BUN/creatinine 20/0.6 point-of-care troponin is 0.11  Imaging studies:   CT head and chest x-ray examined  Medical tests:   EKG independently reviewed: Sinus tach bundle branch block which is chronic since 09/02/2017  Test discussed with performing physician:  Discussed with Dr. Clarene Duke regarding plan  Decision to obtain old records:   None  Review and summation of old records:     Active Problems:   * No active hospital problems. *   Assessment/Plan Adult failure to thrive Fever volume depletion with AKI and hypernatremia -I do not see a source other than in the sacrum I would hesitate to scan her and had a long discussion with her granddaughter about options If we find osteomyelitis I do not see a good ending in terms of patient recovering from the same and the risks of long-term antibiotics far outweigh the benefits in terms of opportunistic infection C. difficile and AKI  -I think it is reasonable at this time to start cefepime to  cover however if she does not make a meaningful recovery with IV fluid repletion of D5 at 50 cc an hour over the next 3 days with a drop in her sodium  -hospice should be called in and I will ask palliative care to arrange the same and reconsult with the family who I spoke with I spent about 15 minutes face-to-face time discussing the plan with the [facility] POA proxy.   Toxic metabolic encephalopathy secondary to AKI and polypharmacy as below Keep n.p.o. holding Zoloft 125 tramadol 0.5 aspirin 325 baclofen 5 daily for spasm-in retrospect I think tramadol and baclofen could have exacerbated metabolic encephalopathy  ty2 MI  I am not worried about her troponin as I think that this is likely secondary to AKI and it would not change management in someone with dementia and advanced age  Chronic bundle branch  block with sick sinus syndrome-stable at this time  Bedsore acquired unclear when-we will need turning every 2 hourly as possible  ?  Mouth sore-needs mouth care    Severity of Illness: The appropriate patient status for this patient is INPATIENT. Inpatient status is judged to be reasonable and necessary in order to provide the required intensity of service to ensure the patient's safety. The patient's presenting symptoms, physical exam findings, and initial radiographic and laboratory data in the context of their chronic comorbidities is felt to place them at high risk for further clinical deterioration. Furthermore, it is not anticipated that the patient will be medically stable for discharge from the hospital within 2 midnights of admission. The following factors support the patient status of inpatient.   " The patient's presenting symptoms include sepsis. " The worrisome physical exam findings include bedsore as well as dry mood mucosa. " The initial radiographic and laboratory data are worrisome because of significant metabolic abnormalities. " The chronic co-morbidities include dementia etc.  etc.   * I certify that at the point of admission it is my clinical judgment that the patient will require inpatient hospital care spanning beyond 2 midnights from the point of admission due to high intensity of service, high risk for further deterioration and high frequency of surveillance required.*     DVT prophylaxis: Lovenox Code Status: DNR confirmed at the bedside after long discussion with family Family Communication: See above discussion Consults called: Palliative care  Time spent: 70 minutes  Phares Zaccone, MD  [facility] Direct contact: [phone] --Via amion app OR  --[email or web address]; password [email or web address]  7PM-7AM contact night coverage as above  09/26/2017, 2:12 PM

## 2017-09-26 NOTE — Progress Notes (Addendum)
Pharmacy Antibiotic Note  Sherri Leonard is a 84 y.o. female admitted on 09/26/2017 with AMS, fever, volume depletion, AKI, and hypernatremia. Patient recently underwent left hip hemiarthroplasty on 09/02/2017. Pharmacy has been consulted for Cefepime dosing for possible wound infection.  Plan: Cefepime 2g IV q24h  Monitor renal function, cultures, clinical course.   Height:  (165.1 cm) Weight: 125 lb (56.7 kg) IBW/kg (Calculated) : 57  Temp (24hrs), Avg:99.1 F (37.3 C), Min:97.9 F (36.6 C), Max:100.3 F (37.9 C)  Recent Labs  Lab 09/26/17 1139 09/26/17 1140  WBC 10.5  --   CREATININE 1.23*  --   LATICACIDVEN  --  1.59    Estimated Creatinine Clearance: 25 mL/min (A) (by C-G formula based on SCr of 1.23 mg/dL (H)).    Allergies  Allergen Reactions  . Lorazepam Other (See Comments)    Causes violence  Other reaction(s): Agitation  . Benzodiazepines     Antimicrobials this admission: 5/10 >> Cefepime >>  Dose adjustments this admission: --  Microbiology results: 5/10 BCx: sent 5/10 UCx: sent  5/10 C.diff PCR: sent 5/10 GI panel: sent  Thank you for allowing pharmacy to be a part of this patient's care.   Greer Pickerel, PharmD, BCPS Pager: [phone] 09/26/2017 3:13 PM

## 2017-09-26 NOTE — ED Provider Notes (Signed)
[facility]   CSN: [id number] Arrival date & time: 09/26/17  1029     History   Chief Complaint Chief Complaint  Patient presents with  . Code Sepsis    HPI Sherri Leonard is a 84 y.o. female.  The history is provided by the EMS personnel and the nursing home. The history is limited by the condition of the patient (Hx dementia).   Pt was seen at 1100. Per EMS and NH report: Pt with unknown onset of "not acting like herself" since left hip replacement on 09/08/17.  Has been associated with poor PO intake. EMS states pt was "tachycardic and hypotensive" on scene. No reported fevers. Pt has significant hx of dementia and is bedbound at baseline.    Past Medical History:  Diagnosis Date  . Dementia   . OA (osteoarthritis)   . Pacemaker     Patient Active Problem List   Diagnosis Date Noted  . Palliative care by specialist   . Goals of care, counseling/discussion   . Acute blood loss as cause of postoperative anemia 09/03/2017  . Closed left hip fracture (HCC) 09/02/2017  . Left hip pain 09/02/2017  . Osteoarthritis 08/28/2016  . Presence of permanent cardiac pacemaker 05/27/2016  . Essential hypertension, benign 09/07/2015  . Arthritis 05/03/2015  . Dementia without behavioral disturbance 04/10/2015  . Parkinsonian features 04/10/2015  . Depression due to dementia 04/10/2015  . Anemia 04/06/2015  . Hyperglycemia 04/06/2015  . Sick sinus syndrome (HCC) 05/06/2013    Past Surgical History:  Procedure Laterality Date  . ATRIAL CARDIAC PACEMAKER INSERTION  2012  . BRAIN SURGERY  2013  . CARPAL TUNNEL RELEASE Bilateral   . FLEXIBLE SIGMOIDOSCOPY N/A 09/12/2014   Procedure: FLEXIBLE SIGMOIDOSCOPY;  Surgeon: Beverley Fiedler, MD;  Location: [facility];  Service: Gastroenterology;  Laterality: N/A;  . HIP ARTHROPLASTY Left 09/02/2017   Procedure: ARTHROPLASTY BIPOLAR HIP (HEMIARTHROPLASTY);  Surgeon: Durene Romans, MD;   Location: [facility];  Service: Orthopedics;  Laterality: Left;  . KNEE ARTHROPLASTY Bilateral      OB History   None      Home Medications    Prior to Admission medications   Medication Sig Start Date End Date Taking? Authorizing Provider  acetaminophen (TYLENOL) 325 MG tablet Take 650 mg by mouth every 6 (six) hours.  08/28/16   Pecola Lawless, MD  aspirin 325 MG tablet Take 1 tablet (325 mg total) by mouth daily. 09/08/17   Richarda Overlie, MD  baclofen (LIORESAL) 10 MG tablet Take 5 mg by mouth as needed for muscle spasms.    [provider]  bisacodyl (DULCOLAX) 10 MG suppository Place 1 suppository (10 mg total) rectally daily as needed for moderate constipation. 09/08/17   Richarda Overlie, MD  cholecalciferol (VITAMIN D) 1000 UNITS tablet Take 1 tablet (1,000 Units total) by mouth daily. 03/30/15   Sharon Seller, NP  docusate (COLACE) 50 MG/5ML liquid Take 10 mLs (100 mg total) by mouth 2 (two) times daily. 09/08/17   Richarda Overlie, MD  ENSURE (ENSURE) Take 237 mLs by mouth 3 (three) times daily between meals.    [provider]  Melatonin 3 MG TABS Take 3 mg by mouth at bedtime.     [provider]  Menthol, Topical Analgesic, (BIOFREEZE) 4 % GEL Apply 1 application topically every 8 (eight) hours as needed (muscle pain).    [provider]  polyethylene glycol (MIRALAX / GLYCOLAX) packet Take 17 g by  mouth 2 (two) times daily. 09/08/17   Richarda Overlie, MD  sertraline (ZOLOFT) 100 MG tablet Take 125 mg by mouth daily.     [provider]  traMADol (ULTRAM) 50 MG tablet Take 0.5 tablets (25 mg total) by mouth 2 (two) times daily. 09/08/17   Richarda Overlie, MD    Family History Family History  Problem Relation Age of Onset  . Heart disease Son   . Cancer Son 60       on Liver  . Alzheimer's disease Mother   . Alzheimer's disease Father   . Heart attack Son   . Colon polyps Neg Hx   . Diabetes Neg Hx   . Kidney disease Neg Hx      Social History Social History   Tobacco Use  . Smoking status: Never Smoker  . Smokeless tobacco: Never Used  Substance Use Topics  . Alcohol use: No    Alcohol/week: 0.0 oz  . Drug use: No     Allergies   Ativan [lorazepam] and Benzodiazepines   Review of Systems Review of Systems  Unable to perform ROS: Dementia     Physical Exam Updated Vital Signs BP (!) 120/93 (BP Location: Right Arm)   Pulse (!) 105   Temp 100.3 F (37.9 C) (Rectal)   Resp 20   SpO2 94%    Patient Vitals for the past 24 hrs:  BP Temp Temp src Pulse Resp SpO2  09/26/17 1330 117/76 - - 97 10 100 %  09/26/17 1300 107/65 - - 91 18 93 %  09/26/17 1230 110/70 - - 99 17 95 %  09/26/17 1200 103/64 - - (!) 102 14 96 %  09/26/17 1143 101/65 - - (!) 106 17 100 %  09/26/17 1057 (!) 120/93 100.3 F (37.9 C) Rectal (!) 105 20 94 %     Physical Exam 1105: Physical examination:  Nursing notes reviewed; Vital signs and O2 SAT reviewed;  Constitutional: Thin, frail. In no acute distress; Head:  Normocephalic, atraumatic; Eyes: EOMI, PERRL, No scleral icterus; ENMT: Mouth and pharynx normal, Mucous membranes dry; Neck: Supple, Full range of motion, No lymphadenopathy; Cardiovascular: Tachycardic rate and rhythm, No gallop; Respiratory: Breath sounds clear & equal bilaterally, No wheezes. Normal respiratory effort/excursion; Chest: Nontender, Movement normal; Abdomen: Soft, Nontender, Nondistended, Normal bowel sounds; Genitourinary: No CVA tenderness; Extremities: Peripheral pulses normal, +knee immobilizer left. No deformity, No edema, No calf edema or asymmetry.; Neuro: Awake, alert, eyes open. Moaning. Moves all extremities spontaneously on stretcher..; Skin: Color normal, Warm, Dry.    ED Treatments / Results  Labs (all labs ordered are listed, but only abnormal results are displayed)   EKG EKG Interpretation  Date/Time:  Friday Sep 26 2017 10:56:16 EDT Ventricular Rate:  107 PR Interval:     QRS Duration: 101 QT Interval:  369 QTC Calculation: 493 R Axis:   -171 Text Interpretation:  Sinus tachycardia Ventricular premature complex Probable left atrial enlargement Low voltage, extremity leads Probable RVH w/ secondary repol abnormality Borderline prolonged QT interval When compared with ECG of 09/02/2017 Rate faster Otherwise no significant change Confirmed by Samuel Jester [phone]) on 09/26/2017 11:45:54 AM   Radiology   Procedures Procedures (including critical care time)  Medications Ordered in ED Medications - No data to display   Initial Impression / Assessment and Plan / ED Course  I have reviewed the triage vital signs and the nursing notes.  Pertinent labs & imaging results that were available during my care of the  patient were reviewed by me and considered in my medical decision making (see chart for details).  MDM Reviewed: previous chart, nursing note and vitals Reviewed previous: labs and ECG Interpretation: labs, ECG, x-ray and CT scan   Results for orders placed or performed during the hospital encounter of 09/26/17  Urinalysis, Routine w reflex microscopic  Result Value Ref Range   Color, Urine AMBER (A) YELLOW   APPearance CLOUDY (A) CLEAR   Specific Gravity, Urine 1.023 1.005 - 1.030   pH 5.0 5.0 - 8.0   Glucose, UA NEGATIVE NEGATIVE mg/dL   Hgb urine dipstick MODERATE (A) NEGATIVE   Bilirubin Urine SMALL (A) NEGATIVE   Ketones, ur 5 (A) NEGATIVE mg/dL   Protein, ur 657 (A) NEGATIVE mg/dL   Nitrite NEGATIVE NEGATIVE   Leukocytes, UA NEGATIVE NEGATIVE   WBC, UA 6-10 0 - 5 WBC/hpf   Bacteria, UA FEW (A) NONE SEEN   Squamous Epithelial / LPF 6-10 0 - 5   Mucus PRESENT    Hyaline Casts, UA PRESENT   Comprehensive metabolic panel  Result Value Ref Range   Sodium 154 (H) 135 - 145 mmol/L   Potassium 3.7 3.5 - 5.1 mmol/L   Chloride 115 (H) 101 - 111 mmol/L   CO2 24 22 - 32 mmol/L   Glucose, Bld 126 (H) 65 - 99 mg/dL   BUN 35 (H) 6 - 20  mg/dL   Creatinine, Ser 8.46 (H) 0.44 - 1.00 mg/dL   Calcium 8.6 (L) 8.9 - 10.3 mg/dL   Total Protein 7.5 6.5 - 8.1 g/dL   Albumin 2.8 (L) 3.5 - 5.0 g/dL   AST 20 15 - 41 U/L   ALT 7 (L) 14 - 54 U/L   Alkaline Phosphatase 106 38 - 126 U/L   Total Bilirubin 0.7 0.3 - 1.2 mg/dL   GFR calc non Af Amer 36 (L) >60 mL/min   GFR calc Af Amer 42 (L) >60 mL/min   Anion gap 15 5 - 15  Troponin I  Result Value Ref Range   Troponin I 0.11 (HH) <0.03 ng/mL  CBC with Differential  Result Value Ref Range   WBC 10.5 4.0 - 10.5 K/uL   RBC 3.60 (L) 3.87 - 5.11 MIL/uL   Hemoglobin 10.8 (L) 12.0 - 15.0 g/dL   HCT 96.2 (L) 95.2 - 84.1 %   MCV 98.1 78.0 - 100.0 fL   MCH 30.0 26.0 - 34.0 pg   MCHC 30.6 30.0 - 36.0 g/dL   RDW 32.4 (H) 40.1 - 02.7 %   Platelets 237 150 - 400 K/uL   Neutrophils Relative % 80 %   Neutro Abs 8.5 (H) 1.7 - 7.7 K/uL   Lymphocytes Relative 14 %   Lymphs Abs 1.5 0.7 - 4.0 K/uL   Monocytes Relative 5 %   Monocytes Absolute 0.5 0.1 - 1.0 K/uL   Eosinophils Relative 1 %   Eosinophils Absolute 0.1 0.0 - 0.7 K/uL   Basophils Relative 0 %   Basophils Absolute 0.0 0.0 - 0.1 K/uL  Protime-INR  Result Value Ref Range   Prothrombin Time 15.5 (H) 11.4 - 15.2 seconds   INR 1.24   I-Stat CG4 Lactic Acid, ED  Result Value Ref Range   Lactic Acid, Venous 1.59 0.5 - 1.9 mmol/L   Dg Chest 1 View Result Date: 09/26/2017 CLINICAL DATA:  Altered mental status. EXAM: CHEST  1 VIEW COMPARISON:  Chest x-ray dated September 02, 2017. FINDINGS: Unchanged left chest wall pacemaker. The heart  size and mediastinal contours are within normal limits. Normal pulmonary vascularity. No focal consolidation, pleural effusion, or pneumothorax. Unchanged elevation of the right hemidiaphragm. IMPRESSION: No active disease. Electronically Signed   By: Obie Dredge M.D.   On: 09/26/2017 12:19    Ct Head Wo Contrast Result Date: 09/26/2017 CLINICAL DATA:  Altered level of consciousness EXAM: CT HEAD WITHOUT  CONTRAST TECHNIQUE: Contiguous axial images were obtained from the base of the skull through the vertex without intravenous contrast. The examination is significantly limited by patient motion artifact. COMPARISON:  04/10/2017 FINDINGS: Brain: Chronic atrophic and white matter ischemic changes are seen similar to that noted on the prior exam. No findings to suggest acute hemorrhage, acute infarction or space-occupying mass lesion are noted. Vascular: No hyperdense vessel or unexpected calcification. Skull: Normal. Negative for fracture or focal lesion. Sinuses/Orbits: No acute finding. Other: None. IMPRESSION: Somewhat limited exam due to patient motion. Chronic atrophic and ischemic changes without acute abnormality. Electronically Signed   By: Alcide Clever M.D.   On: 09/26/2017 13:14    Results for MARVELLE, SPAN (MRN [id number]) as of 09/26/2017 13:53  Ref. Range 09/02/2017 05:10 09/26/2017 11:39  Troponin I Latest Ref Range: <0.03 ng/mL 0.04 (HH) 0.11 [facility])   Results for TAMANNA, WHITSON (MRN [id number]) as of 09/26/2017 13:53  Ref. Range 09/03/2017 03:41 09/04/2017 05:20 09/05/2017 05:36 09/26/2017 11:39  BUN Latest Ref Range: 6 - 20 mg/dL 32 (H) 26 (H) 20 35 (H)  Creatinine Latest Ref Range: 0.44 - 1.00 mg/dL 0.86 5.78 4.69 6.29 (H)     1415:  Pt with new hypernatremia, mild BUN/Cr elevation, and BP soft; judicious IVF given. Temp in ED to 100.3, WBC count and lactic acid normal; BC and UC obtained and pending. H/H per baseline. Troponin mildly elevated from previous, but EKG without acute STTW changes. Pt has stooled multiple times while in the ED, per ED Tech; stool studies obtained. Will admit. T/C returned from [facility]. Mahala Menghini, case discussed, including:  HPI, pertinent PM/SHx, VS/PE, dx testing, ED course and treatment:  Agreeable to admit.      Final Clinical Impressions(s) / ED Diagnoses   Final diagnoses:  None    ED Discharge Orders    None       Samuel Jester,  DO 09/28/17 1309

## 2017-09-27 DIAGNOSIS — N179 Acute kidney failure, unspecified: Secondary | ICD-10-CM

## 2017-09-27 DIAGNOSIS — Z7189 Other specified counseling: Secondary | ICD-10-CM

## 2017-09-27 DIAGNOSIS — A419 Sepsis, unspecified organism: Principal | ICD-10-CM

## 2017-09-27 DIAGNOSIS — L8990 Pressure ulcer of unspecified site, unspecified stage: Secondary | ICD-10-CM

## 2017-09-27 DIAGNOSIS — Z515 Encounter for palliative care: Secondary | ICD-10-CM

## 2017-09-27 LAB — CBC WITH DIFFERENTIAL/PLATELET
BASOS PCT: 0 %
Basophils Absolute: 0 10*3/uL (ref 0.0–0.1)
Eosinophils Absolute: 0.3 10*3/uL (ref 0.0–0.7)
Eosinophils Relative: 2 %
HEMATOCRIT: 40.2 % (ref 36.0–46.0)
HEMOGLOBIN: 12.2 g/dL (ref 12.0–15.0)
LYMPHS ABS: 1.3 10*3/uL (ref 0.7–4.0)
LYMPHS PCT: 12 %
MCH: 29.5 pg (ref 26.0–34.0)
MCHC: 30.3 g/dL (ref 30.0–36.0)
MCV: 97.1 fL (ref 78.0–100.0)
Monocytes Absolute: 0.3 10*3/uL (ref 0.1–1.0)
Monocytes Relative: 3 %
NEUTROS ABS: 9.1 10*3/uL — AB (ref 1.7–7.7)
Neutrophils Relative %: 83 %
Platelets: 215 10*3/uL (ref 150–400)
RBC: 4.14 MIL/uL (ref 3.87–5.11)
RDW: 15.7 % — ABNORMAL HIGH (ref 11.5–15.5)
WBC: 11 10*3/uL — ABNORMAL HIGH (ref 4.0–10.5)

## 2017-09-27 LAB — GASTROINTESTINAL PANEL BY PCR, STOOL (REPLACES STOOL CULTURE)
ASTROVIRUS: NOT DETECTED
Adenovirus F40/41: NOT DETECTED
CAMPYLOBACTER SPECIES: NOT DETECTED
CYCLOSPORA CAYETANENSIS: NOT DETECTED
Cryptosporidium: NOT DETECTED
ENTEROAGGREGATIVE E COLI (EAEC): NOT DETECTED
ENTEROPATHOGENIC E COLI (EPEC): NOT DETECTED
Entamoeba histolytica: NOT DETECTED
Enterotoxigenic E coli (ETEC): NOT DETECTED
Giardia lamblia: NOT DETECTED
NOROVIRUS GI/GII: NOT DETECTED
PLESIMONAS SHIGELLOIDES: NOT DETECTED
Rotavirus A: NOT DETECTED
SALMONELLA SPECIES: NOT DETECTED
SAPOVIRUS (I, II, IV, AND V): NOT DETECTED
SHIGA LIKE TOXIN PRODUCING E COLI (STEC): NOT DETECTED
SHIGELLA/ENTEROINVASIVE E COLI (EIEC): NOT DETECTED
VIBRIO CHOLERAE: NOT DETECTED
VIBRIO SPECIES: NOT DETECTED
Yersinia enterocolitica: NOT DETECTED

## 2017-09-27 LAB — URINE CULTURE

## 2017-09-27 LAB — COMPREHENSIVE METABOLIC PANEL
ALK PHOS: 112 U/L (ref 38–126)
ALT: 10 U/L — AB (ref 14–54)
AST: 27 U/L (ref 15–41)
Albumin: 3 g/dL — ABNORMAL LOW (ref 3.5–5.0)
Anion gap: 16 — ABNORMAL HIGH (ref 5–15)
BUN: 29 mg/dL — AB (ref 6–20)
CALCIUM: 8.9 mg/dL (ref 8.9–10.3)
CHLORIDE: 108 mmol/L (ref 101–111)
CO2: 25 mmol/L (ref 22–32)
CREATININE: 0.96 mg/dL (ref 0.44–1.00)
GFR calc Af Amer: 57 mL/min — ABNORMAL LOW (ref >60)
GFR calc non Af Amer: 49 mL/min — ABNORMAL LOW (ref >60)
GLUCOSE: 156 mg/dL — AB (ref 65–99)
Potassium: 3.3 mmol/L — ABNORMAL LOW (ref 3.5–5.1)
SODIUM: 149 mmol/L — AB (ref 135–145)
Total Bilirubin: 0.8 mg/dL (ref 0.3–1.2)
Total Protein: 8 g/dL (ref 6.5–8.1)

## 2017-09-27 LAB — PROTIME-INR
INR: 1.23
Prothrombin Time: 15.4 seconds — ABNORMAL HIGH (ref 11.4–15.2)

## 2017-09-27 LAB — MAGNESIUM: Magnesium: 1.7 mg/dL (ref 1.7–2.4)

## 2017-09-27 LAB — PHOSPHORUS: PHOSPHORUS: 2.2 mg/dL — AB (ref 2.5–4.6)

## 2017-09-27 MED ORDER — POTASSIUM PHOSPHATES 15 MMOLE/5ML IV SOLN
10.0000 mmol | Freq: Once | INTRAVENOUS | Status: AC
  Administered 2017-09-27: 10 mmol via INTRAVENOUS
  Filled 2017-09-27: qty 3.33

## 2017-09-27 MED ORDER — POTASSIUM CHLORIDE 10 MEQ/100ML IV SOLN
10.0000 meq | INTRAVENOUS | Status: DC
  Filled 2017-09-27: qty 100

## 2017-09-27 MED ORDER — POTASSIUM CHLORIDE 10 MEQ/100ML IV SOLN
10.0000 meq | INTRAVENOUS | Status: AC
  Administered 2017-09-27 (×4): 10 meq via INTRAVENOUS
  Filled 2017-09-27 (×3): qty 100

## 2017-09-27 MED ORDER — DEXTROSE 5 % AND 0.45 % NACL IV BOLUS
500.0000 mL | Freq: Once | INTRAVENOUS | Status: AC
  Administered 2017-09-27: 500 mL via INTRAVENOUS

## 2017-09-27 MED ORDER — DEXTROSE 5 % IV SOLN
10.0000 mmol | Freq: Once | INTRAVENOUS | Status: DC
  Filled 2017-09-27: qty 3.33

## 2017-09-27 NOTE — Consult Note (Signed)
Consultation Note Date: 09/27/2017   Patient Name: Sherri Leonard  DOB: 1921-05-02  MRN: [id number]  Age / Sex: 84 y.o., female  PCP: Sherron Monday, MD Referring Physician: Merlene Laughter, DO  Reason for Consultation: Establishing goals of care  HPI/Patient Profile: 84 y.o. female  with past medical history of dementia, osteoarthritis, recent hip fracture and pacemaker admitted on 09/26/2017 with dehydration, altered mental status, AKI, hypernatremia, and concern for infection.  Palliative consulted for goals of care  Clinical Assessment and Goals of Care: I have reviewed medical records, discussed with care team, and called and discussed with patient's granddaughter Rhae Hammock) regarding diagnosis, prognosis, GOC, EOL wishes, disposition and options.  Introduced Palliative Medicine as specialized medical care for people living with serious illness. It focuses on providing relief from the symptoms and stress of a serious illness. The goal is to improve quality of life for both the patient and the family.  Israel recalls meeting with member of our team last admission.  Armina reports understanding that her grandmother continues to have decline in her nutrition, cognition, and functional status with a steep decline following hip fracture.  She states that she talked with the admitting physician about a plan to continue with hydration and antibiotics for 3 days (through the weekend) to see if she has any meaningful recovery but understands concern that we may be looking at end of life if she does not improve with current interventions.  Briefly discussed palliative versus hospice options at facility if she does not show clinical improvement.   Questions and concerns were addressed. PMT contact information given.     SUMMARY OF RECOMMENDATIONS    Patient's granddaughter/healthcare power of attorney  discussed plan for trial of antibiotics and fluids through the weekend with admitting physician.  She is invested in this plan.  PMT will follow.  Israel will call on Monday morning with the time that she can meet for a family meeting on Monday after 3-day trial of fluids and antibiotics is complete.  Code Status/Advance Care Planning:  DNR/DNI  Palliative Prophylaxis:   Aspiration, Delirium Protocol, Frequent Pain Assessment, Oral Care and Turn Reposition  Psycho-social/Spiritual:   Desire for further Chaplaincy support:yes  Additional Recommendations: Caregiving  Support/Resources and Education on Hospice  Prognosis:   Guarded with hip fracture s/p ORIF, underlying dementia, and continued decline in functional/cognitive/nutritional status.   Discharge Planning: To Be Determined      Primary Diagnoses: Present on Admission: . Acute kidney injury (HCC)   I have reviewed the medical record, interviewed the patient and family, and examined the patient. The following aspects are pertinent.  Past Medical History:  Diagnosis Date  . Dementia   . OA (osteoarthritis)   . Pacemaker    Social History   Socioeconomic History  . Marital status: Widowed    Spouse name: Not on file  . Number of children: 1  . Years of education: Not on file  . Highest education level: Not on file  Occupational History  . Occupation: retired  Social Needs  . Financial resource strain: Not on file  . Food insecurity:    Worry: Not on file    Inability: Not on file  . Transportation needs:    Medical: Not on file    Non-medical: Not on file  Tobacco Use  . Smoking status: Never Smoker  . Smokeless tobacco: Never Used  Substance and Sexual Activity  . Alcohol use: No    Alcohol/week: 0.0 oz  . Drug use: No  . Sexual activity: Never  Lifestyle  . Physical activity:    Days per week: Not on file    Minutes per session: Not on file  . Stress: Not on file  Relationships  . Social  connections:    Talks on phone: Not on file    Gets together: Not on file    Attends religious service: Not on file    Active member of club or organization: Not on file    Attends meetings of clubs or organizations: Not on file    Relationship status: Not on file  Other Topics Concern  . Not on file  Social History Narrative   Diet:   Do you drink/eat things with caffeine? No   Marital status:     Widowed                         What year were you married?   Do you live in a house, apartment, assisted living, condo, trailer, etc)? Home   Is it one or more stories? One   How many persons live in your home? Two   Do you have any pets in your home? No   Current or past profession:   Do you exercise?      No                                              Type & how often:   Do you have a living will? Yes   Do you have a DNR Form? Yes   Do you have a POA/HPOA forms? Yes   Family History  Problem Relation Age of Onset  . Heart disease Son   . Cancer Son 60       on Liver  . Alzheimer's disease Mother   . Alzheimer's disease Father   . Heart attack Son   . Colon polyps Neg Hx   . Diabetes Neg Hx   . Kidney disease Neg Hx    Scheduled Meds: . enoxaparin (LOVENOX) injection  30 mg Subcutaneous Q24H   Continuous Infusions: . ceFEPime (MAXIPIME) IV Stopped (09/27/17 1758)  . dextrose Stopped (09/27/17 1728)  . potassium chloride 10 mEq (09/27/17 2022)  . potassium PHOSPHATE IVPB (mmol)     PRN Meds:.acetaminophen **OR** acetaminophen Medications Prior to Admission:  Prior to Admission medications   Medication Sig Start Date End Date Taking? Authorizing Provider  acetaminophen (TYLENOL) 325 MG tablet Take 650 mg by mouth every 6 (six) hours.  08/28/16  Yes Pecola Lawless, MD  baclofen (LIORESAL) 10 MG tablet Take 5 mg by mouth as needed for muscle spasms.   Yes [provider]  cholecalciferol (VITAMIN D) 1000 UNITS tablet Take 1 tablet (1,000 Units total) by mouth  daily. 03/30/15  Yes Sharon Seller, NP  ENSURE (ENSURE) Take 237 mLs by mouth 3 (  three) times daily between meals.   Yes [provider]  Melatonin 3 MG TABS Take 3 mg by mouth at bedtime.    Yes [provider]  Menthol, Topical Analgesic, (BIOFREEZE) 4 % GEL Apply 1 application topically every 8 (eight) hours as needed (muscle pain).   Yes [provider]  sertraline (ZOLOFT) 100 MG tablet Take 125 mg by mouth daily.    Yes [provider]  traMADol (ULTRAM) 50 MG tablet Take 25 mg by mouth 2 (two) times daily.   Yes [provider]   Allergies  Allergen Reactions  . Lorazepam Other (See Comments)    Causes violence  Other reaction(s): Agitation  . Benzodiazepines    Review of Systems  Unable to perform ROS: Dementia   General: Sleeping.  Arouses, but only mumbles and immediately falls back to sleep HEENT: No bruits, no goiter, no JVD Heart: Regular rate and rhythm. No murmur appreciated. Lungs: Good air movement, clear Abdomen: Soft, nontender, nondistended, positive bowel sounds.  Ext: No significant edema Skin: Warm and dry Neuro: Grossly intact, nonfocal.   Vital Signs: BP (!) 121/95   Pulse (!) 104   Temp (!) 97.4 F (36.3 C) (Oral)   Resp 14   Ht  (1.651 m)   Wt 51.6 kg (113 lb 12.8 oz)   SpO2 94%   BMI 18.94 kg/m  Pain Scale: PAINAD   Pain Score: 2    SpO2: SpO2: 94 % O2 Device:SpO2: 94 % O2 Flow Rate: .   IO: Intake/output summary:   Intake/Output Summary (Last 24 hours) at 09/27/2017 2055 Last data filed at 09/27/2017 1845 Gross per 24 hour  Intake 789.17 ml  Output -  Net 789.17 ml    LBM: Last BM Date: 09/27/17 Baseline Weight: Weight: 56.7 kg (125 lb) Most recent weight: Weight: 51.6 kg (113 lb 12.8 oz)     Palliative Assessment/Data: PPS 30%   Flowsheet Rows     Most Recent Value  Intake Tab  Referral Department  Hospitalist  Unit at Time of Referral  Med/Surg Unit  Palliative Care  Primary Diagnosis  Sepsis/Infectious Disease  Date Notified  09/26/17  Palliative Care Type  Return patient Palliative Care  Reason for referral  Clarify Goals of Care  Date of Admission  09/26/17  Date first seen by Palliative Care  09/27/17  # of days Palliative referral response time  1 Day(s)  # of days IP prior to Palliative referral  0  Clinical Assessment  Palliative Performance Scale Score  30%  Psychosocial & Spiritual Assessment  Palliative Care Outcomes  Patient/Family meeting held?  Yes  Who was at the meeting?  granddaughter  Palliative Care Outcomes  Clarified goals of care      Time Total: Greater than 50%  of this time was spent counseling and coordinating care related to the above assessment and plan.  Signed by:  Romie Minus, MD [facility] Team [phone]    Please contact Palliative Medicine Team phone at [phone] for questions and concerns.  For individual provider: See Loretha Stapler

## 2017-09-27 NOTE — Evaluation (Signed)
Clinical/Bedside Swallow Evaluation Patient Details  Name: Sherri Leonard MRN: [id number] Date of Birth: [birth year]  Today's Date: 09/27/2017 Time: SLP Start Time (ACUTE ONLY): 0850 SLP Stop Time (ACUTE ONLY): 0912 SLP Time Calculation (min) (ACUTE ONLY): 22 min  Past Medical History:  Past Medical History:  Diagnosis Date  . Dementia   . OA (osteoarthritis)   . Pacemaker    Past Surgical History:  Past Surgical History:  Procedure Laterality Date  . ATRIAL CARDIAC PACEMAKER INSERTION  2012  . BRAIN SURGERY  2013  . CARPAL TUNNEL RELEASE Bilateral   . FLEXIBLE SIGMOIDOSCOPY N/A 09/12/2014   Procedure: FLEXIBLE SIGMOIDOSCOPY;  Surgeon: Beverley Fiedler, MD;  Location: [facility];  Service: Gastroenterology;  Laterality: N/A;  . HIP ARTHROPLASTY Left 09/02/2017   Procedure: ARTHROPLASTY BIPOLAR HIP (HEMIARTHROPLASTY);  Surgeon: Durene Romans, MD;  Location: [facility];  Service: Orthopedics;  Laterality: Left;  . KNEE ARTHROPLASTY Bilateral    HPI:  Sick sinus syndrome with pacemaker possible Parkinson's bipolar anemia osteoarthritis, advanced dementia; admitted 4/16-4/22 with L hip fracture and was placed on dysphagia 1 diet on that admission due to likely cognitive-based dysphagia. Reportedly pt has further declined since hip fracture. CXR was negative, head CT negative for acute findings. Bedside swallow eval ordered.   Assessment / Plan / Recommendation Clinical Impression  Pt with suspected significant aspiration following trial of thin liquid- immediate and prolonged coughing/ wet vocal quality, moaning. SLP repeatedly cued pt to continue coughing as pt continued to have wet/ gurgling vocal quality; however this was difficult as pt has difficulty following commands. Prior to PO trial pt appeared to have xerostomia and some dried secretions; SLP provided oral care with suction which resulted in some gum bleeding; RN informed. Given these findings and cognitive status, recommend that pt  remain NPO for now with meds via alternative means, provide frequent oral care. SLP will f/u to repeat PO trials.  SLP Visit Diagnosis: Dysphagia, unspecified (R13.10)    Aspiration Risk  Severe aspiration risk    Diet Recommendation NPO   Medication Administration: Via alternative means    Other  Recommendations Oral Care Recommendations: Oral care QID Other Recommendations: Have oral suction available   Follow up Recommendations Skilled Nursing facility      Frequency and Duration min 2x/week  2 weeks       Prognosis Prognosis for Safe Diet Advancement: Fair Barriers to Reach Goals: Cognitive deficits      Swallow Study   General HPI: Sick sinus syndrome with pacemaker possible Parkinson's bipolar anemia osteoarthritis, advanced dementia; admitted 4/16-4/22 with L hip fracture and was placed on dysphagia 1 diet on that admission due to likely cognitive-based dysphagia. Reportedly pt has further declined since hip fracture. CXR was negative, head CT negative for acute findings. Bedside swallow eval ordered. Type of Study: Bedside Swallow Evaluation Previous Swallow Assessment: See HPI Diet Prior to this Study: NPO Temperature Spikes Noted: No Respiratory Status: Room air History of Recent Intubation: No Behavior/Cognition: Cooperative;Requires cueing Oral Cavity Assessment: Dried secretions Oral Care Completed by SLP: Yes Oral Cavity - Dentition: Poor condition Self-Feeding Abilities: Needs assist Patient Positioning: Upright in bed Baseline Vocal Quality: Wet Volitional Cough: Cognitively unable to elicit Volitional Swallow: Unable to elicit    Oral/Motor/Sensory Function Overall Oral Motor/Sensory Function: Other (comment)(Unable to assess d/t cognition)   Ice Chips Ice chips: Not tested   Thin Liquid Thin Liquid: Impaired Presentation: Straw Pharyngeal  Phase Impairments: Multiple swallows;Wet Vocal Quality;Cough - Immediate    Nectar  Thick Nectar Thick Liquid:  Not tested   Honey Thick Honey Thick Liquid: Not tested   Puree Puree: Not tested   Solid   GO   Solid: Not tested        Metro Kung, MA, CCC-SLP 09/27/2017,9:21 AM  [phone]

## 2017-09-27 NOTE — Progress Notes (Signed)
Patient has had no urine output for 8 hrs. Bladder scan showed 219cc. On call made aware and new orders given to increase patient's IV fluids

## 2017-09-27 NOTE — Progress Notes (Signed)
PROGRESS NOTE    Sherri Leonard  ZOX:[id number] DOB: 1920/12/22 DOA: 09/26/2017 PCP: Sherron Monday, MD   Brief Narrative:  The patient is a 84 year old female from SNF with a PMH of  Sick sinus syndrome with pacemaker, possible Parkinson's, bipolar disorder, anemia, Osteoarthritis, and other comorbidities who was recently Admitted 4/16-4/22 for a Left Hip fracture and at that admission placed on dysphagia 1 diet at that time.   She returns from [facility] nursing facility after having been rehabilitated there and was found to be floridly septic and dehydrated-she had a T-max of 100 was unable to provide any meaningful history. Dr. Mahala Menghini discussed the case with her her [facility] POA Armina who is the grand Granddaughter and her CODE Status was changed to DNR. She was admitted for Sepsis and Dehydration and Palliative Care has been consulted.   Assessment & Plan:   Active Problems:   Acute kidney injury (HCC)  Sepsis 2/2 unclear Etiology with Possible Wound Infection vs. Other Etiology; work-up currently in process -Upon admission patient was found to have a low temperature, was tachycardic, tachypneic, iron depleted and a suspected source of infection of the sacral wound -Given a D5 W/half normal saline bolus of 500 and mils and then started on D5W  at 75 mL's per hour -WBC went from 10.5 and is now 11.0 -Lactic acid level is 1.59 -Cultures x2 showed no growth at less than 24 hours -C. Dfficile was negative along with fecal occult blood -Chest x-ray showed no active disease -Urinalysis showed cloudy urine appearance with moderate hemoglobin, negative leukocytes, negative nitrites, few bacteria, 6-10 WBCs; Urine culture less than 10,000 colonies of insignificant growth -GI pathogen panel is pending patient was having significant diarrhea on admission -Continue with IV fluid as below -Palliative care has been consulted for further goals of care  AKI, improving  -Wheezing.   BUN/creatinine went from 35/1.23 and is now 29/0.96 -Continue with IV fluid hydration with D5W at 75 mL/hr; Was given a D5W 1/2 NS 500 mL Bolus -Avoid nephrotoxic medications if possible -Continue to monitor and repeat CMP in a.m.  Hypernatremia -Likely from dehydration.  Sodium level on admission was 154 -Sodium is now improved to 149 after IV fluid hydration -Continue IV fluid as above Continue to monitor and repeat CMP in a.m.  Hypokalemia -Patient's potassium this morning was 3.3 -Replete with IV KCl 40 mEq along with 10 mmol of IV K-Phos -Continue to monitor and replete as necessary -Repeat CMP in a.m.  Hypophosphatemia -Patient's phosphorus level this morning was 2.2 -Replete with IV 10 mmol of K-Phos -Continue to monitor and replete as necessary -Repeat phosphorus level in a.m.  Mild Leukocytosis -Patient's WBC went from 10.5 is now 11.0 -Currently on empiric antibiotics with IV cefepime 2 g every 24 hours for suspected wound infection -Continue to monitor for signs and symptoms of infection -Repeat CBC with differential in a.m.  Toxic Metabolic Encephalopathy  -Likely 2/2 to polypharmacy and sepsis -CT head was somewhat limited exam due to patient motion but showed chronic atrophic and ischemic changes without acute abnormality. -SLP ordered and they are recommending strict n.p.o. at this time -Family is n.p.o. and all her p.o. medications have been held including the baclofen 5 mg as needed for muscle spasms, sertraline 125 mg p.o. daily, tramadol 25 mg p.o. twice daily  Type 2 NSTEMI -Troponin mildly elevated at 0.11 in the setting of AKI and suspected infection and it would not change management in someone with Aementia and Advanced Age  Chronic bundle branch block with sick sinus syndrome -stable at this time  Bedsore/Sacral Wound poA  -WOC Consult -We will need turning every 2 hourly as possible  ? Mouth sore -Oral Care Per Nursing  DVT prophylaxis:  SCDs; Enoxaparin 30 mcg sq q24h Code Status: DO NOT RESUSCITATE  Family Communication: No family present at bedside Disposition Plan: Return go SNF vs. Hospice  Consultants:  Palliative Care Medicine   Procedures: None   Antimicrobials:  Anti-infectives (From admission, onward)   Start     Dose/Rate Route Frequency Ordered Stop   09/27/17 1600  ceFEPIme (MAXIPIME) 2 g in sodium chloride 0.9 % 100 mL IVPB     2 g 200 mL/hr over 30 Minutes Intravenous Every 24 hours 09/26/17 1514     09/26/17 1515  ceFEPIme (MAXIPIME) 2 g in sodium chloride 0.9 % 100 mL IVPB     2 g 200 mL/hr over 30 Minutes Intravenous STAT 09/26/17 1507 09/26/17 1549     Subjective: And examined at bedside and she is continuously moaning and groaning and unable to provide a subjective history.  Speech therapy evaluated her and have made her n.p.o. Palliative care to still see the patient.  No family at bedside.  Objective: Vitals:   09/26/17 1805 09/26/17 1841 09/26/17 2220 09/27/17 0555  BP: 118/68 (!) 143/98 120/76 (!) 146/117  Pulse: 85 80 88 94  Resp: 18 (!) Temp:  (!) 96.7 F (35.9 C) 97.6 F (36.4 C) 97.6 F (36.4 C)  TempSrc:  Axillary Oral   SpO2: 100% 92% 99% 93%  Weight:  51.6 kg (113 lb 12.8 oz)    Height:   (1.651 m)      Intake/Output Summary (Last 24 hours) at 09/27/2017 0820 Last data filed at 09/27/2017 0600 Gross per 24 hour  Intake 1203.34 ml  Output -  Net 1203.34 ml   Filed Weights   09/26/17 1501 09/26/17 1841  Weight: 56.7 kg (125 lb) 51.6 kg (113 lb 12.8 oz)   Examination: Physical Exam:  Constitutional: Thin chronically ill appearing elderly AAF who appears uncomfortable and unable to provide a subjective History Eyes: Lids and conjunctivae normal, sclerae anicteric  ENMT: External Ears, Nose appear normal.  Neck: Appears normal, supple, no cervical masses, normal ROM, no appreciable thyromegaly, no JVD Respiratory: Diminished to auscultation bilaterally,  no wheezing, rales, rhonchi or crackles. Normal respiratory effort and patient is not tachypenic. No accessory muscle use.  Cardiovascular: RRR, no murmurs / rubs / gallops. S1 and S2 auscultated. Trace extremity edema.  Abdomen: Soft, non-tender, non-distended. No masses palpated. No appreciable hepatosplenomegaly. Bowel sounds positive x4.  GU: Deferred. Musculoskeletal: Left Leg immobilized from Hip Fx Skin: Has a Bedsore/Sacral Wound No induration; Warm and dry.  Neurologic: Limited Neuro Examination due to patient participation. Continuously moaning.  Psychiatric: Impaired judgment and insight. Anxious appearing mood.   Data Reviewed: I have personally reviewed following labs and imaging studies  CBC: Recent Labs  Lab 09/26/17 1139  WBC 10.5  NEUTROABS 8.5*  HGB 10.8*  HCT 35.3*  MCV 98.1  PLT 237   Basic Metabolic Panel: Recent Labs  Lab 09/26/17 1139  NA 154*  K 3.7  CL 115*  CO2 24  GLUCOSE 126*  BUN 35*  CREATININE 1.23*  CALCIUM 8.6*   GFR: Estimated Creatinine Clearance: 22.8 mL/min (A) (by C-G formula based on SCr of 1.23 mg/dL (H)). Liver Function Tests: Recent Labs  Lab 09/26/17 1139  AST 20  ALT 7*  ALKPHOS 106  BILITOT 0.7  PROT 7.5  ALBUMIN 2.8*   No results for input(s): LIPASE, AMYLASE in the last 168 hours. No results for input(s): AMMONIA in the last 168 hours. Coagulation Profile: Recent Labs  Lab 09/26/17 1139 09/27/17 0500  INR 1.24 1.23   Cardiac Enzymes: Recent Labs  Lab 09/26/17 1139  TROPONINI 0.11*   BNP (last 3 results) No results for input(s): PROBNP in the last 8760 hours. HbA1C: No results for input(s): HGBA1C in the last 72 hours. CBG: No results for input(s): GLUCAP in the last 168 hours. Lipid Profile: No results for input(s): CHOL, HDL, LDLCALC, TRIG, CHOLHDL, LDLDIRECT in the last 72 hours. Thyroid Function Tests: No results for input(s): TSH, T4TOTAL, FREET4, T3FREE, THYROIDAB in the last 72 hours. Anemia  Panel: No results for input(s): VITAMINB12, FOLATE, FERRITIN, TIBC, IRON, RETICCTPCT in the last 72 hours. Sepsis Labs: Recent Labs  Lab 09/26/17 1140  LATICACIDVEN 1.59    Recent Results (from the past 240 hour(s))  C difficile quick scan w PCR reflex     Status: None   Collection Time: 09/26/17  2:06 PM  Result Value Ref Range Status   C Diff antigen NEGATIVE NEGATIVE Final   C Diff toxin NEGATIVE NEGATIVE Final   C Diff interpretation No C. difficile detected.  Final    Comment: Performed at [facility], [address], [place], [place] [address]    Radiology Studies: Dg Chest 1 View  Result Date: 09/26/2017 CLINICAL DATA:  Altered mental status. EXAM: CHEST  1 VIEW COMPARISON:  Chest x-ray dated September 02, 2017. FINDINGS: Unchanged left chest wall pacemaker. The heart size and mediastinal contours are within normal limits. Normal pulmonary vascularity. No focal consolidation, pleural effusion, or pneumothorax. Unchanged elevation of the right hemidiaphragm. IMPRESSION: No active disease. Electronically Signed   By: Obie Dredge M.D.   On: 09/26/2017 12:19   Ct Head Wo Contrast  Result Date: 09/26/2017 CLINICAL DATA:  Altered level of consciousness EXAM: CT HEAD WITHOUT CONTRAST TECHNIQUE: Contiguous axial images were obtained from the base of the skull through the vertex without intravenous contrast. The examination is significantly limited by patient motion artifact. COMPARISON:  04/10/2017 FINDINGS: Brain: Chronic atrophic and white matter ischemic changes are seen similar to that noted on the prior exam. No findings to suggest acute hemorrhage, acute infarction or space-occupying mass lesion are noted. Vascular: No hyperdense vessel or unexpected calcification. Skull: Normal. Negative for fracture or focal lesion. Sinuses/Orbits: No acute finding. Other: None. IMPRESSION: Somewhat limited exam due to patient motion. Chronic atrophic and ischemic changes without  acute abnormality. Electronically Signed   By: Alcide Clever M.D.   On: 09/26/2017 13:14   Scheduled Meds: . enoxaparin (LOVENOX) injection  30 mg Subcutaneous Q24H   Continuous Infusions: . ceFEPime (MAXIPIME) IV    . dextrose 75 mL/hr at 09/27/17 0438    LOS: 1 day   Merlene Laughter, DO [facility] Pager [phone]  If 7PM-7AM, please contact night-coverage [email or web address] Password [email or web address] 09/27/2017, 8:20 AM

## 2017-09-27 NOTE — Progress Notes (Signed)
Physical Therapy Evaluation Patient Details Name: Sherri Leonard MRN: [id number] DOB: Jun 18, 1920 Today's Date: 09/27/2017   History of Present Illness  Sherri Leonard is a 84 y/o female presenting to ED on 5/10 with AMS, fever, volume depletion, AKI, and hypernatremia. Patient with a PMH significant for dementia, OA, brain surgery, and pacemaker. Recent L hip hemi on 09/02/17 after a fall with femoral neck fracture.  Clinical Impression  Eval completed this AM with patient total assist for all bed mobility including rolling and supine to/from sit. Once EOB, able to sit for ~4 min unsupported. Unable to follow one-step commands to help initiate mobility even with heavy verbal and tactile cueing. Patient unable to provide history and no family available to provide baseline level of functioning. Did have recent hospital admission due to femoral neck fracture with L hip hemi. Patient likely to need continued total assist for all mobility. Recommend return to SNF.     Follow Up Recommendations No PT follow up(will likely need higher level of care)    Equipment Recommendations  (will assess at next venue of care)    Recommendations for Other Services       Precautions / Restrictions Precautions Precautions: Fall;Posterior Hip Precaution Comments: recent L THA on 09/02/17 - not during this admission, but still relevant Restrictions Weight Bearing Restrictions: No      Mobility  Bed Mobility Overal bed mobility: Needs Assistance Bed Mobility: Supine to Sit;Sit to Supine;Rolling Rolling: Total assist   Supine to sit: Total assist;HOB elevated Sit to supine: Total assist;HOB elevated   General bed mobility comments: unable to follow one-step commands with all bed mobility.   Transfers                 General transfer comment: deferred  Ambulation/Gait                Stairs            Wheelchair Mobility    Modified Rankin (Stroke Patients Only)        Balance Overall balance assessment: Needs assistance Sitting-balance support: No upper extremity supported;Feet supported Sitting balance-Leahy Scale: Fair Sitting balance - Comments: patient total assist to EOB, but able to maintain unsupported sitting for ~4 minutes. moaning throughout session                                      Pertinent Vitals/Pain Pain Assessment: Faces Faces Pain Scale: Hurts little more Pain Descriptors / Indicators: Moaning Pain Intervention(s): Limited activity within patient's tolerance;Repositioned;Monitored during session    Home Living Family/patient expects to be discharged to:: Skilled nursing facility                      Prior Function Level of Independence: Needs assistance         Comments: patient unable to provide history with no family present, per notes was a resident of SNF     Hand Dominance        Extremity/Trunk Assessment        Lower Extremity Assessment Lower Extremity Assessment: Generalized weakness    Cervical / Trunk Assessment Cervical / Trunk Assessment: Kyphotic  Communication   Communication: Receptive difficulties;Expressive difficulties  Cognition Arousal/Alertness: Awake/alert Behavior During Therapy: WFL for tasks assessed/performed Overall Cognitive Status: History of cognitive impairments - at baseline  General Comments: history of dementia, moaning throughout session with all positioning      General Comments General comments (skin integrity, edema, etc.): feet elevated on pillows while supine    Exercises     Assessment/Plan    PT Assessment All further PT needs can be met in the next venue of care  PT Problem List Decreased mobility;Decreased cognition;Decreased balance;Decreased strength;Decreased range of motion;Decreased activity tolerance;Decreased knowledge of use of DME;Decreased safety awareness;Decreased knowledge  of precautions;Pain       PT Treatment Interventions      PT Goals (Current goals can be found in the Care Plan section)  Acute Rehab PT Goals Patient Stated Goal: unable to state goal PT Goal Formulation: All assessment and education complete, DC therapy    Frequency     Barriers to discharge        Co-evaluation               AM-PAC PT "6 Clicks" Daily Activity  Outcome Measure Difficulty turning over in bed (including adjusting bedclothes, sheets and blankets)?: Unable Difficulty moving from lying on back to sitting on the side of the bed? : Unable Difficulty sitting down on and standing up from a chair with arms (e.g., wheelchair, bedside commode, etc,.)?: Unable Help needed moving to and from a bed to chair (including a wheelchair)?: Total Help needed walking in hospital room?: Total Help needed climbing 3-5 steps with a railing? : Total 6 Click Score: 6    End of Session   Activity Tolerance: Patient tolerated treatment well;Other (comment)(cognition limiting) Patient left: in bed;with call bell/phone within reach;with bed alarm set Nurse Communication: Mobility status PT Visit Diagnosis: Other abnormalities of gait and mobility (R26.89);Muscle weakness (generalized) (M62.81)    Time: 7001-7494 PT Time Calculation (min) (ACUTE ONLY): 24 min   Charges:   PT Evaluation $PT Eval Moderate Complexity: 1 Mod     PT G Codes:        Sherri Leonard, PT, DPT 09/27/17 11:47 AM

## 2017-09-28 DIAGNOSIS — L89159 Pressure ulcer of sacral region, unspecified stage: Secondary | ICD-10-CM

## 2017-09-28 LAB — CBC WITH DIFFERENTIAL/PLATELET
BASOS ABS: 0 10*3/uL (ref 0.0–0.1)
BASOS PCT: 0 %
Eosinophils Absolute: 0.3 10*3/uL (ref 0.0–0.7)
Eosinophils Relative: 3 %
HCT: 32.2 % — ABNORMAL LOW (ref 36.0–46.0)
HEMOGLOBIN: 9.6 g/dL — AB (ref 12.0–15.0)
Lymphocytes Relative: 17 %
Lymphs Abs: 1.5 10*3/uL (ref 0.7–4.0)
MCH: 28.9 pg (ref 26.0–34.0)
MCHC: 29.8 g/dL — ABNORMAL LOW (ref 30.0–36.0)
MCV: 97 fL (ref 78.0–100.0)
Monocytes Absolute: 0.4 10*3/uL (ref 0.1–1.0)
Monocytes Relative: 4 %
NEUTROS PCT: 76 %
Neutro Abs: 6.4 10*3/uL (ref 1.7–7.7)
Platelets: 198 10*3/uL (ref 150–400)
RBC: 3.32 MIL/uL — ABNORMAL LOW (ref 3.87–5.11)
RDW: 15.5 % (ref 11.5–15.5)
WBC: 8.6 10*3/uL (ref 4.0–10.5)

## 2017-09-28 LAB — COMPREHENSIVE METABOLIC PANEL
ALBUMIN: 2.2 g/dL — AB (ref 3.5–5.0)
ALK PHOS: 86 U/L (ref 38–126)
ALT: 7 U/L — AB (ref 14–54)
AST: 23 U/L (ref 15–41)
Anion gap: 10 (ref 5–15)
BILIRUBIN TOTAL: 0.8 mg/dL (ref 0.3–1.2)
BUN: 21 mg/dL — AB (ref 6–20)
CALCIUM: 7.9 mg/dL — AB (ref 8.9–10.3)
CO2: 21 mmol/L — ABNORMAL LOW (ref 22–32)
Chloride: 106 mmol/L (ref 101–111)
Creatinine, Ser: 0.7 mg/dL (ref 0.44–1.00)
GFR calc Af Amer: >60 mL/min (ref >60)
GFR calc non Af Amer: >60 mL/min (ref >60)
Glucose, Bld: 161 mg/dL — ABNORMAL HIGH (ref 65–99)
Potassium: 3.8 mmol/L (ref 3.5–5.1)
Sodium: 137 mmol/L (ref 135–145)
TOTAL PROTEIN: 6.1 g/dL — AB (ref 6.5–8.1)

## 2017-09-28 LAB — MAGNESIUM: Magnesium: 1.4 mg/dL — ABNORMAL LOW (ref 1.7–2.4)

## 2017-09-28 LAB — PHOSPHORUS: Phosphorus: 2.6 mg/dL (ref 2.5–4.6)

## 2017-09-28 MED ORDER — VITAMINS A & D EX OINT
TOPICAL_OINTMENT | CUTANEOUS | Status: AC
  Filled 2017-09-28: qty 5

## 2017-09-28 MED ORDER — ENOXAPARIN SODIUM 40 MG/0.4ML ~~LOC~~ SOLN
40.0000 mg | SUBCUTANEOUS | Status: DC
  Administered 2017-09-28 – 2017-09-30 (×3): 40 mg via SUBCUTANEOUS
  Filled 2017-09-28 (×3): qty 0.4

## 2017-09-28 MED ORDER — MAGNESIUM SULFATE 50 % IJ SOLN
3.0000 g | Freq: Once | INTRAVENOUS | Status: AC
  Administered 2017-09-28: 3 g via INTRAVENOUS
  Filled 2017-09-28: qty 6

## 2017-09-28 NOTE — Progress Notes (Signed)
PROGRESS NOTE    Sherri Leonard  ZOX:[id number] DOB: 06/21/1920 DOA: 09/26/2017 PCP: Sherron Monday, MD   Brief Narrative:  The patient is a 84 year old female from SNF with a PMH of  Sick sinus syndrome with pacemaker, possible Parkinson's, bipolar disorder, anemia, Osteoarthritis, and other comorbidities who was recently Admitted 4/16-4/22 for a Left Hip fracture and at that admission placed on dysphagia 1 diet at that time.   She returns from [place] nursing facility after having been rehabilitated there and was found to be floridly septic and dehydrated-she had a T-max of 100 was unable to provide any meaningful history. Dr. Mahala Menghini discussed the case with her her [facility] POA Armina who is the grand Granddaughter and her CODE Status was changed to DNR. She was admitted for Sepsis and Dehydration and Palliative Care has been consulted.   Assessment & Plan:   Active Problems:   Acute kidney injury (HCC)   Pressure injury of skin  Sepsis 2/2 unclear Etiology so far with Possible Wound Infection vs. Other Etiology; work-up currently in process -Upon admission patient was found to have a low temperature, was tachycardic, tachypneic, iron depleted and a suspected source of infection of the sacral wound -Sepsis Physiology is improving  -Given a D5W/half normal saline bolus of 500 and mils and then started on D5W  at 75 mL's per hour with improvement  -WBC went from 10.5 -> 11.0 -> 8.6 -Lactic acid level is 1.59 -Cultures x2 showed no growth at 2 Days  -C. Dfficile was negative along with fecal occult blood -Chest x-ray showed no active disease -Urinalysis showed cloudy urine appearance with moderate hemoglobin, negative leukocytes, negative nitrites, few bacteria, 6-10 WBCs; Urine culture less than 10,000 colonies of insignificant growth -GI pathogen panel Negative so Enteric Precautions discontinued  -Continue with IV fluid as below -Palliative care has been consulted for further  goals of care and will have Family meeting with HCPOA [place] on 09/29/17  AKI, improving  -Wheezing.  BUN/creatinine went from 35/1.23 and is now 21/0.70 -IV fluid hydration with D5W at 75 mL/hr now reduced to 50 mL/hr; Was given a D5W 1/2 NS 500 mL Bolus -Avoid nephrotoxic medications if possible -Continue to monitor and repeat CMP in a.m.  Hypernatremia -Likely from dehydration.  Sodium level on admission was 154 -Sodium is now improved to 137 after IV fluid hydration -Continue IV fluid as above Continue to monitor and repeat CMP in a.m.  Hypokalemia -Patient's potassium this morning was 3.3 and improved to 3.8 -Replete with IV KCl 40 mEq along with 10 mmol of IV K-Phos yesterday  -Continue to monitor and replete as necessary -Repeat CMP in a.m.  Hypophosphatemia -Patient's phosphorus level this morning was 2.2 and improved to 2.6 -Replete with IV 10 mmol of K-Phos yesterday  -Continue to monitor and replete as necessary -Repeat phosphorus level in a.m.  Hypomagnesemia -Patient's magnesium level this morning is 1.4 -Replete with IV mag sulfate 3 g - continue to monitor and replete as necessary -Repeat magnesium level in the a.m.  Mild Leukocytosis, improving  -Patient's WBC went from 10.5 -> 11.0 -> 8.6 -Currently on empiric antibiotics with IV cefepime 2 g every 24 hours for suspected wound infection and will continue  -Continue to monitor for signs and symptoms of infection -Repeat CBC with differential in a.m.  Toxic Metabolic Encephalopathy -Likely 2/2 to polypharmacy and sepsis; Has improved slightly but still unable to provide a subjective history  -CT head was somewhat limited exam due to patient  motion but showed chronic atrophic and ischemic changes without acute abnormality. -SLP ordered and they are recommending strict n.p.o yesterday but re-evaluation recommending Dysphagia 1 Diet with Honey Thick Liquids -Her p.o. medications have been held including the  Baclofen 5 mg as needed for muscle spasms, Sertraline 125 mg p.o. daily, Tramadol 25 mg p.o. twice daily  Type 2 NSTEMI -Troponin mildly elevated at 0.11 in the setting of AKI and suspected infection and it would not change management in someone with Aementia and Advanced Age  Chronic bundle branch block with sick sinus syndrome -stable at this time  Bedsore/Sacral Wound poA  -WOC Consult -We will need turning every 2 hourly as possible  ? Mouth sore -Oral Care Per Nursing  Normocytic Anemia -Patient's hemoglobin went from 10.8/35.3 on admission and is now 9.6/32.2 -Likely dilutional drop -Check anemia panel in the a.m. -Continue monitor for signs and symptoms of bleeding; FOBT Negative -Repeat CBC in AM  DVT prophylaxis: SCDs; Enoxaparin 30 mcg sq q24h Code Status: DO NOT RESUSCITATE  Family Communication: No family present at bedside Disposition Plan: Returning to SNF with Palliative Care vs. Hospice  Consultants:  Palliative Care Medicine   Procedures: None   Antimicrobials:  Anti-infectives (From admission, onward)   Start     Dose/Rate Route Frequency Ordered Stop   09/27/17 1600  ceFEPIme (MAXIPIME) 2 g in sodium chloride 0.9 % 100 mL IVPB     2 g 200 mL/hr over 30 Minutes Intravenous Every 24 hours 09/26/17 1514     09/26/17 1515  ceFEPIme (MAXIPIME) 2 g in sodium chloride 0.9 % 100 mL IVPB     2 g 200 mL/hr over 30 Minutes Intravenous STAT 09/26/17 1507 09/26/17 1549     Subjective: And examined at bedside and is calmer today not morning.  Did respond to verbal and physical stimuli however she is very incomprehensible.  No nursing reports overnight.  However she did have a large soft bowel movement earlier this morning.  No other concerns or complaints and Palliative Care family meeting possibly in the AM.   Objective: Vitals:   09/27/17 2005 09/27/17 2028 09/28/17 0433 09/28/17 1526  BP: (!) 121/95  131/71 127/78  Pulse: (!) 104  90 88  Resp:   18 16    Temp:   97.7 F (36.5 C) 98.9 F (37.2 C)  TempSrc:   Oral Oral  SpO2: (!) 82% 94% 93%   Weight:      Height:        Intake/Output Summary (Last 24 hours) at 09/28/2017 1830 Last data filed at 09/28/2017 1255 Gross per 24 hour  Intake 1208.75 ml  Output -  Net 1208.75 ml   Filed Weights   09/26/17 1501 09/26/17 1841  Weight: 56.7 kg (125 lb) 51.6 kg (113 lb 12.8 oz)   Examination: Physical Exam:  Constitutional: Thin chronically ill-appearing elderly African-American female who appears more comfortable than yesterday but still unable to provide a subjective history as it is difficult to understand what she is saying Eyes: Sclera anicteric and lids and conjunctive are normal ENMT: External ears and nose appear normal Neck: Appears supple with no JVD Respiratory: Diminished to auscultation bilaterally with no appreciable wheezing, rales, rhonchi.  Respiratory effort is normal and patient is not tachypneic or using accessory muscles to breathe Cardiovascular: Regular rate and rhythm.  No appreciable murmurs, rubs or gallops.  Mild lower extremity edema Abdomen: Soft, nontender, nondistended.  Bowel sounds present all 4 quadrants GU: Deferred Musculoskeletal: Left  leg is mobilized from hip fracture Skin: Has a large sacral bedsore/wound.  No appreciable induration Neurologic: Limited neuro examination due to patient participation.  Did respond to physical verbal stimuli today and was talking but was incomprehensible Psychiatric: Impaired judgment and insight.  Appears calmer today  Data Reviewed: I have personally reviewed following labs and imaging studies  CBC: Recent Labs  Lab 09/26/17 1139 09/27/17 1017 09/28/17 0652  WBC 10.5 11.0* 8.6  NEUTROABS 8.5* 9.1* 6.4  HGB 10.8* 12.2 9.6*  HCT 35.3* 40.2 32.2*  MCV 98.1 97.1 97.0  PLT 237 215 198   Basic Metabolic Panel: Recent Labs  Lab 09/26/17 1139 09/27/17 0500 09/27/17 1017 09/28/17 0652  NA 154* 149*  --  137   K 3.7 3.3*  --  3.8  CL 115* 108  --  106  CO2 24 25  --  21*  GLUCOSE 126* 156*  --  161*  BUN 35* 29*  --  21*  CREATININE 1.23* 0.96  --  0.70  CALCIUM 8.6* 8.9  --  7.9*  MG  --   --  1.7 1.4*  PHOS  --   --  2.2* 2.6   GFR: Estimated Creatinine Clearance: 35 mL/min (by C-G formula based on SCr of 0.7 mg/dL). Liver Function Tests: Recent Labs  Lab 09/26/17 1139 09/27/17 0500 09/28/17 0652  AST ALT 7* 10* 7*  ALKPHOS 106 112 86  BILITOT 0.7 0.8 0.8  PROT 7.5 8.0 6.1*  ALBUMIN 2.8* 3.0* 2.2*   No results for input(s): LIPASE, AMYLASE in the last 168 hours. No results for input(s): AMMONIA in the last 168 hours. Coagulation Profile: Recent Labs  Lab 09/26/17 1139 09/27/17 0500  INR 1.24 1.23   Cardiac Enzymes: Recent Labs  Lab 09/26/17 1139  TROPONINI 0.11*   BNP (last 3 results) No results for input(s): PROBNP in the last 8760 hours. HbA1C: No results for input(s): HGBA1C in the last 72 hours. CBG: No results for input(s): GLUCAP in the last 168 hours. Lipid Profile: No results for input(s): CHOL, HDL, LDLCALC, TRIG, CHOLHDL, LDLDIRECT in the last 72 hours. Thyroid Function Tests: No results for input(s): TSH, T4TOTAL, FREET4, T3FREE, THYROIDAB in the last 72 hours. Anemia Panel: No results for input(s): VITAMINB12, FOLATE, FERRITIN, TIBC, IRON, RETICCTPCT in the last 72 hours. Sepsis Labs: Recent Labs  Lab 09/26/17 1140  LATICACIDVEN 1.59    Recent Results (from the past 240 hour(s))  Culture, blood (routine x 2)     Status: None (Preliminary result)   Collection Time: 09/26/17 11:38 AM  Result Value Ref Range Status   Specimen Description   Final    BLOOD RIGHT FOREARM Performed at [facility], [address] [place], [place] [address]    Special Requests   Final    BOTTLES DRAWN AEROBIC AND ANAEROBIC Blood Culture adequate volume Performed at [facility], [address], [place], [place]  [address]    Culture   Final    NO GROWTH 2 DAYS Performed at [facility], [address], [place], [place] [address]    Report Status PENDING  Incomplete  Urine culture     Status: Abnormal   Collection Time: 09/26/17 11:38 AM  Result Value Ref Range Status   Specimen Description   Final    URINE, CLEAN CATCH Performed at [facility], [address], [place], [place] [address]    Special Requests  Final    NONE Performed at [facility], [address], [place], [place] [address]    Culture (A)  Final    <10,000 COLONIES/mL INSIGNIFICANT GROWTH Performed at [facility], [address], [place], [place] [address]    Report Status 09/27/2017 FINAL  Final  Culture, blood (routine x 2)     Status: None (Preliminary result)   Collection Time: 09/26/17 11:39 AM  Result Value Ref Range Status   Specimen Description   Final    BLOOD RIGHT ANTECUBITAL Performed at [facility], [address] [place], [place] [address]    Special Requests   Final    BOTTLES DRAWN AEROBIC ONLY Blood Culture results may not be optimal due to an inadequate volume of blood received in culture bottles Performed at [facility], [address], [place], [place] [address]    Culture   Final    NO GROWTH 2 DAYS Performed at [facility], [address], [place], [place] [address]    Report Status PENDING  Incomplete  Gastrointestinal Panel by PCR , Stool     Status: None   Collection Time: 09/26/17  2:06 PM  Result Value Ref Range Status   Campylobacter species NOT DETECTED NOT DETECTED Final   Plesimonas shigelloides NOT DETECTED NOT DETECTED Final   Salmonella species NOT DETECTED NOT DETECTED Final   Yersinia enterocolitica NOT DETECTED NOT DETECTED Final   Vibrio species NOT DETECTED NOT DETECTED Final   Vibrio cholerae NOT DETECTED NOT DETECTED Final   Enteroaggregative E coli (EAEC) NOT DETECTED NOT  DETECTED Final   Enteropathogenic E coli (EPEC) NOT DETECTED NOT DETECTED Final   Enterotoxigenic E coli (ETEC) NOT DETECTED NOT DETECTED Final   Shiga like toxin producing E coli (STEC) NOT DETECTED NOT DETECTED Final   Shigella/Enteroinvasive E coli (EIEC) NOT DETECTED NOT DETECTED Final   Cryptosporidium NOT DETECTED NOT DETECTED Final   Cyclospora cayetanensis NOT DETECTED NOT DETECTED Final   Entamoeba histolytica NOT DETECTED NOT DETECTED Final   Giardia lamblia NOT DETECTED NOT DETECTED Final   Adenovirus F40/41 NOT DETECTED NOT DETECTED Final   Astrovirus NOT DETECTED NOT DETECTED Final   Norovirus GI/GII NOT DETECTED NOT DETECTED Final   Rotavirus A NOT DETECTED NOT DETECTED Final   Sapovirus (I, II, IV, and V) NOT DETECTED NOT DETECTED Final    Comment: Performed at [facility], [address] [place], [place] [address]  C difficile quick scan w PCR reflex     Status: None   Collection Time: 09/26/17  2:06 PM  Result Value Ref Range Status   C Diff antigen NEGATIVE NEGATIVE Final   C Diff toxin NEGATIVE NEGATIVE Final   C Diff interpretation No C. difficile detected.  Final    Comment: Performed at [facility], [address], [place], [place] [address]    Radiology Studies: No results found. Scheduled Meds: . enoxaparin (LOVENOX) injection  40 mg Subcutaneous Q24H   Continuous Infusions: . ceFEPime (MAXIPIME) IV Stopped (09/27/17 1758)  . dextrose 75 mL/hr at 09/28/17 0532  . magnesium sulfate 1 - 4 g bolus IVPB      LOS: 2 days   Merlene Laughter, DO [facility] Pager [phone]  If 7PM-7AM, please contact night-coverage [email or web address] Password [email or web address] 09/28/2017, 6:30 PM

## 2017-09-28 NOTE — Progress Notes (Signed)
  Speech Language Pathology Treatment: Dysphagia  Patient Details Name: Sherri Leonard MRN: [id number] DOB: May 08, 1921 Today's Date: 09/28/2017 Time: 4098-1191 SLP Time Calculation (min) (ACUTE ONLY): 15 min  Assessment / Plan / Recommendation Clinical Impression  Patient seen for follow-up for dysphagia to assess PO readiness. Pt alert after RN assisted with repositioning; she does not follow commands and vocalizations are minimal, unintelligible. Allows only 2 attempts at oral care. With mod-max cues (verbal, tactile) pt does accept 1/4 tsp amounts of puree, honey thick liquids via spoon. Oral phase is prolonged, with suspected delayed transit and pt makes suckling motions. Swallow appears delayed but airway protection seems adequate, with no overt signs of aspiration. Unable to assess thinner liquids as pt ceased accepting POs orally. Recommend initiating dys 1, honey thick liquids by teaspoon with full assistance/supervision and meds crushed. Will follow up for tolerance, advancement.     HPI HPI: Sick sinus syndrome with pacemaker possible Parkinson's bipolar anemia osteoarthritis, advanced dementia; admitted 4/16-4/22 with L hip fracture and was placed on dysphagia 1 diet on that admission due to likely cognitive-based dysphagia. Reportedly pt has further declined since hip fracture. CXR was negative, head CT negative for acute findings. Bedside swallow eval ordered.      SLP Plan  Continue with current plan of care       Recommendations  Diet recommendations: Dysphagia 1 (puree);Honey-thick liquid Liquids provided via: Teaspoon Medication Administration: Crushed with puree Supervision: Full supervision/cueing for compensatory strategies Compensations: Minimize environmental distractions;Slow rate;Small sips/bites;Other (Comment) Postural Changes and/or Swallow Maneuvers: Seated upright 90 degrees;Upright 30-60 min after meal                Oral Care Recommendations: Oral  care BID Follow up Recommendations: Skilled Nursing facility SLP Visit Diagnosis: Dysphagia, unspecified (R13.10) Plan: Continue with current plan of care       GO               Rondel Baton, MS, CCC-SLP Speech-Language Pathologist  Arlana Lindau 09/28/2017, 5:00 PM

## 2017-09-29 DIAGNOSIS — E876 Hypokalemia: Secondary | ICD-10-CM

## 2017-09-29 DIAGNOSIS — E870 Hyperosmolality and hypernatremia: Secondary | ICD-10-CM

## 2017-09-29 DIAGNOSIS — A419 Sepsis, unspecified organism: Secondary | ICD-10-CM

## 2017-09-29 DIAGNOSIS — G301 Alzheimer's disease with late onset: Secondary | ICD-10-CM

## 2017-09-29 DIAGNOSIS — F0280 Dementia in other diseases classified elsewhere without behavioral disturbance: Secondary | ICD-10-CM

## 2017-09-29 DIAGNOSIS — F329 Major depressive disorder, single episode, unspecified: Secondary | ICD-10-CM

## 2017-09-29 LAB — CBC WITH DIFFERENTIAL/PLATELET
BASOS PCT: 0 %
Basophils Absolute: 0 10*3/uL (ref 0.0–0.1)
EOS ABS: 0.2 10*3/uL (ref 0.0–0.7)
Eosinophils Relative: 3 %
HEMATOCRIT: 29.1 % — AB (ref 36.0–46.0)
Hemoglobin: 9.1 g/dL — ABNORMAL LOW (ref 12.0–15.0)
Lymphocytes Relative: 18 %
Lymphs Abs: 1.2 10*3/uL (ref 0.7–4.0)
MCH: 29.5 pg (ref 26.0–34.0)
MCHC: 31.3 g/dL (ref 30.0–36.0)
MCV: 94.5 fL (ref 78.0–100.0)
MONO ABS: 0.6 10*3/uL (ref 0.1–1.0)
MONOS PCT: 9 %
NEUTROS ABS: 4.7 10*3/uL (ref 1.7–7.7)
NEUTROS PCT: 70 %
Platelets: 213 10*3/uL (ref 150–400)
RBC: 3.08 MIL/uL — ABNORMAL LOW (ref 3.87–5.11)
RDW: 15.4 % (ref 11.5–15.5)
WBC: 6.8 10*3/uL (ref 4.0–10.5)

## 2017-09-29 LAB — COMPREHENSIVE METABOLIC PANEL
ALBUMIN: 2.3 g/dL — AB (ref 3.5–5.0)
ALT: 8 U/L — ABNORMAL LOW (ref 14–54)
ANION GAP: 11 (ref 5–15)
AST: 18 U/L (ref 15–41)
Alkaline Phosphatase: 83 U/L (ref 38–126)
BILIRUBIN TOTAL: 0.4 mg/dL (ref 0.3–1.2)
BUN: 14 mg/dL (ref 6–20)
CALCIUM: 8.1 mg/dL — AB (ref 8.9–10.3)
CHLORIDE: 105 mmol/L (ref 101–111)
CO2: 20 mmol/L — AB (ref 22–32)
CREATININE: 0.64 mg/dL (ref 0.44–1.00)
GFR calc Af Amer: >60 mL/min (ref >60)
GFR calc non Af Amer: >60 mL/min (ref >60)
GLUCOSE: 109 mg/dL — AB (ref 65–99)
POTASSIUM: 3.1 mmol/L — AB (ref 3.5–5.1)
SODIUM: 136 mmol/L (ref 135–145)
Total Protein: 6.1 g/dL — ABNORMAL LOW (ref 6.5–8.1)

## 2017-09-29 LAB — PHOSPHORUS: PHOSPHORUS: 2 mg/dL — AB (ref 2.5–4.6)

## 2017-09-29 LAB — RETICULOCYTES
RBC.: 3.12 MIL/uL — ABNORMAL LOW (ref 3.87–5.11)
RETIC COUNT ABSOLUTE: 62.4 10*3/uL (ref 19.0–186.0)
RETIC CT PCT: 2 % (ref 0.4–3.1)

## 2017-09-29 LAB — IRON AND TIBC
IRON: 28 ug/dL (ref 28–170)
Saturation Ratios: 23 % (ref 10.4–31.8)
TIBC: 120 ug/dL — AB (ref 250–450)
UIBC: 92 ug/dL

## 2017-09-29 LAB — MAGNESIUM: MAGNESIUM: 2 mg/dL (ref 1.7–2.4)

## 2017-09-29 LAB — VITAMIN B12: VITAMIN B 12: 1038 pg/mL — AB (ref 180–914)

## 2017-09-29 LAB — FERRITIN: FERRITIN: 742 ng/mL — AB (ref 11–307)

## 2017-09-29 LAB — FOLATE: Folate: 8.3 ng/mL (ref >5.9)

## 2017-09-29 MED ORDER — POTASSIUM CHLORIDE 20 MEQ PO PACK
40.0000 meq | PACK | Freq: Once | ORAL | Status: DC
  Administered 2017-09-29: 40 meq via ORAL
  Filled 2017-09-29: qty 2

## 2017-09-29 MED ORDER — SODIUM CHLORIDE 0.9% FLUSH
10.0000 mL | INTRAVENOUS | Status: DC | PRN
  Administered 2017-09-30: 20 mL
  Filled 2017-09-29: qty 40

## 2017-09-29 MED ORDER — POTASSIUM CHLORIDE 10 MEQ/100ML IV SOLN
10.0000 meq | INTRAVENOUS | Status: AC
  Administered 2017-09-29 (×2): 10 meq via INTRAVENOUS
  Filled 2017-09-29 (×2): qty 100

## 2017-09-29 MED ORDER — POTASSIUM PHOSPHATES 15 MMOLE/5ML IV SOLN
20.0000 mmol | Freq: Once | INTRAVENOUS | Status: AC
  Administered 2017-09-29: 20 mmol via INTRAVENOUS
  Filled 2017-09-29: qty 6.67

## 2017-09-29 MED ORDER — COLLAGENASE 250 UNIT/GM EX OINT
TOPICAL_OINTMENT | Freq: Every day | CUTANEOUS | Status: DC
  Administered 2017-09-29 – 2017-10-02 (×3): via TOPICAL
  Filled 2017-09-29 (×2): qty 90

## 2017-09-29 NOTE — Progress Notes (Signed)
Spoke with RN regarding midline consult and discussed midline trained VAST team member not present.

## 2017-09-29 NOTE — Progress Notes (Signed)
Pharmacy Antibiotic Note  Sherri Leonard is a 84 y.o. female admitted on 09/26/2017 with AMS, fever, volume depletion, AKI, and hypernatremia. Patient recently underwent left hip hemiarthroplasty on 09/02/2017. Pharmacy has been consulted for Cefepime dosing for possible wound infection.  Day #4 cefepime  Renal: SCr WNL  WBC WNL  afebrile  Plan: Cefepime 2g IV q24h  Monitor renal function, cultures, clinical course.  Await plans for antibiotic   Height:  (165.1 cm) Weight: 113 lb 12.8 oz (51.6 kg) IBW/kg (Calculated) : 57  Temp (24hrs), Avg:98.2 F (36.8 C), Min:97.7 F (36.5 C), Max:98.9 F (37.2 C)  Recent Labs  Lab 09/26/17 1139 09/26/17 1140 09/27/17 0500 09/27/17 1017 09/28/17 0652 09/29/17 0547  WBC 10.5  --   --  11.0* 8.6 6.8  CREATININE 1.23*  --  0.96  --  0.70 0.64  LATICACIDVEN  --  1.59  --   --   --   --     Estimated Creatinine Clearance: 35 mL/min (by C-G formula based on SCr of 0.64 mg/dL).    Allergies  Allergen Reactions  . Lorazepam Other (See Comments)    Causes violence  Other reaction(s): Agitation  . Benzodiazepines     Antimicrobials this admission: 5/10 >> Cefepime >>  Dose adjustments this admission:  Microbiology results: 5/10 BCx: ngtd 5/10 UCx: insig grwth 5/10 C.diff PCR: negative 5/10 GI panel: neg Thank you for allowing pharmacy to be a part of this patient's care.   Juliette Alcide, PharmD, BCPS.   Pager: [phone] 09/29/2017 11:00 AM

## 2017-09-29 NOTE — Progress Notes (Signed)
   09/29/17 1445  Clinical Encounter Type  Visited With Patient  Visit Type Initial  Referral From Nurse  Consult/Referral To Chaplain  Spiritual Encounters  Spiritual Needs Prayer   Rounding on Palliative Patients.  Patient was in the bed and opened her eyes when I called her name.  Not sure she fully knew what was going on and when I asked how she was she shook her head and mumbled not too good today.  I asked if I could pray and she nodded yes and when I did she closed her eyes and almost seemed to go to a place of peace and mumbled what I thought might have been music.  Will follow and support as needed. Chaplain Agustin Cree

## 2017-09-29 NOTE — Progress Notes (Signed)
Power Glide Midline Catheter in Left arm.

## 2017-09-29 NOTE — Progress Notes (Signed)
Palliative Care Progress Note  Reason for consult: Goals of care  I saw and examined Sherri Leonard today.  She did acknowledge my presence, however, she remains largely noncommunicative and otherwise did not participate in encounter.    During my initial encounter earlier this week, I understood plan to be patient's granddaughter to call this morning in order to set up a family meeting to discuss today as plan from the admitting physician was to plan for trial of 72 hours of IV fluids and antibiotics to see if her grandmother improves.  Did not receive call today, however, and in talking with patient's granddaughter on the phone this evening, she reports that she had thought that she was going to receive a call from someone at the hospital this morning to discuss her grandmother's care.  We talked about her clinical course over the last couple of days including some improvement in lab values, but still with continued poor cognition, nutrition, and functional status.  I also expressed to her my concern that her grandmother is not performing any daily activities nor is she taking in enough nutrition or hydration to sustain herself and she is going to have a quick decline once hospital level interventions are no longer being performed.  She then stated that she will need to come and see her grandmother in person prior to making any other decisions about long-term goals of care.  I asked that she is going to be able to come this evening and she stated that she is not able to come today or tomorrow.  She reports that she would likely be able to meet with the member of our team on Wednesday morning at the earliest.  I did discuss with her that we are going to need to make decisions regarding care plan moving forward including plans for discharge.  Unfortunately, I was not able to meet for family meeting with family as planned today.  I will ask another member of the palliative medicine team team to follow-up  with the patient's granddaughter/surrogate decision-maker tomorrow.  Her granddaughter reports that she has a significant commitment at work tomorrow and will not be able to come to the hospital and may not be able to talk on the phone with [place] either.  -We will continue to engage family in progress conversation as we are able.    Total time: 30 minutes Greater than 50%  of this time was spent counseling and coordinating care related to the above assessment and plan.  Sherri Minus, MD [facility] Palliative Medicine Team [phone]

## 2017-09-29 NOTE — Progress Notes (Signed)
PROGRESS NOTE    Sherri Leonard  WJX:[id number] DOB: [birth year] DOA: 09/26/2017 PCP: Sherron Monday, MD   Brief Narrative:  The patient is a 84 year old female from SNF with a PMH of  Sick sinus syndrome with pacemaker, possible Parkinson's, bipolar disorder, anemia, Osteoarthritis, and other comorbidities who was recently Admitted 4/16-4/22 for a Left Hip fracture and at that admission placed on dysphagia 1 diet at that time.   She returns from [facility] nursing facility after having been rehabilitated there and was found to be floridly septic and dehydrated-she had a T-max of 100 was unable to provide any meaningful history. Dr. Mahala Menghini discussed the case with her her [facility] POA Armina who is the grand Granddaughter and her CODE Status was changed to DNR. She was admitted for Sepsis and Dehydration and Palliative Care has been consulted.  Active care to meet with patient's granddaughter today likely for family discussion and further goals of care.  Assessment & Plan:   Active Problems:   Acute kidney injury (HCC)   Pressure injury of skin  Sepsis 2/2 unclear Etiology so far with Possible Wound Infection vs. Other Etiology; work-up currently in process currently no etiology found other than sacral wound -Upon admission patient was found to have a low temperature, was tachycardic, tachypneic, iron depleted and a suspected source of infection of the sacral wound -Sepsis Physiology is improving  -Given a D5W/half normal saline bolus of 500 and mils and then started on D5W  at 75 mL's per hour with improvement now cut down to 50 mL's per hour -WBC went from 10.5 -> 11.0 -> 8.6 -> 6.8 -Lactic acid level is 1.59 -Cultures x2 still showed no growth at 2 Days  -C. Dfficile was negative along with fecal occult blood -Chest x-ray showed no active disease -Urinalysis showed cloudy urine appearance with moderate hemoglobin, negative leukocytes, negative nitrites, few bacteria, 6-10 WBCs; Urine  culture less than 10,000 colonies of insignificant growth -GI pathogen panel Negative so Enteric Precautions discontinued  -Continue with IV fluid as below -Palliative care has been consulted for further goals of care and will have Family meeting with HCPOA [place] on 09/29/17.  Palliative to follow-up with granddaughter about goals of care today  AKI, improving  -Wheezing.  BUN/creatinine went from 35/1.23 and is now 14/0.64 -IV fluid hydration with D5W at 75 mL/hr now reduced to 50 mL/hr we will continue today as patient is not eating properly; Was given a D5W 1/2 NS 500 mL Bolus previously  -Avoid nephrotoxic medications if possible -Continue to monitor and repeat CMP in a.m.  Hypernatremia -Likely from dehydration.  Sodium level on admission was 154 -Sodium is now improved to 136 after IV fluid hydration -Continue IV fluid as above for now as patient is not eating as well Continue to monitor and repeat CMP in a.m.  Hypokalemia -Patient's potassium this morning was 3.1 and likely from patient's continued diarrhea -Replete with IV KCl 30 mEq along with 20 mmol of IV K-Phos  -Continue to monitor and replete as necessary -Repeat CMP in a.m.  Hypophosphatemia -Patient's phosphorus level this morning was 2.0 -Replete with IV 20 mmol of K-Phos  -Continue to monitor and replete as necessary -Repeat phosphorus level in a.m.  Hypomagnesemia -Patient's magnesium level went from 1.4 and is now 2.0 -Continue to monitor and replete as necessary -Repeat magnesium level in the a.m.  Mild Leukocytosis, improved  -Patient's WBC went from 10.5 -> 11.0 -> 8.6 -> 6.8 -Currently on empiric antibiotics with IV  cefepime 2 g every 24 hours for suspected wound infection and will continue  -Continue to monitor for signs and symptoms of infection -Repeat CBC with differential in a.m.  Toxic Metabolic Encephalopathy -Likely 2/2 to polypharmacy and sepsis; Has improved slightly but still unable to  provide a subjective history  -CT head was somewhat limited exam due to patient motion but showed chronic atrophic and ischemic changes without acute abnormality. -SLP ordered and they are recommending strict n.p.o yesterday but re-evaluation recommending Dysphagia 1 Diet with Honey Thick Liquids however patient is not taking much p.o. and appears more lethargic today -Her p.o. medications have been held including the Baclofen 5 mg as needed for muscle spasms, Sertraline 125 mg p.o. daily, Tramadol 25 mg p.o. twice daily we will continue to hold these given her lethargy  Type 2 NSTEMI -Troponin mildly elevated at 0.11 in the setting of AKI and suspected infection and it would not change management in someone with Aementia and Advanced Age  Chronic bundle branch block with sick sinus syndrome -Stable at this time  Bedsore/Sacral Wound poA  -WOC Consult pending  -We will need turning every 2 hourly as possible  ? Mouth sore -Oral Care Per Nursing  Normocytic Anemia -Patient's hemoglobin went from 10.8/35.3 on admission and is now 9.1/29.1 -Likely dilutional drop from IVF Rehydration -Check Anemia panel and ordered and pending  -Continue monitor for signs and symptoms of bleeding; FOBT Negative -Repeat CBC in AM  DVT prophylaxis: SCDs; Enoxaparin 30 mcg sq q24h Code Status: DO NOT RESUSCITATE  Family Communication: No family present at bedside Disposition Plan: Returning to SNF with Palliative Care vs. Hospice pending discussion of Palliative Care with Family   Consultants:  Palliative Care Medicine   Procedures: None   Antimicrobials:  Anti-infectives (From admission, onward)   Start     Dose/Rate Route Frequency Ordered Stop   09/27/17 1600  ceFEPIme (MAXIPIME) 2 g in sodium chloride 0.9 % 100 mL IVPB     2 g 200 mL/hr over 30 Minutes Intravenous Every 24 hours 09/26/17 1514     09/26/17 1515  ceFEPIme (MAXIPIME) 2 g in sodium chloride 0.9 % 100 mL IVPB     2 g 200  mL/hr over 30 Minutes Intravenous STAT 09/26/17 1507 09/26/17 1549     Subjective: Seen and examined at bedside still remains pleasantly demented however with more calm and lethargic today.  Was awoken with physical and verbal stimuli and did follow some commands.  There is difficult to understand what she was saying and she continued to moan slightly.  Per nursing patient has not been taking p.o. very much.  Objective: Vitals:   09/28/17 1526 09/28/17 2103 09/29/17 0114 09/29/17 0351  BP: 127/78 (!) 85/61 100/62 (!) 96/52  Pulse: 88 80  82  Resp: Temp: 98.9 F (37.2 C) 98 F (36.7 C)  97.7 F (36.5 C)  TempSrc: Oral Oral  Oral  SpO2:  99%  100%  Weight:      Height:        Intake/Output Summary (Last 24 hours) at 09/29/2017 1251 Last data filed at 09/29/2017 0900 Gross per 24 hour  Intake 2000 ml  Output 0 ml  Net 2000 ml   Filed Weights   09/26/17 1501 09/26/17 1841  Weight: 56.7 kg (125 lb) 51.6 kg (113 lb 12.8 oz)   Examination: Physical Exam:  Constitutional: Patient is a thin chronically ill-appearing elderly African-American female who is slightly more lethargic  today she is somnolent but arousable to physical and verbal stimuli.  Not moaning as much but does have some and still unable to provide a subjective history as she is still incomprehensible Eyes: Sclerae anicteric lids and conjunctive are normal ENMT: External ears and nose appear normal Neck: Supple with no JVD Respiratory: Diminished to auscultation bilaterally with no appreciable wheezing, rales, rhonchi.  Patient was not tachypneic or using accessory muscles to breathe Cardiovascular: Regular rate and rhythm.  No appreciable murmurs, rubs, gallops.  Slight extremity edema Abdomen: Soft, nontender, nondistended.  Bowel sounds present all 4 quadrants GU: Deferred Musculoskeletal: Left hip is immobilized from a hip fracture. Skin: Has a large sacral bedsore/wound no appreciable induration.  No  rashes or lesions on limited skin evaluation deferred Neurologic: Follows some commands and was able to squeeze fingers bilaterally.  Did respond to physical verbal stimuli today but remains incomprehensible is difficult to understand what she is having Psychiatric: Impaired judgment and insight.  Appears calm today and somewhat somnolent and lethargic but arousable  Data Reviewed: I have personally reviewed following labs and imaging studies  CBC: Recent Labs  Lab 09/26/17 1139 09/27/17 1017 09/28/17 0652 09/29/17 0547  WBC 10.5 11.0* 8.6 6.8  NEUTROABS 8.5* 9.1* 6.4 4.7  HGB 10.8* 12.2 9.6* 9.1*  HCT 35.3* 40.2 32.2* 29.1*  MCV 98.1 97.1 97.0 94.5  PLT 237 215 198 213   Basic Metabolic Panel: Recent Labs  Lab 09/26/17 1139 09/27/17 0500 09/27/17 1017 09/28/17 0652 09/29/17 0547  NA 154* 149*  --  137 136  K 3.7 3.3*  --  3.8 3.1*  CL 115* 108  --  106 105  CO2 24 25  --  21* 20*  GLUCOSE 126* 156*  --  161* 109*  BUN 35* 29*  --  21* 14  CREATININE 1.23* 0.96  --  0.70 0.64  CALCIUM 8.6* 8.9  --  7.9* 8.1*  MG  --   --  1.7 1.4* 2.0  PHOS  --   --  2.2* 2.6 2.0*   GFR: Estimated Creatinine Clearance: 35 mL/min (by C-G formula based on SCr of 0.64 mg/dL). Liver Function Tests: Recent Labs  Lab 09/26/17 1139 09/27/17 0500 09/28/17 0652 09/29/17 0547  AST ALT 7* 10* 7* 8*  ALKPHOS 106 112 86 83  BILITOT 0.7 0.8 0.8 0.4  PROT 7.5 8.0 6.1* 6.1*  ALBUMIN 2.8* 3.0* 2.2* 2.3*   No results for input(s): LIPASE, AMYLASE in the last 168 hours. No results for input(s): AMMONIA in the last 168 hours. Coagulation Profile: Recent Labs  Lab 09/26/17 1139 09/27/17 0500  INR 1.24 1.23   Cardiac Enzymes: Recent Labs  Lab 09/26/17 1139  TROPONINI 0.11*   BNP (last 3 results) No results for input(s): PROBNP in the last 8760 hours. HbA1C: No results for input(s): HGBA1C in the last 72 hours. CBG: No results for input(s): GLUCAP in the last 168  hours. Lipid Profile: No results for input(s): CHOL, HDL, LDLCALC, TRIG, CHOLHDL, LDLDIRECT in the last 72 hours. Thyroid Function Tests: No results for input(s): TSH, T4TOTAL, FREET4, T3FREE, THYROIDAB in the last 72 hours. Anemia Panel: No results for input(s): VITAMINB12, FOLATE, FERRITIN, TIBC, IRON, RETICCTPCT in the last 72 hours. Sepsis Labs: Recent Labs  Lab 09/26/17 1140  LATICACIDVEN 1.59    Recent Results (from the past 240 hour(s))  Culture, blood (routine x 2)     Status: None (Preliminary result)   Collection Time:  09/26/17 11:38 AM  Result Value Ref Range Status   Specimen Description   Final    BLOOD RIGHT FOREARM Performed at [facility], [address] [place], [place] [address]    Special Requests   Final    BOTTLES DRAWN AEROBIC AND ANAEROBIC Blood Culture adequate volume Performed at [facility], [address], [place], [place] [address]    Culture   Final    NO GROWTH 2 DAYS Performed at [facility], [address], [place], [place] [address]    Report Status PENDING  Incomplete  Urine culture     Status: Abnormal   Collection Time: 09/26/17 11:38 AM  Result Value Ref Range Status   Specimen Description   Final    URINE, CLEAN CATCH Performed at [facility], [address], [place], [place] [address]    Special Requests   Final    NONE Performed at [facility], [address], [place], [place] [address]    Culture (A)  Final    <10,000 COLONIES/mL INSIGNIFICANT GROWTH Performed at [facility], [address] [place], [place] [address]    Report Status 09/27/2017 FINAL  Final  Culture, blood (routine x 2)     Status: None (Preliminary result)   Collection Time: 09/26/17 11:39 AM  Result Value Ref Range Status   Specimen Description   Final    BLOOD RIGHT ANTECUBITAL Performed at [facility], [address], [place], [place]  [address]    Special Requests   Final    BOTTLES DRAWN AEROBIC ONLY Blood Culture results may not be optimal due to an inadequate volume of blood received in culture bottles Performed at [facility], [address], [place], [place] [address]    Culture   Final    NO GROWTH 2 DAYS Performed at [facility], [address], [place] , [place] [address]    Report Status PENDING  Incomplete  Gastrointestinal Panel by PCR , Stool     Status: None   Collection Time: 09/26/17  2:06 PM  Result Value Ref Range Status   Campylobacter species NOT DETECTED NOT DETECTED Final   Plesimonas shigelloides NOT DETECTED NOT DETECTED Final   Salmonella species NOT DETECTED NOT DETECTED Final   Yersinia enterocolitica NOT DETECTED NOT DETECTED Final   Vibrio species NOT DETECTED NOT DETECTED Final   Vibrio cholerae NOT DETECTED NOT DETECTED Final   Enteroaggregative E coli (EAEC) NOT DETECTED NOT DETECTED Final   Enteropathogenic E coli (EPEC) NOT DETECTED NOT DETECTED Final   Enterotoxigenic E coli (ETEC) NOT DETECTED NOT DETECTED Final   Shiga like toxin producing E coli (STEC) NOT DETECTED NOT DETECTED Final   Shigella/Enteroinvasive E coli (EIEC) NOT DETECTED NOT DETECTED Final   Cryptosporidium NOT DETECTED NOT DETECTED Final   Cyclospora cayetanensis NOT DETECTED NOT DETECTED Final   Entamoeba histolytica NOT DETECTED NOT DETECTED Final   Giardia lamblia NOT DETECTED NOT DETECTED Final   Adenovirus F40/41 NOT DETECTED NOT DETECTED Final   Astrovirus NOT DETECTED NOT DETECTED Final   Norovirus GI/GII NOT DETECTED NOT DETECTED Final   Rotavirus A NOT DETECTED NOT DETECTED Final   Sapovirus (I, II, IV, and V) NOT DETECTED NOT DETECTED Final    Comment: Performed at [facility], [address] [place], [place] [address]  C difficile quick scan w PCR reflex     Status: None  Collection Time: 09/26/17  2:06 PM  Result Value Ref Range Status   C Diff antigen NEGATIVE  NEGATIVE Final   C Diff toxin NEGATIVE NEGATIVE Final   C Diff interpretation No C. difficile detected.  Final    Comment: Performed at [facility], [address], [place], [place] [address]    Radiology Studies: No results found. Scheduled Meds: . enoxaparin (LOVENOX) injection  40 mg Subcutaneous Q24H   Continuous Infusions: . ceFEPime (MAXIPIME) IV Stopped (09/28/17 1700)  . dextrose 50 mL/hr at 09/28/17 2003  . potassium chloride    . potassium PHOSPHATE IVPB (mmol)      LOS: 3 days   Merlene Laughter, DO [facility] Pager [phone]  If 7PM-7AM, please contact night-coverage [email or web address] Password [facility] 09/29/2017, 12:51 PM

## 2017-09-29 NOTE — Consult Note (Signed)
WOC Nurse wound consult note Reason for Consult:Unstageable pressure injury to sacrum and coccyx.  Present on admission.  Likely source of sepsis.  Wound type:unstageable pressure injury to sacrum Pressure Injury POA: Yes Measurement: 5.3 cm x 8.4 cm x 0.3 cm with adherent slough to wound bed over coccyx. Will begin enzymatic debridement. Albumin is 2.3 and contractures to right leg present.  Wound RUE:[id number] tissue/slough Drainage (amount, consistency, odor) minimal serosanguinous.  Periwound:Skin is moist from urine. Is cleaned and dried.  Is unable to tolerate the female urinary diversion system (Purewick), per bedside RN. Will keep clean and dry.  NO disposable briefs or underpads.  Dressing procedure/placement/frequency:Cleanse sacral wound with NS.  APPLY Santyl to wound bed.  Cover with NS moist gauze. Secure with ABD pad and tape. Change daily and PRN soilage.  Will order mattress due to decreased bed mobility.  WOC team will assess weekly.  Maple Hudson RN BSN CWON Pager [phone]

## 2017-09-30 LAB — CBC WITH DIFFERENTIAL/PLATELET
BASOS PCT: 1 %
Basophils Absolute: 0 10*3/uL (ref 0.0–0.1)
Eosinophils Absolute: 0.2 10*3/uL (ref 0.0–0.7)
Eosinophils Relative: 4 %
HEMATOCRIT: 27.8 % — AB (ref 36.0–46.0)
HEMOGLOBIN: 8.8 g/dL — AB (ref 12.0–15.0)
LYMPHS ABS: 1.6 10*3/uL (ref 0.7–4.0)
LYMPHS PCT: 24 %
MCH: 29.9 pg (ref 26.0–34.0)
MCHC: 31.7 g/dL (ref 30.0–36.0)
MCV: 94.6 fL (ref 78.0–100.0)
MONOS PCT: 7 %
Monocytes Absolute: 0.5 10*3/uL (ref 0.1–1.0)
NEUTROS ABS: 4.3 10*3/uL (ref 1.7–7.7)
NEUTROS PCT: 64 %
Platelets: 213 10*3/uL (ref 150–400)
RBC: 2.94 MIL/uL — ABNORMAL LOW (ref 3.87–5.11)
RDW: 15.5 % (ref 11.5–15.5)
WBC: 6.6 10*3/uL (ref 4.0–10.5)

## 2017-09-30 LAB — PHOSPHORUS: Phosphorus: 2.1 mg/dL — ABNORMAL LOW (ref 2.5–4.6)

## 2017-09-30 LAB — COMPREHENSIVE METABOLIC PANEL
ALBUMIN: 2.2 g/dL — AB (ref 3.5–5.0)
ALT: 9 U/L — ABNORMAL LOW (ref 14–54)
ANION GAP: 9 (ref 5–15)
AST: 17 U/L (ref 15–41)
Alkaline Phosphatase: 81 U/L (ref 38–126)
BILIRUBIN TOTAL: 0.6 mg/dL (ref 0.3–1.2)
BUN: 13 mg/dL (ref 6–20)
CALCIUM: 8.4 mg/dL — AB (ref 8.9–10.3)
CO2: 25 mmol/L (ref 22–32)
Chloride: 108 mmol/L (ref 101–111)
Creatinine, Ser: 0.62 mg/dL (ref 0.44–1.00)
GFR calc Af Amer: >60 mL/min (ref >60)
GLUCOSE: 103 mg/dL — AB (ref 65–99)
Potassium: 2.9 mmol/L — ABNORMAL LOW (ref 3.5–5.1)
Sodium: 142 mmol/L (ref 135–145)
TOTAL PROTEIN: 6.1 g/dL — AB (ref 6.5–8.1)

## 2017-09-30 LAB — MAGNESIUM: Magnesium: 1.8 mg/dL (ref 1.7–2.4)

## 2017-09-30 MED ORDER — HALOPERIDOL LACTATE 5 MG/ML IJ SOLN
0.5000 mg | Freq: Once | INTRAMUSCULAR | Status: AC
  Administered 2017-10-01: 0.5 mg via INTRAVENOUS
  Filled 2017-09-30: qty 1

## 2017-09-30 MED ORDER — DEXTROSE 5 % IV SOLN
20.0000 mmol | Freq: Once | INTRAVENOUS | Status: AC
  Administered 2017-09-30: 20 mmol via INTRAVENOUS
  Filled 2017-09-30: qty 6.67

## 2017-09-30 MED ORDER — POTASSIUM CHLORIDE 10 MEQ/100ML IV SOLN
10.0000 meq | INTRAVENOUS | Status: AC
  Administered 2017-09-30 (×5): 10 meq via INTRAVENOUS
  Filled 2017-09-30 (×5): qty 100

## 2017-09-30 NOTE — Progress Notes (Signed)
PMT no charge note  Call placed and discussed with grand daughter Rhae Hammock:  She recalls having conversations with PMT colleague Dr Neale Burly.   She would like further information about the patient's current condition, her infection, sepsis, medications etc. Briefly updated [facility] MD.   We have how ever scheduled a family meeting for 10-01-17 at 10: 30 AM for further goals of care discussions. Further recommendations to follow.   Rosalin Hawking MD [facility] health palliative medicine team [phone]

## 2017-09-30 NOTE — Progress Notes (Signed)
Patient's Cardiac Monitoring has expired.  Notified the PCP

## 2017-09-30 NOTE — Progress Notes (Signed)
PROGRESS NOTE    Sherri Leonard  ZOX:[id number] DOB: 11-22-1920 DOA: 09/26/2017 PCP: Sherron Monday, MD   Brief Narrative:  The patient is a 84 year old female from SNF with a PMH of  Sick sinus syndrome with pacemaker, possible Parkinson's, bipolar disorder, anemia, Osteoarthritis, and other comorbidities who was recently Admitted 4/16-4/22 for a Left Hip fracture and at that admission placed on dysphagia 1 diet at that time.   She returns from [facility] after having been rehabilitated there and was found to be floridly septic and dehydrated-she had a T-max of 100 was unable to provide any meaningful history. Dr. Mahala Menghini discussed the case with her her [facility] POA Armina who is the grand Granddaughter and her CODE Status was changed to DNR. She was admitted for Sepsis and Dehydration and Palliative Care has been consulted.  Palliative Care was to meet with patient's granddaughter yesterday likely for family discussion and further goals of care but there was miscommunication and now meeting likely scheduled for Wednesday 10/01/17.  Assessment & Plan:   Active Problems:   Anemia   Dementia without behavioral disturbance   Depression due to dementia   Acute kidney injury (HCC)   Pressure injury of skin   Sepsis (HCC)   Hypernatremia   Hypokalemia   Hypophosphatemia  Sepsis 2/2 unclear Etiology so far with Possible Wound Infection vs. Other Etiology; work-up currently in process currently no etiology found other than sacral wound -Upon admission patient was found to have a low temperature, was tachycardic, tachypneic, iron depleted and a suspected source of infection of the sacral wound -Sepsis Physiology is improving  -Given a D5W/half normal saline bolus of 500 and mils and then started on D5W  at 75 mL's per hour with improvement now cut down to 50 mL's per hour -WBC now 6.6 -Lactic acid level is 1.59 -Cultures x2 showed no growth at 4 Days  -C. Dfficile was  negative along with fecal occult blood -Chest x-ray showed no active disease -Urinalysis showed cloudy urine appearance with moderate hemoglobin, negative leukocytes, negative nitrites, few bacteria, 6-10 WBCs; Urine culture less than 10,000 colonies of insignificant growth -GI pathogen panel Negative so Enteric Precautions discontinued  -Continue with IV fluid with D5W at 50 mL/hr as below for now as she is still not taking much po Intake  -Palliative care has been consulted for further goals of care and was to have Family meeting with HCPOA [place] on 09/29/17 but due to miscommunication meeting now likely 10/01/17  AKI, improving  -Wheezing.  BUN/creatinine went from 35/1.23 and is now 13/0.62 -C/w D5W at 50 mL/hr as patient is not taking much po intake  -Avoid nephrotoxic medications if possible -Continue to monitor and repeat CMP in a.m.  Hypernatremia, improved  -Likely from dehydration.  Sodium level on admission was 154 -Sodium is now improved to142 after IV fluid hydration -Continue IV fluid as above for now as patient is not eating as well Continue to monitor and repeat CMP in a.m.  Hypokalemia -Patient's potassium this morning was 2.9 and likely from patient's continued diarrhea -Replete with IV KCl 50 mEq along with 20 mmol of IV K-Phos  -Continue to monitor and replete as necessary -Repeat CMP in a.m.  Hypophosphatemia -Patient's phosphorus level this morning was 2.1 -Replete with IV 20 mmol of K-Phos  -Continue to monitor and replete as necessary -Repeat Phosphorus level in a.m.  Hypomagnesemia, improved  -Patient's magnesium level went from 1.4 and is now 1.8 -Continue to monitor and  replete as necessary -Repeat magnesium level in the a.m.  Mild Leukocytosis, improved  -Patient's WBC wernt from 11.0 -> 6.6 -Currently on empiric antibiotics with IV cefepime 2 g every 24 hours for suspected wound infection and will continue for now as she is not taking much po intake    -Continue to monitor for signs and symptoms of infection -Repeat CBC with differential in a.m.  Toxic Metabolic Encephalopathy -Likely 2/2 to polypharmacy and sepsis; Has improved slightly but still unable to provide a subjective history and appears calmer; Still incomprehensible  -CT head was somewhat limited exam due to patient motion but showed chronic atrophic and ischemic changes without acute abnormality. -SLP ordered and they are recommending strict n.p.o yesterday but re-evaluation recommending Dysphagia 1 Diet with Honey Thick Liquids however patient is not taking much p.o. and appears more lethargic today -Her p.o. medications have been held including the Baclofen 5 mg as needed for muscle spasms, Sertraline 125 mg p.o. daily, Tramadol 25 mg p.o. twice daily we will continue to hold these given her Lethargy  Type 2 NSTEMI -Troponin mildly elevated at 0.11 in the setting of AKI and suspected infection and it would not change management in someone with Aementia and Advanced Age  Chronic bundle branch block with sick sinus syndrome -Stable at this time  Bedsore/Sacral Wound poA  -WOC Consult appreciated -WOC Nurse recommending: "Cleanse sacral wound with NS.  APPLY Santyl to wound bed.  Cover with NS moist gauze. Secure with ABD pad and tape. Change daily and PRN soilage.  Will order mattress due to decreased bed mobility." -We will need turning every 2 hourly as possible  ? Mouth sore -Oral Care Per Nursing  Normocytic Anemia -Patient's hemoglobin went from 10.8/35.3 on admission and is now 9.1/29.1 -Likely dilutional drop from IVF Rehydration -Anemia Panel showed iron level of 28, UIBC of 92, TIBC of 120, saturation ratios of 20%, ferritin level 742, folate level of 8.3, and vitamin B12 of 1,038 -Continue monitor for signs and symptoms of bleeding; FOBT Negative -Repeat CBC in AM  DVT prophylaxis: SCDs; Enoxaparin 30 mcg sq q24h Code Status: DO NOT RESUSCITATE  Family  Communication: No family present at bedside Disposition Plan: Returning to SNF with Palliative Care vs. Hospice pending discussion of Palliative Care with Family on Wednesday 10/01/17  Consultants:  Palliative Care Medicine   Procedures: None   Antimicrobials:  Anti-infectives (From admission, onward)   Start     Dose/Rate Route Frequency Ordered Stop   09/27/17 1600  ceFEPIme (MAXIPIME) 2 g in sodium chloride 0.9 % 100 mL IVPB     2 g 200 mL/hr over 30 Minutes Intravenous Every 24 hours 09/26/17 1514     09/26/17 1515  ceFEPIme (MAXIPIME) 2 g in sodium chloride 0.9 % 100 mL IVPB     2 g 200 mL/hr over 30 Minutes Intravenous STAT 09/26/17 1507 09/26/17 1549     Subjective: Seen and examined at bedside continues to be demented and lethargic. She is unable to be aroused from sleep easily however she is incomprehensible when trying to converse with her. She lost IV Access yesterday so Midline was ordered and placed. She does not follow commands today.  Continues to have multiple stools.  No overnight nursing issues reported.   Objective: Vitals:   09/29/17 2126 09/29/17 2134 09/30/17 0427 09/30/17 1311  BP: (!) 187/161 (!) 110/55 134/75 (!) 157/83  Pulse: 100 100 74 69  Resp: Temp: 98.5 F (36.9  C) 98.1 F (36.7 C) 98 F (36.7 C) 98.2 F (36.8 C)  TempSrc: Oral Oral Oral Oral  SpO2: 92% 100% 93% 91%  Weight:      Height:        Intake/Output Summary (Last 24 hours) at 09/30/2017 1428 Last data filed at 09/30/2017 0500 Gross per 24 hour  Intake 100 ml  Output -  Net 100 ml   Filed Weights   09/26/17 1501 09/26/17 1841  Weight: 56.7 kg (125 lb) 51.6 kg (113 lb 12.8 oz)   Examination: Physical Exam:  Constitutional: Patient is a thin chronically ill-appearing African-American female who is currently still lethargic today but easily arousable to go to verbal stimuli.  Unable to provide subjective history and does not follow commands.  Still is very  incomprehensible Eyes: Sclera anicteric and conjunctive are normal ENMT: External ears and nose appear normal Neck: Supple with no JVD Respiratory: Diminished to auscultation bilaterally and no appreciable wheezing, rales, rhonchi.  Patient is not tachypneic or using accessory muscles to breathe Cardiovascular: Regular rate and rhythm.  No appreciable murmurs, rubs, gallops. Abdomen: Soft, nontender, nondistended.  Bowel sounds present in all 4 quadrants slightly hyperactive GU: Deferred Musculoskeletal: Left leg is immobilized from a hip fracture Skin: Warm and dry.  He does have a large sacral bedsore and wound that is covered with Mepilex. Neurologic: Does not follow commands today.  And is incomprehensible and difficult to understand what she is saying. Psychiatric: Impaired judgment and insight.  Appears calm but does appear lethargic and somnolent but easily arousable  Data Reviewed: I have personally reviewed following labs and imaging studies  CBC: Recent Labs  Lab 09/26/17 1139 09/27/17 1017 09/28/17 0652 09/29/17 0547 09/30/17 0418  WBC 10.5 11.0* 8.6 6.8 6.6  NEUTROABS 8.5* 9.1* 6.4 4.7 4.3  HGB 10.8* 12.2 9.6* 9.1* 8.8*  HCT 35.3* 40.2 32.2* 29.1* 27.8*  MCV 98.1 97.1 97.0 94.5 94.6  PLT 237 215 198 213 213   Basic Metabolic Panel: Recent Labs  Lab 09/26/17 1139 09/27/17 0500 09/27/17 1017 09/28/17 0652 09/29/17 0547 09/30/17 0418  NA 154* 149*  --  137 136 142  K 3.7 3.3*  --  3.8 3.1* 2.9*  CL 115* 108  --  106 105 108  CO2 24 25  --  21* 20* 25  GLUCOSE 126* 156*  --  161* 109* 103*  BUN 35* 29*  --  21* 14 13  CREATININE 1.23* 0.96  --  0.70 0.64 0.62  CALCIUM 8.6* 8.9  --  7.9* 8.1* 8.4*  MG  --   --  1.7 1.4* 2.0 1.8  PHOS  --   --  2.2* 2.6 2.0* 2.1*   GFR: Estimated Creatinine Clearance: 35 mL/min (by C-G formula based on SCr of 0.62 mg/dL). Liver Function Tests: Recent Labs  Lab 09/26/17 1139 09/27/17 0500 09/28/17 0652 09/29/17 0547  09/30/17 0418  AST 20 27 23 18 17   ALT 7* 10* 7* 8* 9*  ALKPHOS 106 112 86 83 81  BILITOT 0.7 0.8 0.8 0.4 0.6  PROT 7.5 8.0 6.1* 6.1* 6.1*  ALBUMIN 2.8* 3.0* 2.2* 2.3* 2.2*   No results for input(s): LIPASE, AMYLASE in the last 168 hours. No results for input(s): AMMONIA in the last 168 hours. Coagulation Profile: Recent Labs  Lab 09/26/17 1139 09/27/17 0500  INR 1.24 1.23   Cardiac Enzymes: Recent Labs  Lab 09/26/17 1139  TROPONINI 0.11*   BNP (last 3 results) No results for input(s):  PROBNP in the last 8760 hours. HbA1C: No results for input(s): HGBA1C in the last 72 hours. CBG: No results for input(s): GLUCAP in the last 168 hours. Lipid Profile: No results for input(s): CHOL, HDL, LDLCALC, TRIG, CHOLHDL, LDLDIRECT in the last 72 hours. Thyroid Function Tests: No results for input(s): TSH, T4TOTAL, FREET4, T3FREE, THYROIDAB in the last 72 hours. Anemia Panel: Recent Labs    09/29/17 1330 09/29/17 1334 09/29/17 1750  VITAMINB12  --   --  1,038*  FOLATE 8.3  --   --   FERRITIN  --   --  742*  TIBC  --   --  120*  IRON  --   --  28  RETICCTPCT  --  2.0  --    Sepsis Labs: Recent Labs  Lab 09/26/17 1140  LATICACIDVEN 1.59    Recent Results (from the past 240 hour(s))  Culture, blood (routine x 2)     Status: None (Preliminary result)   Collection Time: 09/26/17 11:38 AM  Result Value Ref Range Status   Specimen Description   Final    BLOOD RIGHT FOREARM Performed at [facility], [address], [place], [place] [address]    Special Requests   Final    BOTTLES DRAWN AEROBIC AND ANAEROBIC Blood Culture adequate volume Performed at [facility], [address], [place], [place] [address]    Culture   Final    NO GROWTH 4 DAYS Performed at [facility], [address], [place], [place] [address]    Report Status PENDING  Incomplete  Urine culture     Status: Abnormal   Collection Time: 09/26/17 11:38 AM    Result Value Ref Range Status   Specimen Description   Final    URINE, CLEAN CATCH Performed at [facility], [address], [place], [place] [address]    Special Requests   Final    NONE Performed at [facility], [address], [place], [place] [address]    Culture (A)  Final    <10,000 COLONIES/mL INSIGNIFICANT GROWTH Performed at [facility], [address], [place], [place] [address]    Report Status 09/27/2017 FINAL  Final  Culture, blood (routine x 2)     Status: None (Preliminary result)   Collection Time: 09/26/17 11:39 AM  Result Value Ref Range Status   Specimen Description   Final    BLOOD RIGHT ANTECUBITAL Performed at [facility], [address] [place], [place] [address]    Special Requests   Final    BOTTLES DRAWN AEROBIC ONLY Blood Culture results may not be optimal due to an inadequate volume of blood received in culture bottles Performed at [facility], [address], [place], [place] [address]    Culture   Final    NO GROWTH 4 DAYS Performed at [facility], [address] [place], [place] [address]    Report Status PENDING  Incomplete  Gastrointestinal Panel by PCR , Stool     Status: None   Collection Time: 09/26/17  2:06 PM  Result Value Ref Range Status   Campylobacter species NOT DETECTED NOT DETECTED Final   Plesimonas shigelloides NOT DETECTED NOT DETECTED Final   Salmonella species NOT DETECTED NOT DETECTED Final   Yersinia enterocolitica NOT DETECTED NOT DETECTED Final   Vibrio species NOT DETECTED NOT DETECTED Final   Vibrio cholerae NOT DETECTED NOT DETECTED Final  Enteroaggregative E coli (EAEC) NOT DETECTED NOT DETECTED Final   Enteropathogenic E coli (EPEC) NOT DETECTED NOT DETECTED Final   Enterotoxigenic E coli (ETEC) NOT DETECTED NOT DETECTED Final   Shiga like toxin producing E coli (STEC) NOT DETECTED NOT DETECTED Final    Shigella/Enteroinvasive E coli (EIEC) NOT DETECTED NOT DETECTED Final   Cryptosporidium NOT DETECTED NOT DETECTED Final   Cyclospora cayetanensis NOT DETECTED NOT DETECTED Final   Entamoeba histolytica NOT DETECTED NOT DETECTED Final   Giardia lamblia NOT DETECTED NOT DETECTED Final   Adenovirus F40/41 NOT DETECTED NOT DETECTED Final   Astrovirus NOT DETECTED NOT DETECTED Final   Norovirus GI/GII NOT DETECTED NOT DETECTED Final   Rotavirus A NOT DETECTED NOT DETECTED Final   Sapovirus (I, II, IV, and V) NOT DETECTED NOT DETECTED Final    Comment: Performed at [facility], [address] [place], [place] [address]  C difficile quick scan w PCR reflex     Status: None   Collection Time: 09/26/17  2:06 PM  Result Value Ref Range Status   C Diff antigen NEGATIVE NEGATIVE Final   C Diff toxin NEGATIVE NEGATIVE Final   C Diff interpretation No C. difficile detected.  Final    Comment: Performed at [facility], [address], [place], [place] [address]    Radiology Studies: No results found. Scheduled Meds: . collagenase   Topical Daily  . enoxaparin (LOVENOX) injection  40 mg Subcutaneous Q24H   Continuous Infusions: . ceFEPime (MAXIPIME) IV Stopped (09/29/17 2121)  . dextrose Stopped (09/29/17 1450)  . potassium chloride Stopped (09/30/17 1355)  . potassium PHOSPHATE IVPB (mmol)      LOS: 4 days   Merlene Laughter, DO [facility] Pager [phone]  If 7PM-7AM, please contact night-coverage [email or web address] Password [email or web address] 09/30/2017, 2:28 PM

## 2017-09-30 NOTE — Care Management Important Message (Signed)
Important Message  Patient Details  Name: Sherri Leonard MRN: [id number] Date of Birth: 09-05-1920   Medicare Important Message Given:  Yes    Caren Macadam 09/30/2017, 11:49 AMImportant Message  Patient Details  Name: Sherri Leonard MRN: [id number] Date of Birth: [birth year]   Medicare Important Message Given:  Yes    Caren Macadam 09/30/2017, 11:48 AM

## 2017-09-30 NOTE — Care Management Note (Signed)
Case Management Note  Patient Details  Name: Sherri Leonard MRN: [id number] Date of Birth: [birth year]  Subjective/Objective: 84 y/o f admitted w/AKI. From SNF-[facility]. Readmit. PT-no PT f/u-recc higher level. CSW following. PMT cons-await recc.                   Action/Plan:dc SNF.   Expected Discharge Date:  (unknown)               Expected Discharge Plan:  Skilled Nursing Facility  In-House Referral:  Clinical Social Work  Discharge planning Services     Post Acute Care Choice:    Choice offered to:     DME Arranged:    DME Agency:     HH Arranged:    HH Agency:     Status of Service:  In process, will continue to follow  If discussed at Long Length of Stay Meetings, dates discussed:    Additional Comments:  Lanier Clam, RN 09/30/2017, 11:31 AM

## 2017-09-30 NOTE — Progress Notes (Signed)
SLP Cancellation Note  Patient Details Name: Sherri Leonard MRN: [id number] DOB: 01-Dec-1920   Cancelled treatment:       Reason Eval/Treat Not Completed: Fatigue/lethargy limiting ability to participate. Per RN, pt is insufficiently arousable for po intake, and has been sleeping for most of this shift. Unable to assess tolerance of dys 1 (puree) and honey thick liquids for that reason. RN indicates Palliative Care meeting is scheduled for tomorrow. SLP will follow up after GOC established.  [name] B. Murvin [facility], CCC-SLP Speech Language Pathologist [phone]  [name] 09/30/2017, 2:56 PM

## 2017-10-01 LAB — CULTURE, BLOOD (ROUTINE X 2)
CULTURE: NO GROWTH
Culture: NO GROWTH
Special Requests: ADEQUATE

## 2017-10-01 LAB — CBC WITH DIFFERENTIAL/PLATELET
BASOS ABS: 0.1 10*3/uL (ref 0.0–0.1)
Basophils Relative: 1 %
Eosinophils Absolute: 0.3 10*3/uL (ref 0.0–0.7)
Eosinophils Relative: 4 %
HEMATOCRIT: 29.6 % — AB (ref 36.0–46.0)
Hemoglobin: 9.4 g/dL — ABNORMAL LOW (ref 12.0–15.0)
LYMPHS PCT: 19 %
Lymphs Abs: 1.5 10*3/uL (ref 0.7–4.0)
MCH: 29.7 pg (ref 26.0–34.0)
MCHC: 31.8 g/dL (ref 30.0–36.0)
MCV: 93.7 fL (ref 78.0–100.0)
MONO ABS: 0.6 10*3/uL (ref 0.1–1.0)
MONOS PCT: 7 %
NEUTROS ABS: 5.4 10*3/uL (ref 1.7–7.7)
Neutrophils Relative %: 69 %
Platelets: 236 10*3/uL (ref 150–400)
RBC: 3.16 MIL/uL — ABNORMAL LOW (ref 3.87–5.11)
RDW: 15.4 % (ref 11.5–15.5)
WBC: 7.8 10*3/uL (ref 4.0–10.5)

## 2017-10-01 LAB — COMPREHENSIVE METABOLIC PANEL
ALK PHOS: 84 U/L (ref 38–126)
ALT: 7 U/L — ABNORMAL LOW (ref 14–54)
AST: 18 U/L (ref 15–41)
Albumin: 2.4 g/dL — ABNORMAL LOW (ref 3.5–5.0)
Anion gap: 9 (ref 5–15)
BILIRUBIN TOTAL: 0.7 mg/dL (ref 0.3–1.2)
BUN: 10 mg/dL (ref 6–20)
CALCIUM: 8.3 mg/dL — AB (ref 8.9–10.3)
CO2: 22 mmol/L (ref 22–32)
Chloride: 105 mmol/L (ref 101–111)
Creatinine, Ser: 0.54 mg/dL (ref 0.44–1.00)
GFR calc Af Amer: >60 mL/min (ref >60)
Glucose, Bld: 115 mg/dL — ABNORMAL HIGH (ref 65–99)
POTASSIUM: 4.1 mmol/L (ref 3.5–5.1)
Sodium: 136 mmol/L (ref 135–145)
TOTAL PROTEIN: 6.4 g/dL — AB (ref 6.5–8.1)

## 2017-10-01 LAB — MAGNESIUM: MAGNESIUM: 1.6 mg/dL — AB (ref 1.7–2.4)

## 2017-10-01 LAB — PHOSPHORUS: Phosphorus: 2.9 mg/dL (ref 2.5–4.6)

## 2017-10-01 MED ORDER — HALOPERIDOL LACTATE 5 MG/ML IJ SOLN
0.5000 mg | Freq: Once | INTRAMUSCULAR | Status: AC
  Administered 2017-10-02: 0.5 mg via INTRAVENOUS
  Filled 2017-10-01 (×2): qty 1

## 2017-10-01 MED ORDER — MAGNESIUM SULFATE 2 GM/50ML IV SOLN
2.0000 g | Freq: Once | INTRAVENOUS | Status: AC
  Administered 2017-10-01: 2 g via INTRAVENOUS
  Filled 2017-10-01: qty 50

## 2017-10-01 MED ORDER — HYDRALAZINE HCL 20 MG/ML IJ SOLN
5.0000 mg | Freq: Once | INTRAMUSCULAR | Status: AC
Start: 2017-10-01 — End: 2017-10-01
  Administered 2017-10-01: 5 mg via INTRAVENOUS
  Filled 2017-10-01: qty 1

## 2017-10-01 NOTE — Progress Notes (Signed)
PROGRESS NOTE  Sherri Leonard ZOX:[id number] DOB: 1920/11/13 DOA: 09/26/2017 PCP: Sherron Monday, MD  HPI/Recap of past 48 hours: 84 year old female from SNF with a PMH of Sick sinus syndrome with pacemaker, possible Parkinson's, bipolar disorder, anemia, Osteoarthritis, who was recently Admitted 4/16-4/22 for a Left Hip fracture and d/c to SNF.Pt returns from [facility] after having been rehabilitated there and was found to be floridly septic and dehydrated-she had a T-max of 100 was unable to provide any meaningful history. CODE Status was changed to DNR and palliative care consulted, had a meeting with daughter 10/01/17 and patient was made hospice with comfort care.   Today, patient not responding to questions, sleepy, in no distress.  Currently comfort care, awaiting hospice placement    Assessment/Plan: Active Problems:   Anemia   Dementia without behavioral disturbance   Depression due to dementia   Acute kidney injury (HCC)   Pressure injury of skin   Sepsis (HCC)   Hypernatremia   Hypokalemia   Hypophosphatemia  Sepsis 2/2 unclear etiology, likely sacral wound infection Currently afebrile, no leukocytosis Blood Cultures x2 showed NGTD U/A showed few bacteria, 6-10 WBCs Urine culture less than 10,000 colonies of insignificant growth Chest x-ray showed no active disease Palliative consulted, pt is currently hospice with comfort care  AKI Resolved   Hypernatremia/hypokalemia/hypophosphatemia/hypomagnesemia Improved   Toxic Metabolic Encephalopathy Likely 2/2 to polypharmacy and sepsis CT head was somewhat limited exam due to patient motion but showed chronic atrophic and ischemic changes without acute abnormality  Type 2 NSTEMI Troponin mildly elevated at 0.11 in the setting of AKI and suspected infection and it would not change management in someone with Dementia and Advanced Age  Chronic bundle branch block with sick sinus  syndrome Stable at this time  Bedsore/Sacral Wound poA  Continue wound care WOC on board  Normocytic Anemia Iron panel showed iron level of 28, UIBC of 92, TIBC of 120, saturation ratios of 20%, ferritin level 742, folate level of 8.3, and vitamin B12 of 1,038    Code Status: DNR  Family Communication: Spoke with granddaughter at bedside  Disposition Plan: Hospice, awaiting bed placement   Consultants:  Palliative  Procedures:  None  Antimicrobials:  Cefepime  DVT prophylaxis: None   Objective: Vitals:   09/30/17 1311 09/30/17 2234 10/01/17 0443 10/01/17 1413  BP: (!) 157/83 (!) 141/93 124/87 (!) 145/82  Pulse: 69 76 72 80  Resp: Temp: 98.2 F (36.8 C) 97.8 F (36.6 C) 98.1 F (36.7 C) 99.1 F (37.3 C)  TempSrc: Oral Oral Oral Oral  SpO2: 91% 91% 91% 99%  Weight:      Height:        Intake/Output Summary (Last 24 hours) at 10/01/2017 1808 Last data filed at 10/01/2017 1150 Gross per 24 hour  Intake 400 ml  Output 750 ml  Net -350 ml   Filed Weights   09/26/17 1501 09/26/17 1841  Weight: 56.7 kg (125 lb) 51.6 kg (113 lb 12.8 oz)    Exam:   General: NAD, chronically ill-appearing, lethargic  Cardiovascular: S1-S2 present  Respiratory: Diminished breath sounds bilaterally  Abdomen: Soft, nontender, nondistended, bowel sounds present  Musculoskeletal: No pedal edema bilaterally, left leg is immobilized from a hip fracture  Skin: Large sacral bedsore, dressing clean dry and intact  Psychiatry: Unable to assess   Data Reviewed: CBC: Recent Labs  Lab 09/27/17 1017 09/28/17 0652 09/29/17 0547 09/30/17 0418 10/01/17 0436  WBC 11.0* 8.6 6.8  6.6 7.8  NEUTROABS 9.1* 6.4 4.7 4.3 5.4  HGB 12.2 9.6* 9.1* 8.8* 9.4*  HCT 40.2 32.2* 29.1* 27.8* 29.6*  MCV 97.1 97.0 94.5 94.6 93.7  PLT 215 198 213 213 236   Basic Metabolic Panel: Recent Labs  Lab 09/27/17 0500 09/27/17 1017 09/28/17 0652 09/29/17 0547 09/30/17 0418  10/01/17 0436  NA 149*  --  137 136 142 136  K 3.3*  --  3.8 3.1* 2.9* 4.1  CL 108  --  106 105 108 105  CO2 25  --  21* 20* 25 22  GLUCOSE 156*  --  161* 109* 103* 115*  BUN 29*  --  21* CREATININE 0.96  --  0.70 0.64 0.62 0.54  CALCIUM 8.9  --  7.9* 8.1* 8.4* 8.3*  MG  --  1.7 1.4* 2.0 1.8 1.6*  PHOS  --  2.2* 2.6 2.0* 2.1* 2.9   GFR: Estimated Creatinine Clearance: 35 mL/min (by C-G formula based on SCr of 0.54 mg/dL). Liver Function Tests: Recent Labs  Lab 09/27/17 0500 09/28/17 0652 09/29/17 0547 09/30/17 0418 10/01/17 0436  AST ALT 10* 7* 8* 9* 7*  ALKPHOS 112 86 83 81 84  BILITOT 0.8 0.8 0.4 0.6 0.7  PROT 8.0 6.1* 6.1* 6.1* 6.4*  ALBUMIN 3.0* 2.2* 2.3* 2.2* 2.4*   No results for input(s): LIPASE, AMYLASE in the last 168 hours. No results for input(s): AMMONIA in the last 168 hours. Coagulation Profile: Recent Labs  Lab 09/26/17 1139 09/27/17 0500  INR 1.24 1.23   Cardiac Enzymes: Recent Labs  Lab 09/26/17 1139  TROPONINI 0.11*   BNP (last 3 results) No results for input(s): PROBNP in the last 8760 hours. HbA1C: No results for input(s): HGBA1C in the last 72 hours. CBG: No results for input(s): GLUCAP in the last 168 hours. Lipid Profile: No results for input(s): CHOL, HDL, LDLCALC, TRIG, CHOLHDL, LDLDIRECT in the last 72 hours. Thyroid Function Tests: No results for input(s): TSH, T4TOTAL, FREET4, T3FREE, THYROIDAB in the last 72 hours. Anemia Panel: Recent Labs    09/29/17 1330 09/29/17 1334 09/29/17 1750  VITAMINB12  --   --  1,038*  FOLATE 8.3  --   --   FERRITIN  --   --  742*  TIBC  --   --  120*  IRON  --   --  28  RETICCTPCT  --  2.0  --    Urine analysis:    Component Value Date/Time   COLORURINE AMBER (A) 09/26/2017 1138   APPEARANCEUR CLOUDY (A) 09/26/2017 1138   LABSPEC 1.023 09/26/2017 1138   PHURINE 5.0 09/26/2017 1138   GLUCOSEU NEGATIVE 09/26/2017 1138   HGBUR MODERATE (A) 09/26/2017 1138    BILIRUBINUR SMALL (A) 09/26/2017 1138   KETONESUR 5 (A) 09/26/2017 1138   PROTEINUR 100 (A) 09/26/2017 1138   UROBILINOGEN 0.2 08/24/2014 1410   NITRITE NEGATIVE 09/26/2017 1138   LEUKOCYTESUR NEGATIVE 09/26/2017 1138   Sepsis Labs: (procalcitonin:4,lacticidven:4)  ) Recent Results (from the past 240 hour(s))  Culture, blood (routine x 2)     Status: None   Collection Time: 09/26/17 11:38 AM  Result Value Ref Range Status   Specimen Description   Final    BLOOD RIGHT FOREARM Performed at [facility], [address], [place], [place] [address]    Special Requests   Final    BOTTLES DRAWN AEROBIC AND ANAEROBIC Blood Culture adequate volume Performed at  [facility], [address], [place], [place] [address]    Culture   Final    NO GROWTH 5 DAYS Performed at [facility], [address], [place], [place] [address]    Report Status 10/01/2017 FINAL  Final  Urine culture     Status: Abnormal   Collection Time: 09/26/17 11:38 AM  Result Value Ref Range Status   Specimen Description   Final    URINE, CLEAN CATCH Performed at [facility], [address], [place], [place] [address]    Special Requests   Final    NONE Performed at [facility], [address] [place], [place] [address]    Culture (A)  Final    <10,000 COLONIES/mL INSIGNIFICANT GROWTH Performed at [facility], [address], [place], [place] [address]    Report Status 09/27/2017 FINAL  Final  Culture, blood (routine x 2)     Status: None   Collection Time: 09/26/17 11:39 AM  Result Value Ref Range Status   Specimen Description   Final    BLOOD RIGHT ANTECUBITAL Performed at [facility], [address] [place], [place] [address]    Special Requests   Final    BOTTLES DRAWN AEROBIC ONLY Blood Culture results may not be optimal due to an inadequate volume of blood received in culture  bottles Performed at [facility], [address], [place], [place] [address]    Culture   Final    NO GROWTH 5 DAYS Performed at [facility], [address], [place], [place] [address]    Report Status 10/01/2017 FINAL  Final  Gastrointestinal Panel by PCR , Stool     Status: None   Collection Time: 09/26/17  2:06 PM  Result Value Ref Range Status   Campylobacter species NOT DETECTED NOT DETECTED Final   Plesimonas shigelloides NOT DETECTED NOT DETECTED Final   Salmonella species NOT DETECTED NOT DETECTED Final   Yersinia enterocolitica NOT DETECTED NOT DETECTED Final   Vibrio species NOT DETECTED NOT DETECTED Final   Vibrio cholerae NOT DETECTED NOT DETECTED Final   Enteroaggregative E coli (EAEC) NOT DETECTED NOT DETECTED Final   Enteropathogenic E coli (EPEC) NOT DETECTED NOT DETECTED Final   Enterotoxigenic E coli (ETEC) NOT DETECTED NOT DETECTED Final   Shiga like toxin producing E coli (STEC) NOT DETECTED NOT DETECTED Final   Shigella/Enteroinvasive E coli (EIEC) NOT DETECTED NOT DETECTED Final   Cryptosporidium NOT DETECTED NOT DETECTED Final   Cyclospora cayetanensis NOT DETECTED NOT DETECTED Final   Entamoeba histolytica NOT DETECTED NOT DETECTED Final   Giardia lamblia NOT DETECTED NOT DETECTED Final   Adenovirus F40/41 NOT DETECTED NOT DETECTED Final   Astrovirus NOT DETECTED NOT DETECTED Final   Norovirus GI/GII NOT DETECTED NOT DETECTED Final   Rotavirus A NOT DETECTED NOT DETECTED Final   Sapovirus (I, II, IV, and V) NOT DETECTED NOT DETECTED Final    Comment: Performed at [facility], [address] [place], [place] [address]  C difficile quick scan w PCR reflex     Status: None   Collection Time: 09/26/17  2:06 PM  Result Value Ref Range Status   C Diff antigen NEGATIVE NEGATIVE Final   C Diff toxin NEGATIVE NEGATIVE Final   C Diff interpretation No C. difficile detected.  Final    Comment: Performed at [facility], [address] [place], [place]  [address]      Studies: No results found.  Scheduled Meds: . collagenase   Topical Daily    Continuous Infusions: . ceFEPime (MAXIPIME) IV 2 g (10/01/17 1528)     LOS: 5 days     Briant Cedar, MD [facility]   If 7PM-7AM, please contact night-coverage [email or web address] Password [facility] 10/01/2017, 6:08 PM

## 2017-10-01 NOTE — Progress Notes (Signed)
PMT progress note  [name] is resting in bed, she is an elderly lady, in no distress, when touched/wound on her sacrum examined, she winces and grimaces.   Her grand daughter Sherri Leonard is at the bedside. I introduced myself and palliative care as follows: Palliative medicine is specialized medical care for people living with serious illness. It focuses on providing relief from the symptoms and stress of a serious illness. The goal is to improve quality of life for both the patient and the family.  BP 124/87 (BP Location: Right Arm)   Pulse 72   Temp 98.1 F (36.7 C) (Oral)   Resp 18   Ht  (1.651 m)   Wt 51.6 kg (113 lb 12.8 oz)   SpO2 91%   BMI 18.94 kg/m  Labs and imaging noted Chart reviewed.   Elderly lady weak resting in bed Sacral wound noted, dressing noted Has some edema Extremities warm to touch, no coolness no mottling Shallow regular respirations S1 S2 Abdomen not distended   Life history:  Patient is described as a [place] lady, husband died in 09/24/06, they'd been married for 60+ years. Patient was in a facility since 09/24/14, broke her hop recently, was at [facility] rehab. Has had ongoing decline.   Grand daughter Sherri Leonard states that she'd been planning to have a big 89th birthday celebration for the patient this year, we discussed frankly and compassionately that the patient likely has a prognosis of days to some very limited number of weeks, and hence, appropriate for hospice/comfort measures.   Plan: CSW consult for residential hospice.   35 minutes spent Rosalin Hawking MD  [facility] health palliative medicine [phone]

## 2017-10-01 NOTE — Progress Notes (Signed)
CSW contacted patient's granddaughter Rital Cavey [phone]) to discuss patient's discharge planning, phone rung once and went to voicemail. Voicemail box full, no option to leave voicemail. Patient has no family at bedside. CSW will continue to try to reach patient's granddaughter to discuss discharge planning.  Celso Sickle, [place] Clinical Social Worker [facility] Cell#: [phone]

## 2017-10-01 NOTE — Progress Notes (Signed)
CSW consulted for residential hospice placement.   CSW contacted patient's granddaughter [name] [phone]) to discuss consult for residential hospice, no answer nor option to leave a voicemail.  CSW will attempt to reach patient's granddaughter again to discuss residential hospice consult.  Celso Sickle, [place] Clinical Social Worker [facility] Cell#: [phone]

## 2017-10-01 NOTE — Progress Notes (Signed)
SLP Cancellation Note  Patient Details Name: Sherri Leonard MRN: [id number] DOB: 1921/01/02   Cancelled treatment:       Reason Eval/Treat Not Completed: Other (comment) Pt shifting to full comfort/hospice.  SLP services will respectfully sign off.   Blenda Mounts Laurice 10/01/2017, 12:34 PM

## 2017-10-01 NOTE — Progress Notes (Signed)
   10/01/17 1500  Clinical Encounter Type  Visited With Patient  Visit Type Follow-up  Spiritual Encounters  Spiritual Needs Prayer   Following up on a visit from Monday.  Patient was resting and I call her name, but she seemed at peace.  Kneeled down and provided a prayer for her.  Will follow and support as needed. Chaplain Agustin Cree

## 2017-10-02 MED ORDER — COLLAGENASE 250 UNIT/GM EX OINT: TOPICAL_OINTMENT | Freq: Every day | CUTANEOUS | 0 refills | Status: AC

## 2017-10-02 MED ORDER — ACETAMINOPHEN 650 MG RE SUPP: 650.0000 mg | Freq: Four times a day (QID) | RECTAL | 0 refills | Status: AC | PRN

## 2017-10-02 NOTE — Consult Note (Signed)
[facility] [place]:  Spoke to [place] daughter who agreed to meet me at 1100am to discuss Hospice care and comfort approach for her mother at [facility] [employer]. The pt is noted to moan and groan while visiting her off and on. Long discussion about comfort approach and end of life care for her wound, aspiration and her not eating and drinking IVF and PEG tube. She does agree to end of life care and comfort approach stating she would not want to put her grand mother through any more because she has no quality of life. While I was talking to my Dr. To get pt approved to move to the [facility] the grand daughter left. Telling one of the nurses here that she had to go and to let me know. I called her and she is not available to meet me know to sign the paperwork to go to the [facility]. After discussion she is in agreement to sign them via email and send them back. WE have emailed forms to her. As soon as we get the forms returned I will let the [facility] [place] [facility] know and she will set up ambulance.  Thank yo for this referral. I am sorry for the delay of getting her discharged from hospital. Norm Parcel RN [phone]

## 2017-10-02 NOTE — Discharge Summary (Signed)
Discharge Summary  Sherri Leonard ZOX:[id number] DOB: 26-Oct-1920  PCP: Sherron Monday, MD  Admit date: 09/26/2017 Discharge date: 10/02/2017  Time spent: 40 mins  Recommendations for Outpatient Follow-up:  1. Hospice   Discharge Diagnoses:  Active Hospital Problems   Diagnosis Date Noted  . Sepsis (HCC) 09/29/2017  . Hypernatremia 09/29/2017  . Hypokalemia 09/29/2017  . Hypophosphatemia 09/29/2017  . Pressure injury of skin 09/27/2017  . Acute kidney injury (HCC) 09/26/2017  . Dementia without behavioral disturbance 04/10/2015  . Depression due to dementia 04/10/2015  . Anemia 04/06/2015    Resolved Hospital Problems  No resolved problems to display.    Discharge Condition: Fair  Diet recommendation: Defer to hospice   Vitals:   10/01/17 2301 10/02/17 0546  BP: (!) 174/114 110/61  Pulse:  79  Resp:  18  Temp:  (!) 97.4 F (36.3 C)  SpO2:  100%    History of present illness:  84 year old female from SNF with a PMH of Sick sinus syndrome with pacemaker, possible Parkinson's, bipolar disorder, anemia, Osteoarthritis, who was recently Admitted 4/16-4/22 for a Left Hip fracture and d/c to SNF.Pt returns from [facility] nursing facility after having been rehabilitated there and was found to be floridly septic and dehydrated-she had a T-max of 100 was unable to provide any meaningful history. CODE Status was changed to DNR and palliative care consulted, had a meeting with daughter 10/01/17 and patient was made hospice with comfort care.  Today, patient not responding to questions. Discharged to hospice.   Hospital Course:  Active Problems:   Anemia   Dementia without behavioral disturbance   Depression due to dementia   Acute kidney injury (HCC)   Pressure injury of skin   Sepsis (HCC)   Hypernatremia   Hypokalemia   Hypophosphatemia  Sepsis 2/2 unclear etiology, likely sacral wound infection Currently afebrile, no leukocytosis Blood Cultures x2 showed  NGTD U/A showed few bacteria, 6-10 WBCs Urine culture less than 10,000 colonies of insignificant growth Chest x-ray showed no active disease Palliative consulted, pt is currently hospice with comfort care  AKI Resolved   Hypernatremia/hypokalemia/hypophosphatemia/hypomagnesemia Improved  Toxic Metabolic Encephalopathy Likely 2/2 to polypharmacy and sepsis CT head was somewhat limited exam due to patient motion but showed chronic atrophic and ischemic changes without acute abnormality  Type 2 NSTEMI Troponin mildly elevated at 0.11 in the setting of AKI and suspected infection and it would not change management in someone with Dementia and Advanced Age  Chronic bundle branch block with sick sinus syndrome Stable at this time  [place]/Sacral Wound poA Continue wound care WOC on board  Normocytic Anemia Iron panel showediron level of 28, UIBC of 92, TIBC of 120, saturation ratios of 20%, ferritin level 742, folate level of 8.3,and vitamin B12 of 1,038    Procedures:  None  Consultations:  Palliative  Discharge Exam: BP 110/61 (BP Location: Right Wrist)   Pulse 79   Temp (!) 97.4 F (36.3 C) (Oral)   Resp 18   Ht  (1.651 m)   Wt 51.6 kg (113 lb 12.8 oz)   SpO2 100%   BMI 18.94 kg/m   General: NAD Cardiovascular: S1-S2 present  Respiratory: Poor respiratory effort   Discharge Instructions You were cared for by a hospitalist during your hospital stay. If you have any questions about your discharge medications or the care you received while you were in the hospital after you are discharged, you can call the unit and asked to speak with the hospitalist  on call if the hospitalist that took care of you is not available. Once you are discharged, your primary care physician will handle any further medical issues. Please note that NO REFILLS for any discharge medications will be authorized once you are discharged, as it is imperative that you return to your  primary care physician (or establish a relationship with a primary care physician if you do not have one) for your aftercare needs so that they can reassess your need for medications and monitor your lab values.  Discharge Instructions    Diet - low sodium heart healthy   Complete by:  As directed    Increase activity slowly   Complete by:  As directed      Allergies as of 10/02/2017      Reactions   Lorazepam Other (See Comments)   Causes violence  Other reaction(s): Agitation   Benzodiazepines       Medication List    STOP taking these medications   aspirin 325 MG tablet   baclofen 10 MG tablet Commonly known as:  LIORESAL   BIOFREEZE 4 % Gel Generic drug:  Menthol (Topical Analgesic)   bisacodyl 10 MG suppository Commonly known as:  DULCOLAX   cholecalciferol 1000 units tablet Commonly known as:  VITAMIN D   docusate 50 MG/5ML liquid Commonly known as:  COLACE   ENSURE   Melatonin 3 MG Tabs   polyethylene glycol packet Commonly known as:  MIRALAX / GLYCOLAX   sertraline 100 MG tablet Commonly known as:  ZOLOFT   traMADol 50 MG tablet Commonly known as:  ULTRAM   TYLENOL 325 MG tablet Generic drug:  acetaminophen Replaced by:  acetaminophen 650 MG suppository     TAKE these medications   acetaminophen 650 MG suppository Commonly known as:  TYLENOL Place 1 suppository (650 mg total) rectally every 6 (six) hours as needed for mild pain (or Fever >/= 101). Replaces:  TYLENOL 325 MG tablet   collagenase ointment Commonly known as:  SANTYL Apply topically daily. Start taking on:  10/03/2017      Allergies  Allergen Reactions  . Lorazepam Other (See Comments)    Causes violence  Other reaction(s): Agitation  . Benzodiazepines       The results of significant diagnostics from this hospitalization (including imaging, microbiology, ancillary and laboratory) are listed below for reference.    Significant Diagnostic Studies: Dg Chest 1  View  Result Date: 09/26/2017 CLINICAL DATA:  Altered mental status. EXAM: CHEST  1 VIEW COMPARISON:  Chest x-ray dated September 02, 2017. FINDINGS: Unchanged left chest wall pacemaker. The heart size and mediastinal contours are within normal limits. Normal pulmonary vascularity. No focal consolidation, pleural effusion, or pneumothorax. Unchanged elevation of the right hemidiaphragm. IMPRESSION: No active disease. Electronically Signed   By: Obie Dredge M.D.   On: 09/26/2017 12:19   Ct Head Wo Contrast  Result Date: 09/26/2017 CLINICAL DATA:  Altered level of consciousness EXAM: CT HEAD WITHOUT CONTRAST TECHNIQUE: Contiguous axial images were obtained from the base of the skull through the vertex without intravenous contrast. The examination is significantly limited by patient motion artifact. COMPARISON:  04/10/2017 FINDINGS: Brain: Chronic atrophic and white matter ischemic changes are seen similar to that noted on the prior exam. No findings to suggest acute hemorrhage, acute infarction or space-occupying mass lesion are noted. Vascular: No hyperdense vessel or unexpected calcification. Skull: Normal. Negative for fracture or focal lesion. Sinuses/Orbits: No acute finding. Other: None. IMPRESSION: Somewhat limited exam due to  patient motion. Chronic atrophic and ischemic changes without acute abnormality. Electronically Signed   By: Alcide Clever M.D.   On: 09/26/2017 13:14   Pelvis Portable  Result Date: 09/02/2017 CLINICAL DATA:  The patient suffered a left femoral neck fracture in a recent fall. Date of the fall is unknown. Status post left hip replacement. EXAM: PORTABLE PELVIS 1-2 VIEWS COMPARISON:  Plain films left hip 09/02/2017. FINDINGS: New left hip hemiarthroplasty is in place. The device is located. No fracture. Gas in the soft tissues from surgery noted. IMPRESSION: Status post left hip replacement.  No acute finding. Electronically Signed   By: Drusilla Kanner M.D.   On: 09/02/2017  16:14    Microbiology: Recent Results (from the past 240 hour(s))  Culture, blood (routine x 2)     Status: None   Collection Time: 09/26/17 11:38 AM  Result Value Ref Range Status   Specimen Description   Final    BLOOD RIGHT FOREARM Performed at [facility], [address], [place], [place] [address]    Special Requests   Final    BOTTLES DRAWN AEROBIC AND ANAEROBIC Blood Culture adequate volume Performed at [facility], [address], [place], [place] [address]    Culture   Final    NO GROWTH 5 DAYS Performed at [facility], [address], [place], [place] [address]    Report Status 10/01/2017 FINAL  Final  Urine culture     Status: Abnormal   Collection Time: 09/26/17 11:38 AM  Result Value Ref Range Status   Specimen Description   Final    URINE, CLEAN CATCH Performed at [facility], [address] [place] [address], [place], [place] [address]    Special Requests   Final    NONE Performed at [facility], [address], [place], [place] [address]    Culture (A)  Final    <10,000 COLONIES/mL INSIGNIFICANT GROWTH Performed at [facility], [address] [place], [place] [address]    Report Status 09/27/2017 FINAL  Final  Culture, blood (routine x 2)     Status: None   Collection Time: 09/26/17 11:39 AM  Result Value Ref Range Status   Specimen Description   Final    BLOOD RIGHT ANTECUBITAL Performed at [facility], [address], [place], [place] [address]    Special Requests   Final    BOTTLES DRAWN AEROBIC ONLY Blood Culture results may not be optimal due to an inadequate volume of blood received in culture bottles Performed at [facility], [address], [place], [place] [address]    Culture   Final    NO GROWTH 5 DAYS Performed at [facility], [address] [place], [place] [address]    Report Status 10/01/2017 FINAL  Final   Gastrointestinal Panel by PCR , Stool     Status: None   Collection Time: 09/26/17  2:06 PM  Result Value Ref Range Status   Campylobacter species NOT DETECTED NOT DETECTED Final   Plesimonas shigelloides NOT DETECTED NOT DETECTED Final   Salmonella species NOT DETECTED NOT DETECTED Final   Yersinia enterocolitica NOT DETECTED NOT DETECTED Final   Vibrio species NOT DETECTED NOT DETECTED Final   Vibrio cholerae NOT DETECTED NOT DETECTED Final   Enteroaggregative E coli (EAEC) NOT DETECTED NOT DETECTED Final   Enteropathogenic E coli (EPEC) NOT DETECTED NOT DETECTED Final   Enterotoxigenic E coli (ETEC)  NOT DETECTED NOT DETECTED Final   Shiga like toxin producing E coli (STEC) NOT DETECTED NOT DETECTED Final   Shigella/Enteroinvasive E coli (EIEC) NOT DETECTED NOT DETECTED Final   Cryptosporidium NOT DETECTED NOT DETECTED Final   Cyclospora cayetanensis NOT DETECTED NOT DETECTED Final   Entamoeba histolytica NOT DETECTED NOT DETECTED Final   Giardia lamblia NOT DETECTED NOT DETECTED Final   Adenovirus F40/41 NOT DETECTED NOT DETECTED Final   Astrovirus NOT DETECTED NOT DETECTED Final   Norovirus GI/GII NOT DETECTED NOT DETECTED Final   Rotavirus A NOT DETECTED NOT DETECTED Final   Sapovirus (I, II, IV, and V) NOT DETECTED NOT DETECTED Final    Comment: Performed at [facility], [address] [place], [place] [address]  C difficile quick scan w PCR reflex     Status: None   Collection Time: 09/26/17  2:06 PM  Result Value Ref Range Status   C Diff antigen NEGATIVE NEGATIVE Final   C Diff toxin NEGATIVE NEGATIVE Final   C Diff interpretation No C. difficile detected.  Final    Comment: Performed at [facility], [address], [place], [place] [address]     Labs: Basic Metabolic Panel: Recent Labs  Lab 09/27/17 0500 09/27/17 1017 09/28/17 0652 09/29/17 0547 09/30/17 0418 10/01/17 0436  NA 149*  --  137 136 142 136  K 3.3*  --  3.8 3.1* 2.9*  4.1  CL 108  --  106 105 108 105  CO2 25  --  21* 20* 25 22  GLUCOSE 156*  --  161* 109* 103* 115*  BUN 29*  --  21* CREATININE 0.96  --  0.70 0.64 0.62 0.54  CALCIUM 8.9  --  7.9* 8.1* 8.4* 8.3*  MG  --  1.7 1.4* 2.0 1.8 1.6*  PHOS  --  2.2* 2.6 2.0* 2.1* 2.9   Liver Function Tests: Recent Labs  Lab 09/27/17 0500 09/28/17 0652 09/29/17 0547 09/30/17 0418 10/01/17 0436  AST ALT 10* 7* 8* 9* 7*  ALKPHOS 112 86 83 81 84  BILITOT 0.8 0.8 0.4 0.6 0.7  PROT 8.0 6.1* 6.1* 6.1* 6.4*  ALBUMIN 3.0* 2.2* 2.3* 2.2* 2.4*   No results for input(s): LIPASE, AMYLASE in the last 168 hours. No results for input(s): AMMONIA in the last 168 hours. CBC: Recent Labs  Lab 09/27/17 1017 09/28/17 0652 09/29/17 0547 09/30/17 0418 10/01/17 0436  WBC 11.0* 8.6 6.8 6.6 7.8  NEUTROABS 9.1* 6.4 4.7 4.3 5.4  HGB 12.2 9.6* 9.1* 8.8* 9.4*  HCT 40.2 32.2* 29.1* 27.8* 29.6*  MCV 97.1 97.0 94.5 94.6 93.7  PLT 215 198 213 213 236   Cardiac Enzymes: Recent Labs  Lab 09/26/17 1139  TROPONINI 0.11*   BNP: BNP (last 3 results) No results for input(s): BNP in the last 8760 hours.  ProBNP (last 3 results) No results for input(s): PROBNP in the last 8760 hours.  CBG: No results for input(s): GLUCAP in the last 168 hours.     Signed:  Briant Cedar, MD [facility] 10/02/2017, 12:49 PM

## 2017-10-02 NOTE — Clinical Social Work Note (Signed)
Patient assessed on 09/05/17. Patient re-admitted with chief complaint of altered mental status. Palliative following and recommended residential hospice. CSW consulted for residential hospice. CSW spoke with patient's granddaughter Sherri Leonard [phone]), patient's granddaughter confirmed plan for patient to discharge to residential hospice. Patient's granddaughter reported that the first preference is Psychologist, sport and exercise and the second choice is [facility]. Patient's granddaughter requested that CSW make referral to a either facility that has bed available. CSW spoke with [facility] hospital liaison [place], she reported that they do not have beds available. CSW spoke with [facility], she reported that they have beds available. CSW made referral to [facility], hospital liaison agreed to follow up with patient's granddaughter. CSW will continue to follow and assist with discharge planning.   Clinical Social Work Assessment  Patient Details  Name: Sherri Leonard MRN: [id number] Date of Birth: [birth year]  Date of referral:                  Reason for consult:                   Permission sought to share information with:    Permission granted to share information::     Name::        Agency::     Relationship::     Contact Information:     Housing/Transportation Living arrangements for the past 2 months:    Source of Information:    Patient Interpreter Needed:    Criminal Activity/Legal Involvement Pertinent to Current Situation/Hospitalization:    Significant Relationships:    Lives with:    Do you feel safe going back to the place where you live?    Need for family participation in patient care:     Care giving concerns: Patient from [facility] (Sherri Leonard term care resident). Patient's daughter reported that patient encountered an undocumented fall at SNF and admitted with fracture. PT recommending  " Recommend return to SNF. Will let SNF therapy staff determine if they wish to resume PT trial with pt."    Social Worker assessment / plan:  CSW spoke with patient's granddaughter regarding discharge planning, patient disoriented x 4 and unable to participate in assessment. Patient's granddaughter reported that she prefers that patient get ST rehab at [facility]. CSW and patient's granddaughter discussed patient's participation with PT and PT reporting that patient's rehab potential is poor. Patient's granddaughter reported that patient was participating with PT at SNF prior to hospitalization and that she wants patient to work with PT after discharge. CSW explained that patient's insurance will likely not cover PT for patient due to patient's poor rehab potential, patient's granddaughter reported that she doesn't understand why patient was assessed by PT one day after surgery. CSW explained that PT sees patients and evaluates them when they are ready for evaluation. Patient's daughter requested to speak with patient's attending MD before discussing discharge plans further. Patient's granddaughter reported that she is not sure if she wants patient to return to current SNF and wants to speak with patient's attending MD before making that decision. CSW agreed to update patient's attending MD of patient's granddaughter's request to speak with MD.  CSW updated patient's attending MD. Attending MD provided cell phone number to give to patient's granddaughter.  CSW contacted patient's granddaughter and provided her with attending MD phone number. CSW agreed to follow up with patient's granddaughter after she speaks with attending MD.  CSW will continue to follow and assist with discharge planning.   Employment status:    Insurance information:    PT Recommendations:    Information / Referral to community resources:     Patient/Family's Response to care:  Patient's granddaughter expressed frustrations about  patient working with PT one day after surgery. Patient's granddaughter concerned about patient's discharge plans.   Patient/Family's Understanding of and Emotional Response to Diagnosis, Current Treatment, and Prognosis:  Patient's granddaughter involved in patient's care and verbalized uncertainty about patient's discharge plans. Patient's granddaughter reported concerns about patient's fall at SNF being undocumented, CSW validated patient's granddaughter concerns. Patient's granddaughter reported that she has spoke with patient's surgeon and wanted to speak with patient's attending MD before making discharge plans, CSW agreed to follow up and assist patient's granddaughter after she speaks with attending MD. Patient's granddaughter reported that she just wanted patient to get good care.   Emotional Assessment Appearance:    Attitude/Demeanor/Rapport:    Affect (typically observed):    Orientation:    Alcohol / Substance use:    Psych involvement (Current and /or in the community):     Discharge Needs  Concerns to be addressed:    Readmission within the last 30 days:    Current discharge risk:    Barriers to Discharge:      Antionette Poles, LCSW 10/02/2017, 11:54 AM

## 2017-10-02 NOTE — Progress Notes (Signed)
Nutrition Brief Note  Patient screened for low Braden. Chart reviewed. She was admitted on 5/10 for sepsis and dehydration.  Pt now transitioning to comfort care.  No further nutrition interventions warranted at this time.  Please re-consult as needed.     Trenton Gammon, MS, RD, LDN, [facility] Inpatient Clinical Dietitian Pager # [phone] After hours/weekend pager # [phone]

## 2017-10-02 NOTE — Progress Notes (Signed)
Patient discharging to [facility].  CSW contacted and informed by [facility] liaison Cheri that all paperwork was received and that they are ready to accept patient.   PTAR contacted, patient's RN reported that report was already given. Packet complete.  CSW updated patient's granddaughter.  CSW signing off, no other needs identified at this time.  Celso Sickle, [place] Clinical Social Worker [facility] Cell#: [phone]

## 2017-10-02 NOTE — Care Management Note (Signed)
Case Management Note  Patient Details  Name: Sherri Leonard MRN: [id number] Date of Birth: March 07, 1921  Subjective/Objective:                    Action/Plan:d/c residential hospice facility.   Expected Discharge Date:  (unknown)               Expected Discharge Plan:  Hospice Medical Facility  In-House Referral:  Clinical Social Work  Discharge planning Services     Post Acute Care Choice:    Choice offered to:     DME Arranged:    DME Agency:     HH Arranged:    HH Agency:     Status of Service:  Completed, signed off  If discussed at [employer], dates discussed:    Additional Comments:  Lanier Clam, RN 10/02/2017, 11:11 AM

## 2017-10-18 DEATH — deceased

## 2019-01-28 IMAGING — CT CT HEAD W/O CM
3 of 6 series · 12 of 47 positions shown, 14 images · non-contrast
Comparison: 04/10/2017

CLINICAL DATA: Altered level of consciousness

EXAM:
CT HEAD WITHOUT CONTRAST
TECHNIQUE: Contiguous axial images were obtained from the base of the skull
through the vertex without intravenous contrast. The examination is
significantly limited by patient motion artifact.

[Series 4: head wo · axial · 0.49mm/px · z∈[+1473,+1608]mm · 7 of 35 slices shown, 9 images]
[im 4/35  brain]
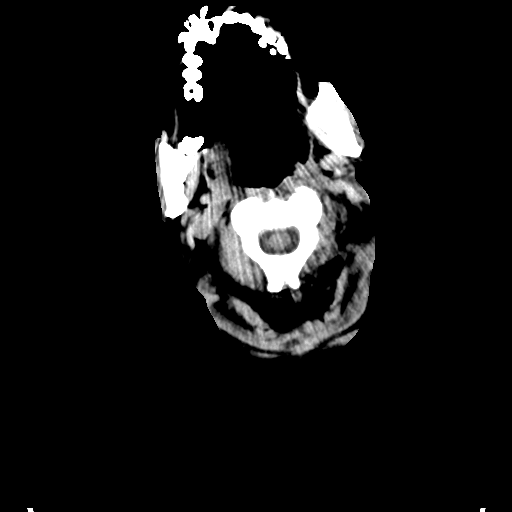
[im 4/35  bone]
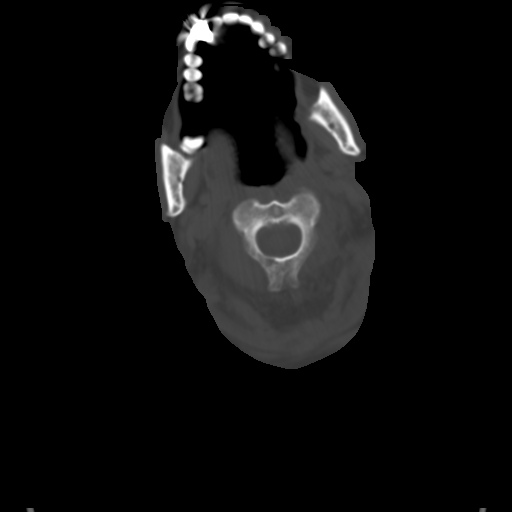
[im 9/35  brain]
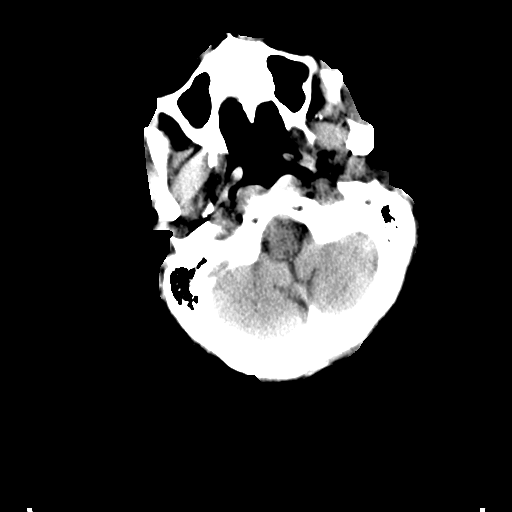
[im 12/35  brain]
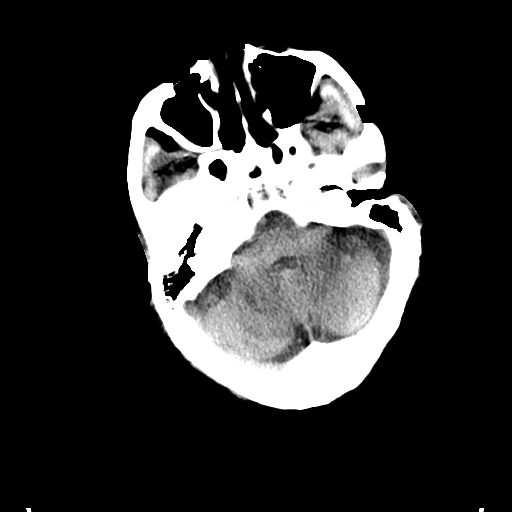
[im 18/35  brain]
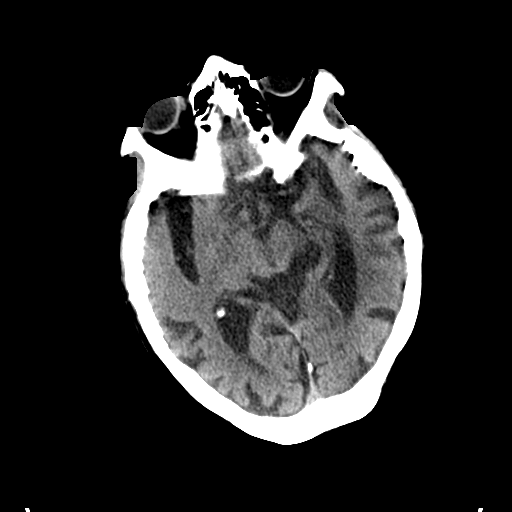
[im 23/35  brain]
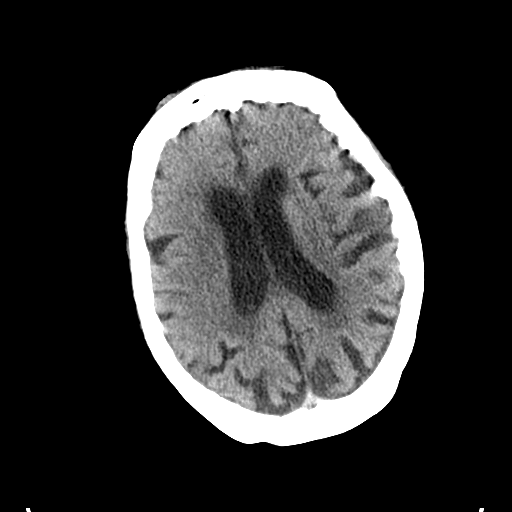
[im 23/35  bone]
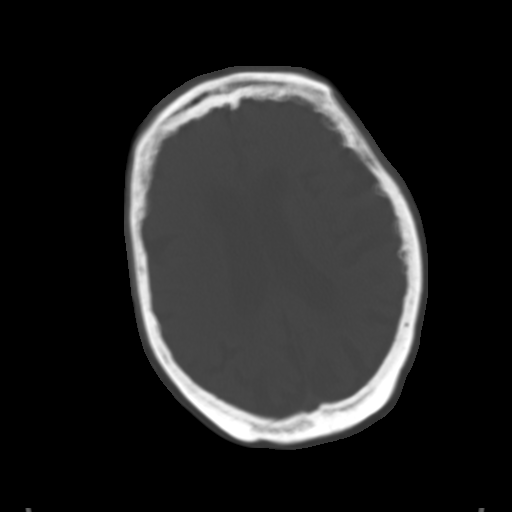
[im 26/35  brain]
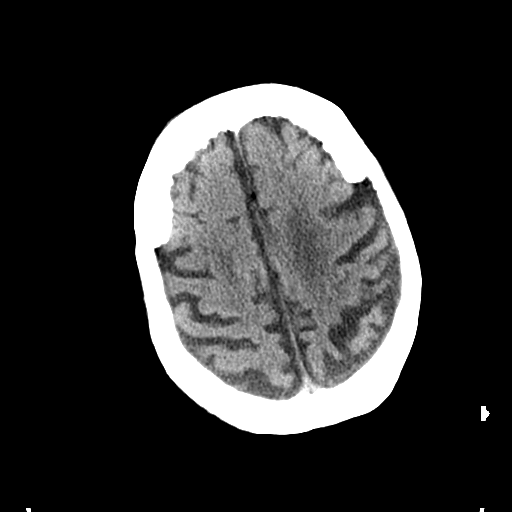
[im 31/35  brain]
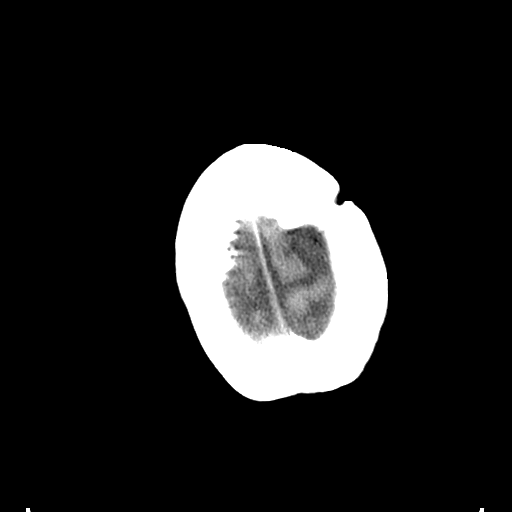

[Series 5: coronal soft tissue · coronal · 0.18mm/px · 3 of 84 slices shown]
[im 47/84  brain]
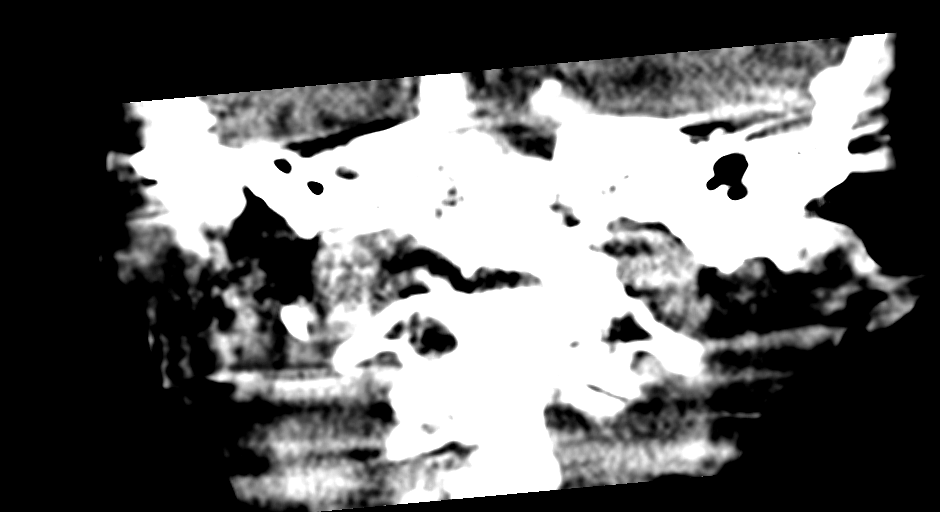
[im 56/84  brain]
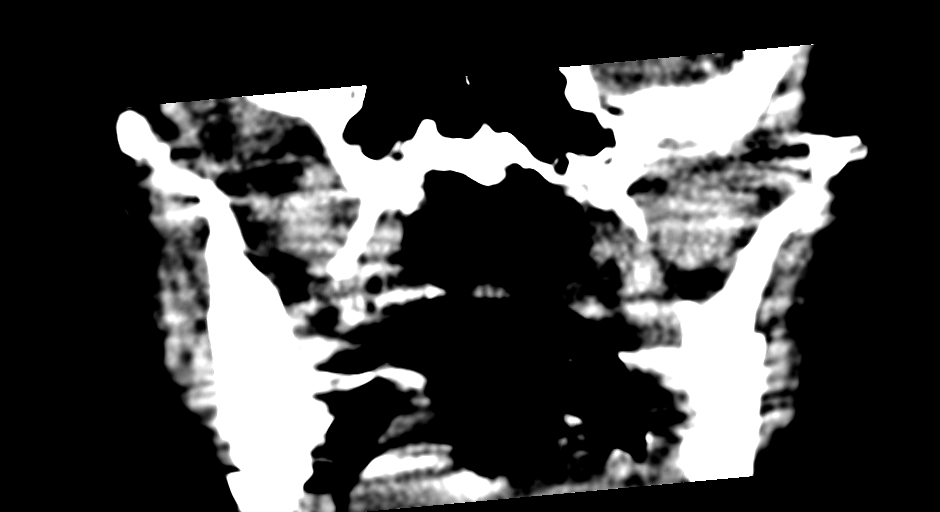
[im 65/84  brain]
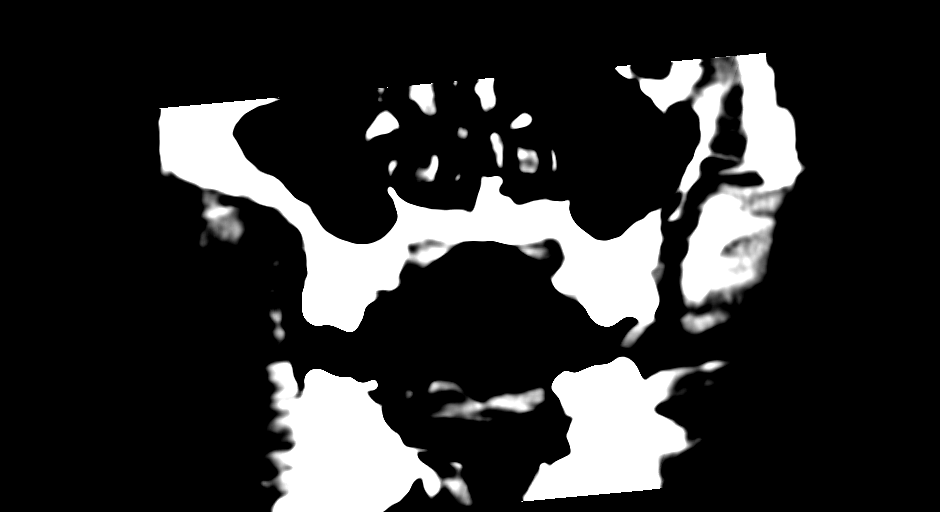

[Series 6: sagittal soft tissue · sagittal · 0.17mm/px · 2 of 60 slices shown]
[im 20/60  brain]
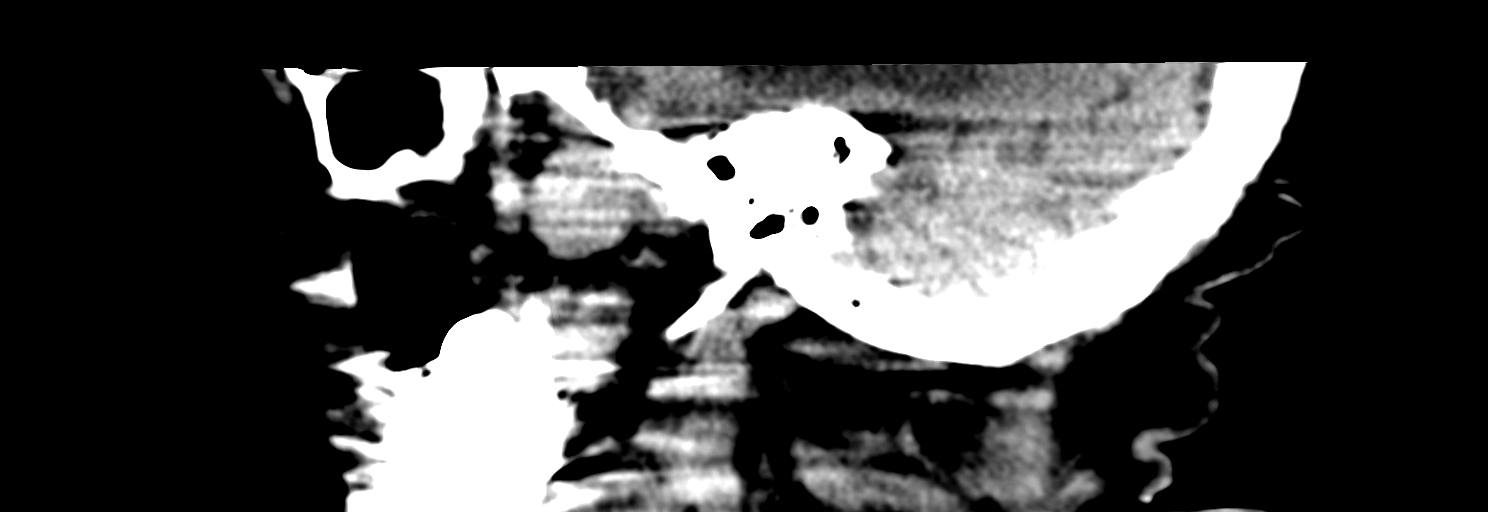
[im 40/60  brain]
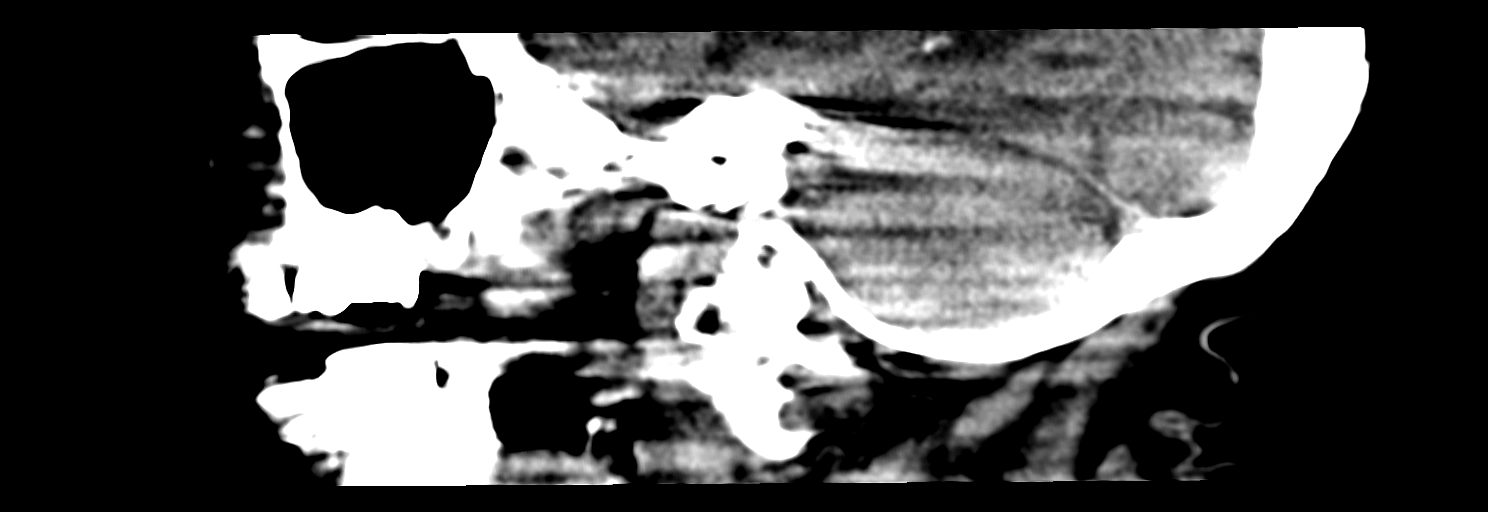

[12 of 47 positions shown; findings below may reference images not displayed]

FINDINGS: Brain: Chronic atrophic and white matter ischemic changes are seen
similar to that noted on the prior exam. No findings to suggest
acute hemorrhage, acute infarction or space-occupying mass lesion
are noted.

Vascular: No hyperdense vessel or unexpected calcification.

Skull: Normal. Negative for fracture or focal lesion.

Sinuses/Orbits: No acute finding.

Other: None.
IMPRESSION: Somewhat limited exam due to patient motion.

Chronic atrophic and ischemic changes without acute abnormality.
